# Patient Record
Sex: Male | Born: 1945
Health system: Southern US, Community
[De-identification: ages and names within clinical notes are randomized; demographics above are authoritative.]

## PROBLEM LIST (undated history)

## (undated) DIAGNOSIS — N400 Enlarged prostate without lower urinary tract symptoms: Secondary | ICD-10-CM

## (undated) DIAGNOSIS — S52502A Unspecified fracture of the lower end of left radius, initial encounter for closed fracture: Secondary | ICD-10-CM

## (undated) DIAGNOSIS — F419 Anxiety disorder, unspecified: Secondary | ICD-10-CM

## (undated) DIAGNOSIS — L309 Dermatitis, unspecified: Secondary | ICD-10-CM

## (undated) DIAGNOSIS — K219 Gastro-esophageal reflux disease without esophagitis: Secondary | ICD-10-CM

## (undated) DIAGNOSIS — G510 Bell's palsy: Secondary | ICD-10-CM

## (undated) DIAGNOSIS — I10 Essential (primary) hypertension: Secondary | ICD-10-CM

## (undated) DIAGNOSIS — K635 Polyp of colon: Secondary | ICD-10-CM

## (undated) DIAGNOSIS — I471 Supraventricular tachycardia, unspecified: Secondary | ICD-10-CM

## (undated) DIAGNOSIS — S61213A Laceration without foreign body of left middle finger without damage to nail, initial encounter: Secondary | ICD-10-CM

## (undated) DIAGNOSIS — G4733 Obstructive sleep apnea (adult) (pediatric): Secondary | ICD-10-CM

## (undated) DIAGNOSIS — B958 Unspecified staphylococcus as the cause of diseases classified elsewhere: Secondary | ICD-10-CM

## (undated) DIAGNOSIS — S42302A Unspecified fracture of shaft of humerus, left arm, initial encounter for closed fracture: Secondary | ICD-10-CM

## (undated) DIAGNOSIS — T847XXA Infection and inflammatory reaction due to other internal orthopedic prosthetic devices, implants and grafts, initial encounter: Secondary | ICD-10-CM

## (undated) DIAGNOSIS — I499 Cardiac arrhythmia, unspecified: Secondary | ICD-10-CM

## (undated) DIAGNOSIS — E785 Hyperlipidemia, unspecified: Secondary | ICD-10-CM

## (undated) DIAGNOSIS — T4145XA Adverse effect of unspecified anesthetic, initial encounter: Secondary | ICD-10-CM

## (undated) DIAGNOSIS — T8859XA Other complications of anesthesia, initial encounter: Secondary | ICD-10-CM

## (undated) DIAGNOSIS — I714 Abdominal aortic aneurysm, without rupture, unspecified: Secondary | ICD-10-CM

## (undated) DIAGNOSIS — S42309A Unspecified fracture of shaft of humerus, unspecified arm, initial encounter for closed fracture: Secondary | ICD-10-CM

## (undated) HISTORY — DX: Unspecified staphylococcus as the cause of diseases classified elsewhere: B95.8

## (undated) HISTORY — DX: Infection and inflammatory reaction due to other internal orthopedic prosthetic devices, implants and grafts, initial encounter: T84.7XXA

## (undated) HISTORY — DX: Supraventricular tachycardia, unspecified: I47.10

## (undated) HISTORY — PX: APPENDECTOMY: SHX54

## (undated) HISTORY — DX: Unspecified fracture of shaft of humerus, left arm, initial encounter for closed fracture: S42.302A

## (undated) HISTORY — DX: Bell's palsy: G51.0

## (undated) HISTORY — DX: Supraventricular tachycardia: I47.1

## (undated) HISTORY — DX: Gastro-esophageal reflux disease without esophagitis: K21.9

## (undated) HISTORY — DX: Benign prostatic hyperplasia without lower urinary tract symptoms: N40.0

## (undated) HISTORY — DX: Unspecified fracture of shaft of humerus, unspecified arm, initial encounter for closed fracture: S42.309A

## (undated) HISTORY — DX: Polyp of colon: K63.5

## (undated) HISTORY — DX: Hyperlipidemia, unspecified: E78.5

## (undated) HISTORY — PX: COLONOSCOPY W/ POLYPECTOMY: SHX1380

## (undated) HISTORY — DX: Obstructive sleep apnea (adult) (pediatric): G47.33

## (undated) HISTORY — DX: Abdominal aortic aneurysm, without rupture, unspecified: I71.40

## (undated) HISTORY — DX: Anxiety disorder, unspecified: F41.9

## (undated) HISTORY — DX: Essential (primary) hypertension: I10

## (undated) HISTORY — DX: Dermatitis, unspecified: L30.9

## (undated) HISTORY — PX: REPLANTATION THUMB: SUR1233

## (undated) HISTORY — DX: Unspecified fracture of the lower end of left radius, initial encounter for closed fracture: S52.502A

## (undated) HISTORY — DX: Laceration without foreign body of left middle finger without damage to nail, initial encounter: S61.213A

## (undated) HISTORY — PX: CHOLECYSTECTOMY: SHX55

---

## 1999-09-06 ENCOUNTER — Encounter: Payer: Self-pay | Admitting: *Deleted

## 1999-09-06 ENCOUNTER — Ambulatory Visit (HOSPITAL_COMMUNITY): Admission: RE | Admit: 1999-09-06 | Discharge: 1999-09-06 | Payer: Self-pay | Admitting: *Deleted

## 2001-08-12 ENCOUNTER — Ambulatory Visit (HOSPITAL_COMMUNITY): Admission: RE | Admit: 2001-08-12 | Discharge: 2001-08-12 | Payer: Self-pay | Admitting: Family Medicine

## 2001-08-12 ENCOUNTER — Encounter: Payer: Self-pay | Admitting: Family Medicine

## 2001-08-20 ENCOUNTER — Encounter: Admission: RE | Admit: 2001-08-20 | Discharge: 2001-08-20 | Payer: Self-pay | Admitting: Family Medicine

## 2001-08-20 ENCOUNTER — Encounter: Payer: Self-pay | Admitting: Family Medicine

## 2001-09-01 ENCOUNTER — Emergency Department (HOSPITAL_COMMUNITY): Admission: EM | Admit: 2001-09-01 | Discharge: 2001-09-01 | Payer: Self-pay | Admitting: Emergency Medicine

## 2001-09-04 ENCOUNTER — Encounter: Payer: Self-pay | Admitting: General Surgery

## 2001-09-05 ENCOUNTER — Encounter: Payer: Self-pay | Admitting: General Surgery

## 2001-09-05 ENCOUNTER — Encounter (INDEPENDENT_AMBULATORY_CARE_PROVIDER_SITE_OTHER): Payer: Self-pay

## 2001-09-06 ENCOUNTER — Encounter: Payer: Self-pay | Admitting: General Surgery

## 2001-09-06 ENCOUNTER — Inpatient Hospital Stay (HOSPITAL_COMMUNITY): Admission: AD | Admit: 2001-09-06 | Discharge: 2001-09-07 | Payer: Self-pay | Admitting: General Surgery

## 2001-09-14 DIAGNOSIS — G510 Bell's palsy: Secondary | ICD-10-CM

## 2001-09-14 HISTORY — DX: Bell's palsy: G51.0

## 2002-12-30 ENCOUNTER — Encounter (INDEPENDENT_AMBULATORY_CARE_PROVIDER_SITE_OTHER): Payer: Self-pay | Admitting: Specialist

## 2002-12-30 ENCOUNTER — Ambulatory Visit (HOSPITAL_COMMUNITY): Admission: RE | Admit: 2002-12-30 | Discharge: 2002-12-30 | Payer: Self-pay | Admitting: Gastroenterology

## 2004-11-18 ENCOUNTER — Ambulatory Visit: Admission: RE | Admit: 2004-11-18 | Discharge: 2004-11-18 | Payer: Self-pay | Admitting: Family Medicine

## 2004-11-23 ENCOUNTER — Observation Stay (HOSPITAL_COMMUNITY): Admission: EM | Admit: 2004-11-23 | Discharge: 2004-11-25 | Payer: Self-pay | Admitting: Emergency Medicine

## 2004-11-23 ENCOUNTER — Encounter (INDEPENDENT_AMBULATORY_CARE_PROVIDER_SITE_OTHER): Payer: Self-pay | Admitting: Specialist

## 2005-11-10 ENCOUNTER — Encounter: Admission: RE | Admit: 2005-11-10 | Discharge: 2005-11-10 | Payer: Self-pay | Admitting: Emergency Medicine

## 2006-11-08 ENCOUNTER — Observation Stay (HOSPITAL_COMMUNITY): Admission: EM | Admit: 2006-11-08 | Discharge: 2006-11-09 | Payer: Self-pay | Admitting: Emergency Medicine

## 2007-03-12 ENCOUNTER — Ambulatory Visit (HOSPITAL_COMMUNITY): Admission: RE | Admit: 2007-03-12 | Discharge: 2007-03-13 | Payer: Self-pay | Admitting: Orthopedic Surgery

## 2009-06-17 ENCOUNTER — Encounter: Admission: RE | Admit: 2009-06-17 | Discharge: 2009-06-17 | Payer: Self-pay | Admitting: Family Medicine

## 2009-09-13 ENCOUNTER — Encounter: Admission: RE | Admit: 2009-09-13 | Discharge: 2009-09-13 | Payer: Self-pay | Admitting: Family Medicine

## 2010-11-29 NOTE — Op Note (Signed)
NAME:  Tanner Garza, [place] NO.:  [id number]   MEDICAL RECORD NO.:  [id number]          PATIENT TYPE:  OIB   LOCATION:  5006                         FACILITY:  [facility]   PHYSICIAN:  Dionne Ano. Gramig III, M.D.DATE OF BIRTH:  Apr 17, 1946   DATE OF PROCEDURE:  DATE OF DISCHARGE:                               OPERATIVE REPORT   PREOPERATIVE DIAGNOSIS:  Saw injury to the right thumb with involvement  of the extensor and flexor tendons, bone, nail plate and nail bed with  significant soft tissue derangement.   POSTOPERATIVE DIAGNOSIS:  Saw injury to the right thumb with involvement  of the extensor and flexor tendons, bone, nail plate and nail bed with  significant soft tissue derangement.   PROCEDURE:  1. Irrigation and debridement of skin, subcutaneous tissue, tendon,      bone, nail bed and nail plate.  This was an excisional debridement      of nonviable tissue.  2. Open reduction and internal fixation of proximal phalanx fracture,      right thumb.  3. Open reduction and internal fixation of distal phalanx fracture,      right thumb.  4. Nail plate removal, right thumb.  5. Nail bed repair, right thumb.  6. Extensor pollicis longus tendon repair, right thumb.  7. Flexor pollicis longus tendon repair, zone 1 of right thumb.  8. Complex soft tissue closure, right thumb.  9. Stress radiography.   SURGEON:  Dominica Severin, MD   ASSISTANT:  Karie Chimera, PA-C   COMPLICATIONS:  None.   ANESTHESIA:  General   TOURNIQUET TIME:  Less than an hour.   ESTIMATED BLOOD LOSS:  Minimal.   INDICATIONS FOR PROCEDURE:  This patient is a 65 year old male who  presents with the above-mentioned diagnosis.  I have counseled him with  regard to the risks and benefits of surgery including the risk of  infection, bleeding, anesthesia, damage to normal structures and failure  of surgery to accomplish its intended goals of relieving symptoms and  restoring function.  With this in  mind, he desires to proceed.  All  questions have been encouraged and answered preoperatively.   OPERATIVE FINDINGS:  The patient had massive injury to the proximal and  distal phalanx of the right thumb, severe tendon injury, soft tissue  derangement, volar plate injury and disarray of the soft tissue.  He  required nail plate removal, nail bed repair, flexor and extensor  reconstruction and ORIF of the distal and proximal phalanx.  He  fortunately maintained via blood flow.   OPERATION IN DETAIL:  The patient was seen by myself and Anesthesia.  He  was consented.  Arm was marked and permit signed.  Antibiotics were  given.  He was then taken to the procedural suite and underwent smooth  induction of general anesthesia.  Following this, he was prepped and  draped in the usual sterile fashion with Betadine scrub and paint.  Once  this done, I then performed elevation of the arm and removed tacking  sutures which had been previously placed at an outside facility.  Once  this was  done, I then performed excision of skin, subcutaneous tissue,  bone, tendon and associated soft tissues.  At this time, I performed a  nail plate removal atraumatically with Therapist, nutritional.  This was removed  to my satisfaction without difficulty.  Following this, an excisional  debridement of structures was accomplished with 4 liters of saline.  Nonviable tissue was removed.  He had severe interarticular injury with  distal phalanx fracture and proximal phalanx fracture.  I curettaged the  proximal phalanx and treated this aggressively with I&D.  I also  debrided the distal phalanx and treat this aggressively also with I&D.  The patient had the flexor pollicis longus tendon retrieved and tagged  with FiberWire suture for repair.  Following this and placing 4 liters  of fluid in the area, I then performed re-creation of the bony anatomy.  Three 0.035 K-wires were preplaced and following this I then performed   ORIF of the intra-articular fracture about the distal phalanx.  This was  done with 0.035 K-wires x2 and achieved adequate purchase.  Following  this, attention was turned towards the middle phalanx which was treated  with Kirschner wire fixation as well.  I placed longitudinal Kirschner  wire engaging the proximal phalanx which also harnessed the distal  phalanx.  This immobilized the DIP joint.  Thus, both the proximal and  distal phalanx fractures were treated with pinning and stabilization.   At this time, I then turned attention towards the extensor apparatus.  The EPL was repaired with running and interrupted 4-0 FiberWire suture.  This reapproximated nicely.   Following this, I then performed flexor pollicis longus tendon repair to  the bone.  I had previously made bony drill holes and pre-tagged a 4-0  FiberWire suture which was placed through the bony tunnels.  The patient  tolerated this well.  The FPL, EPL and bony architecture was nicely  repaired.   Following this, I then performed repair of the sterile and germinal  matrix with fine chromic suture of the 6-0 and 7-0 variety.  The patient  tolerated this well.   Following this, I then performed a complex skin repair.  The dorsal and  volar skin was reapproximated with Prolene suture and this was a very  complex laceration closure.   Final copy x-rays looked excellent in the AP and lateral plane.  I was  pleased with this.   Stress radiography was performed revealing excellent position of all  structures.  I was pleased with this and the findings.  There were no  complicating features with his surgery.  The patient was dressed with  Adaptic, Xeroform gauze and finger splint as well as a small plaster of  Paris splint.  He tolerated the procedure well.  Marcaine was placed for  postop analgesia.  He will be monitored and continued as an inpatient in  the hospital.  We will plan for IV antibiotics, pain medicine, etc.   At  time of first postop visit, we will need AP and lateral x-rays and  referral with therapy for removable orthosis.  All questions have been  encouraged and answered.           ______________________________  Dionne Ano. Everlene Other, M.D.     Nash Mantis  D:  03/12/2007  T:  03/13/2007  Job:  [id number]

## 2010-12-02 NOTE — H&P (Signed)
NAME:  Tanner Garza, Tanner Garza             ACCOUNT NO.:  [id number]   MEDICAL RECORD NO.:  [id number]          PATIENT TYPE:  EMS   LOCATION:  MAJO                         FACILITY:  [facility]   PHYSICIAN:  Kela Millin, M.D.DATE OF BIRTH:  10/29/1945   DATE OF ADMISSION:  11/08/2006  DATE OF DISCHARGE:                              HISTORY & PHYSICAL   CHIEF COMPLAINT:  Chest pain.   HISTORY OF PRESENT ILLNESS:  The patient is a 65 year old white male  with past medical history significant for hypertension and tobacco abuse  who presents with complaints of chest pain.  He states that he was in  his usual state of health until this morning while at work when he began  experiencing chest pain.  He states that it was more like his heart  was thumping out of his chest, 4/10 in intensity, and lasted about 30  minutes.  He felt very dizzy thereafter and also with questionable  diaphoresis.  He says that for the most part the pain was radiating down  his left arm.  He denies nausea, vomiting, shortness of breath, and it  was non exertional.  He states that he had worn a Holter monitor for 21  days and he was told by his primary care physician that it did not show  any arrhythmias.  He does not know which cardiologist set him up for the  monitoring.  He denies cough, fevers, dysuria, abdominal pain, diarrhea,  melena and no hematochezia.   The patient was seen in the ER and point of care markers were negative.  EKG showed sinus tachycardia with T wave inversions in the inferior  leads.  A D-Dimer was done and noted to be elevated at 1.10.  A CT  angiogram ordered and pending at the time of this dictation.  He is  admitted to [facility] [place] for further evaluation and  management.   PAST MEDICAL HISTORY:  1. Stated above.  2. BPH.  3. Chronic dermatitis on extremities.   PAST SURGICAL HISTORY:  Status post appendectomy.   MEDICATIONS:  1. Avodart 0.5 mg daily.  2. Aspirin.  3.  Benicar - patient does not know the dose.  4. He does not recall the other medicine that he takes.   ALLERGIES:  No known drug allergies.   SOCIAL HISTORY:  Tobacco - one-half pack per day.  He denies alcohol.   FAMILY HISTORY:  One of his brothers had an MI at age 90.  He is  deceased of stomach cancer.  His father is deceased, had colon cancer  and dementia.  His mother has dementia.   REVIEW OF SYSTEMS:  As per HPI.  Other review of systems negative.   PHYSICAL EXAMINATION:  GENERAL APPEARANCE:  The patient is an obese,  older white male, pleasant, alert and appropriate in no apparent  distress.  VITAL SIGNS:  Temperature 98 with blood pressure 134/79, pulse 103,  respiratory rate 16, O2 sat is 96%.  HEENT:  PERRL.  EOMI.  Sclerae anicteric.  Moist mucous membranes.  No  oral exudates.  NECK:  Supple.  No adenopathy.  No thyromegaly.  No carotid bruits  appreciated.  CHEST:  Lungs with decreased breath sounds at the bases, otherwise clear  to auscultation.  CARDIOVASCULAR:  Regular rate and rhythm.  Normal S1, S2.  ABDOMEN:  Obese.  Bowel sounds present, nontender, nondistended, soft,  no masses palpable.  No organomegaly appreciated.  EXTREMITIES:  He has a papular pinpoint erythematous rash on the  anterior aspect of his lower extremities bilaterally and a similar area  on the lateral aspect of his right shoulder area.  (This is chronic.)  NEUROLOGIC:  Alert and oriented x3.  Cranial nerves II-XII grossly  intact.  Nonfocal exam.   LABORATORY DATA:  Sodium 138, potassium 4.2, chloride 105, BUN 25,  glucose 110.  His pH is 7.31, bicarbonate 27.4.  Hemoglobin 16.3,  hematocrit 48, white cell count 5.3, platelet count 202, neutrophil  count 77%.  Point of care markers are negative.  His creatinine is 1.2.  D-Dimer is 1.10.   ASSESSMENT/PLAN:  1. Chest pain - as discussed above, risk factors include age, obesity,      hypertension, tobacco abuse.  Will obtain serial cardiac  enzymes      and a fasting lipid profile.  Will place the patient on aspirin,      nitrates.  As noted above, he has had Holter monitoring done      outpatient.  Will follow and consult cardiology pending cardiac      enzymes as well as a CT angiogram ordered.  Will place on PPI to      cover for GI.  Will continue Benicar.  2. Hypertension - as above, continue Benicar.  3. Tobacco abuse - smoking cessation consult.  4. Benign prostatic hypertrophy - continue Avodart.      Kela Millin, M.D.  Electronically Signed     ACV/MEDQ  D:  11/08/2006  T:  11/08/2006  Job:  [id number]   cc:   Holley Bouche, M.D.

## 2010-12-02 NOTE — Op Note (Signed)
[place]  Patient:    Tanner Garza, Tanner Garza Visit Number: [id number] MRN: [id number]          Service Type: EMS Location: [facility] Attending Physician:  Hanley Seamen Dictated by:   Angelia Mould. Derrell Lolling, M.D. Proc. Date: 09/05/01 Admit Date:  09/01/2001 Discharge Date: 09/01/2001   CC:         Cheree Ditto, M.D.  Arvella Merles, M.D.   Operative Report  PREOPERATIVE DIAGNOSIS:  Chronic cholecystitis with cholelithiasis.  POSTOPERATIVE DIAGNOSIS:  Acute cholecystitis with cholelithiasis, umbilical hernia.  OPERATION PERFORMED: 1. Laparoscopic cholecystectomy with intraoperative cholangiogram. 2. Umbilical herniorrhaphy.  SURGEON:  Angelia Mould. Derrell Lolling, M.D.  ASSISTANT:  Anselm Pancoast. Zachery Dakins, M.D.  ANESTHESIA:  INDICATIONS FOR PROCEDURE:  The patient is a 65 year old white male with a several month history of reflux abdominal bloating.  Last week he awoke with moderately severe epigastric pain which lasted about 2 hours.  He was evaluated, had an ultrasound which showed a small contracted gallbladder containing stones with wall thickening suggesting acute inflammation.  Common bile duct was not dilated.  CBC and complete metabolic panel were normal last week.  He was scheduled for surgery.  He came to the hospital this morning with a temperature of 100.1 and his liver function tests were now elevated with total bilirubin of 2.0 and elevated enzymes.  He still was having no pain and was not tender.  He was operated upon semielectively.  OPERATIVE FINDINGS:  The gallbladder was very thick-walled, acutely inflamed and with extensive adhesions to the fundus, body and infundibulum of the gallbladder.  The cystic duct itself was very tiny but patent.  The cholangiogram was normal.  There was normal intrahepatic and extrahepatic bile ducts, no filling defect, no dilatation, and prompt flow of contrast into the duodenum.  The liver otherwise  appeared healthy.  Stomach and duodenum, small intestine and large intestine were grossly normal.  The patient had an umbilical hernia with a defect about 2 cm that was encountered as we were making our umbilical trocar site and this required repair at the end of the case.  This added about 15 or 20 minutes to the operation.  DESCRIPTION OF PROCEDURE:  Following the induction of general endotracheal anesthesia, the patients abdomen was prepped and draped in sterile fashion. 0.25% Marcaine with epinephrine was used as a local infiltration anesthetic. A transverse incision was made at the superior rim of the umbilicus. Dissection was carried down into subcutaneous tissues.  We encountered umbilical hernia with a defect in the fascia about 2 cm.  We were able to enter the peritoneal cavity under direct vision.  We inserted a 10 mm Hasson trocar and got a good air seal with a pursestring suture of 2-0 Vicryl suture. Pneumoperitoneum was created.  Video camera was inserted with visualization and with the findings as described above.  A 10 mm trocar was placed in the subxiphoid region and two 5 mm trocars placed in the right midabdomen.  We were able to peel some of the adhesions off the fundus of the gallbladder and then we were able to put a suction trocar into the fundus and aspirate some of the bile so the gallbladder would partially collapse and allow [place] to secure it with a grasper.  We then stripped the rest of the adhesions off slowly and carefully until we identified the infundibulum of the gallbladder.  We dissected out the cystic duct.  We inserted a cholangiogram catheter into the  cystic duct.  A cholangiogram was obtained using the C-arm and that was normal.  The bile ducts were not dilated.  There was normal anatomy.  No filling defect.  Prompt flow of contrast into the duodenum.  The cystic duct catheter was removed, the cystic duct secured with metal clips and divided. The cystic  artery was isolated, clipped and divided.  The gallbladder was then carefully dissected from its bed with electrocautery.  This was a somewhat tedious dissection because of the acute inflammation and thickening of the gallbladder.  Nevertheless we got the gallbladder completely removed, placed in a specimen bag and removed through the umbilical port.  The operative field was copiously irrigated with 2000 cc of saline.  Hemostasis was excellent.  At the completion of the case there was no bleeding and no bile leak whatsoever. The irrigation fluid was clear.  We removed the trocars under direct vision and there was no bleeding from the trocar sites.  The pneumoperitoneum was released.  To repair the umbilical hernia, we took out the Vicryl suture.  We extended the incision transversely until it was almost about 6 or 7 cm.  Subcutaneous tissue was debrided away from the anterior abdominal wall fascia.  The umbilicus was elevated until we had a good rim of fascia all the way around. The fascia was then closed transversely with five interrupted sutures of 0 Novofil.  The wound was irrigated with saline.  The repair appeared secure. The skin was closed with running subcuticular sutures of 4-0 Vicryl and Steri-Strips.  All other trocar sites were closed with subcuticular sutures of 4-0 Vicryl and Steri-Strips.  Clean bandages were placed.  The patient was taken to the recovery room in stable condition.  Estimated blood loss was about 30 to 40 cc.  Complications were none.  Sponge, needle and instrument counts were correct. Dictated by:   Angelia Mould. Derrell Lolling, M.D. Attending Physician:  Hanley Seamen DD:  09/05/01 TD:  09/05/01 Job: [id number]

## 2010-12-02 NOTE — Op Note (Signed)
   NAME:  Tanner Garza, Tanner Garza                       ACCOUNT NO.:  [id number]   MEDICAL RECORD NO.:  [id number]                   PATIENT TYPE:  AMB   LOCATION:  ENDO                                 FACILITY:  [facility]   PHYSICIAN:  Petra Kuba, M.D.                 DATE OF BIRTH:  1945-09-23   DATE OF PROCEDURE:  12/30/2002  DATE OF DISCHARGE:                                 OPERATIVE REPORT   PROCEDURE:  Esophagogastroduodenoscopy.   INDICATIONS:  Upper tract symptoms helped by pump inhibitors.  Want to do a  one time endoscopy to confirm a diagnosis, rule out Barrett's.  Consent was  signed after risks, benefits, methods, options were thoroughly discussed in  the office.   MEDICATIONS:  No additional medicines were used since this followed a  colonoscopy procedure.   DESCRIPTION OF PROCEDURE:  The video endoscope was inserted by direct  vision.  The proximal, mid and distal esophagus were normal.  In the distal  esophagus a small to medium hiatal hernia was seen. The scope was inserted  into the stomach, advanced through a normal antrum, normal pylorus into a  normal duodenal bulb and on to a normal second portion of the duodenum.  The  scope was withdrawn back to the bulb and a good look there ruled out ulcer  in that location.  Stomach was evaluated on retroflexion and straight  visualization and other than the hiatal hernia being confirmed in the  cardia, no abnormalities were seen.  Air was suctioned.  The scope was  slowly withdrawn and again a good look at the esophagus on slow withdrawal  was normal.  The scope was removed.  The patient tolerated the procedure  well.  There was no obvious immediate complication.   ENDOSCOPIC DIAGNOSES:  1. Small to medium hiatal hernia.  2. Otherwise normal esophagogastroduodenoscopy.   PLAN:  Continue pump inhibitors since they are helping.  Return care to Dr.  Tiburcio Pea.  Please see colonoscopy dictation for other workup plans and  recommendations.                                                Petra Kuba, M.D.    MEM/MEDQ  D:  12/30/2002  T:  12/30/2002  Job:  [id number]   cc:   Holley Bouche, M.D.  [address]  [place], [place] [address]  Fax: [phone]

## 2010-12-02 NOTE — H&P (Signed)
NAME:  Tanner Garza, Tanner Garza NO.:  [id number]   MEDICAL RECORD NO.:  [id number]          PATIENT TYPE:  EMS   LOCATION:  ED                           FACILITY:  [facility] [place]   PHYSICIAN:  Angelia Mould. Derrell Lolling, M.D.DATE OF BIRTH:  February 05, 1946   DATE OF ADMISSION:  11/23/2004  DATE OF DISCHARGE:                                HISTORY & PHYSICAL   CHIEF COMPLAINT:  Right lower quadrant pain.   HISTORY OF PRESENT ILLNESS:  This is a 65 year old white male who was  feeling reasonably well until late last night when he noticed some heaviness  and bloating in his lower abdomen.  He had some vague lower abdominal pain.  Mild nausea.  Did not vomit.  Has no fever.  Has not had any diarrhea.  He  states that this morning, the pain became more intense in the right lower  quadrant but has not been progressive throughout the day.  He had a bowel  movement today.  He took some milk of magnesia at 7:00 a.m., but that did  not make him feel better.  He is voiding normally.  He has not had any prior  similar problems.   He saw Dr. Holley Bouche.  He was sent to [facility].  A CT  scan was done.  By verbal report, this was consistent with early  appendicitis, but the diagnosis was not completely clear.  No other  abnormalities were seen.  He was sent to the [facility] ER for my  evaluation.   PAST MEDICAL HISTORY:  Laparoscopic cholecystectomy in 2002 by me.  Right  leg swelling one week ago.  He says the duplex scan was negative.  The cause  of his right leg swelling was unknown but has been mild.  He had an upper  endoscopy and colonoscopy in 2003.  He had a few colon polyps.  He is  scheduled to have this repeated in the next 12 months.  Hypertension.  Benign prostatic hypertrophy.  Hyperlipidemia.  Gastroesophageal reflux  disease.   CURRENT MEDICATIONS:  1.  Atenolol 50 mg p.o. b.i.d.  2.  Protonix 40 mg a day.  3.  Flomax 0.4 mg a day.  4.  Avodart 0.5 mg a day for  BPH.   DRUG ALLERGIES:  TELEPAQUE, otherwise negative.   SOCIAL HISTORY:  The patient is married.  Has one child.  Lives in [place].  He is a Copywriter, advertising, making false teeth.  He smokes one-half pack of  cigarettes per day.  He denies the use of alcohol.   FAMILY HISTORY:  Mother living.  Has dementia.  Father deceased.  Had  dementia and colon cancer.  Two brothers, one had an MI.   REVIEW OF SYSTEMS:  A 15-system review of systems is performed.  It is  noncontributory except as described above.   PHYSICAL EXAMINATION:  VITAL SIGNS:  Temp 97.9, pulse 86, respirations 20,  blood pressure 148/97.  GENERAL:  A pleasant middle-aged man in mild-to-moderate distress.  HEENT:  Eyes:  Sclerae are clear.  Extraocular movements are intact.  Ears,  nose, mouth,  throat, lips, tongue and oropharynx without gross lesions.  His  tongue is staying brown, presumably from tobacco.  NECK:  Soft and nontender.  No mass.  No jugular venous distention.  LUNGS:  Clear to auscultation.  No CVA tenderness.  HEART:  Regular rate and rhythm.  Radial, femoral, and dorsalis pedis pulses  are palpable bilaterally.  I really do not notice any peripheral edema.  ABDOMEN:  He has exquisite tenderness and guarding in the right lower  quadrant and is fairly soft.  Bowel sounds are hypoactive.  Well-healed  trocar sites.  Not distended.  The liver and spleen do not appear enlarged.  EXTREMITIES:  He moves all four extremities without pain or deformity.  NEUROLOGIC:  No gross motor or sensory deficits.   ADMISSION DATA:  I reviewed the CT scan with Dr. Elie Goody.  There is a  slight inflammatory change in the right lower quadrant, but it is not  clearly the appendix.  It may be an infarcted appendix epiploica.  It may be  early appendicitis but is not diagnostic.   LAB WORK:  A white blood cell count of 14,200, hemoglobin 15.8.  Complete  metabolic panel is entirely normal except for a glucose of 133.   Urinalysis  is entirely normal.   ASSESSMENT:  1.  Right lower quadrant pain:  Given the history, the degree of tenderness,      leukocytosis, and CT scan findings, I think the most likely diagnosis is      appendicitis, although other possibilities, including infarcted appendix      epiploica, diverticulitis, or other inflammatory bowel disease has to be      considered.  2.  Tobacco abuse.  3.  Hypertension.  4.  Benign prostatic hypertrophy.   PLAN:  1.  The patient will be taken to the operating room for diagnostic      laparoscopy and laparoscopic appendectomy.  2.  I have discussed the indications and details of the surgery with the      patient and his wife.  Risks and complications have been outlined and      include but are not limited to bleeding, infection, conversion to open      laparotomy, injury to adjacent organs such as the ureter, intestines, or      bladder with major reconstructive surgery, wound problems such as      infection or hernia, cardiac, pulmonary, and thromboembolic problems.      He seems to understand these issues well.  At this time, all of his      questions are answered.  He is in full agreement with this plan.      HMI/MEDQ  D:  11/23/2004  T:  11/23/2004  Job:  [id number]   cc:   Holley Bouche, M.D.  [address]  [place], [place] [address]  Fax: [phone]

## 2010-12-02 NOTE — Op Note (Signed)
NAME:  Tanner Garza, CUBERO                       ACCOUNT NO.:  [id number]   MEDICAL RECORD NO.:  [id number]                   PATIENT TYPE:  AMB   LOCATION:  ENDO                                 FACILITY:  [facility]   PHYSICIAN:  Petra Kuba, M.D.                 DATE OF BIRTH:  1946/02/03   DATE OF PROCEDURE:  12/30/2002  DATE OF DISCHARGE:                                 OPERATIVE REPORT   PROCEDURE:  Colonoscopy with polypectomy.   INDICATIONS:  Family history of colon cancer.  Due for colonic screening.   MEDICINES USED:  Demerol 80 mg, Versed 8 mg.   Consent was signed after risks, benefits, methods, and options thoroughly  discussed in the office.   PROCEDURE:  Rectal inspection was pertinent for external hemorrhoids.  Digital exam was negative.  The video colonoscope was inserted, easily  advanced around the colon to the cecum.  This did require some abdominal  pressure but no position changes.  No obvious abnormality was seen on  insertion.  The cecum was identified by the appendiceal orifice and the  ileocecal valve.  The scope was slowly withdrawn.  Prep was adequate.  There  was some liquid stool that required washing and suctioning.  On slow  withdrawal through the colon the cecum and the ascending were normal.  In  the hepatic flexure a small polyp was seen and was hot biopsied x1.  The  scope was further withdrawn.  Beginning at the splenic flexure and along the  left side, a few small polyps were seen.  Four were hot biopsied, two were  snared.  They included a hot biopsy in the rectum, both snares, I think, in  the sigmoid, and hot biopsies in both the sigmoid and the descending.  The  polyps that were snared were suctioned through the scope and collected in  the trap, and they were all put in a second container.  Once back in the  rectum the scope was then retroflexed, pertinent for some internal  hemorrhoids.  Anorectal pull-through confirmed the above.  The scope  was  straightened and reinserted a short way up the left side of the colon, air  was suctioned, and the scope removed.  The patient tolerated the procedure  well.  There was no obvious immediate complication.   ENDOSCOPIC DIAGNOSES:  1. Internal-external hemorrhoids.  2. Question of small hepatic flexure polyp, hot biopsied and put in     container #1.  3. Multiple small rectal, sigmoid, and descending polyps with two snares and     multiple hot biopsies, put in the second container.  4. Otherwise within normal limits to the cecum.    PLAN:  1. Await pathology to determine future colonic screening.  2. Happy to see back p.r.n., otherwise recommend yearly rectals and guaiacs     per Dr. Tiburcio Pea and continue workup with a one-time EGD for his  long-     standing upper tract symptoms.                                               Petra Kuba, M.D.    MEM/MEDQ  D:  12/30/2002  T:  12/30/2002  Job:  [id number]   cc:   Holley Bouche, M.D.  [address]  [place], [place] [address]  Fax: [phone]

## 2010-12-02 NOTE — Op Note (Signed)
NAME:  Tanner Garza, Tanner Garza             ACCOUNT NO.:  [id number]   MEDICAL RECORD NO.:  [id number]          PATIENT TYPE:  INP   LOCATION:  0104                         FACILITY:  [facility]   PHYSICIAN:  Angelia Mould. Derrell Lolling, M.D.DATE OF BIRTH:  May 25, 1946   DATE OF PROCEDURE:  11/23/2004  DATE OF DISCHARGE:                                 OPERATIVE REPORT   PREOPERATIVE DIAGNOSIS:  Acute appendicitis.   POSTOPERATIVE DIAGNOSIS:  Acute appendicitis.   OPERATION PERFORMED:  Laparoscopic appendectomy.   SURGEON:  Angelia Mould. Derrell Lolling, M.D.   OPERATIVE INDICATIONS:  This is a 65 year old white male who developed lower  abdominal heaviness and bloating last night.  Today, he has had more intense  pain in the right lower quadrant.  He has been a little bit nauseated but no  vomiting.  No fever.  No chills.  No diarrhea.  No prior similar history.  He saw Dr. Tiburcio Pea.  A CT scan was performed at [facility].  The  CT scan was read as possible early appendicitis.  I have reviewed the films,  and it is not clear, although there might be a slight inflammatory process  in the right lower quadrant.   On examination, he is found to have exquisite localized tenderness in the  right lower quadrant.  His white blood cell count is elevated to 14,200.  I  felt clinically the most likely diagnosis was appendicitis, and he is  brought to the operating room urgently for laparoscopy.   OPERATIVE FINDINGS:  The patient had severe, acute appendicitis.  The  appendix was necrotic.  There was no abscess or obvious perforation, but on  manipulating the appendix, it did rupture, spilling some purulent fluid.  The terminal ileum and cecum looked normal.   OPERATIVE TECHNIQUE:  Following the induction of general endotracheal  anesthesia, a Foley catheter was inserted.  Intravenous antibiotics were  given.  The patient's abdomen was prepped and draped in a sterile fashion.  Marcaine 0.5% with epinephrine was  used as a local infiltration anesthetic.  A 12 mm Optiview port was inserted in the left rectus sheath above the  umbilicus without any difficulty.  Pneumoperitoneum was created.  Video  camera was inserted with visualization and findings as described above.  A 5  mm trocar was placed in the left rectus sheath below the umbilicus, and a 12  mm port placed in the suprapubic area.  The patient was positioned in  Trendelenburg position and tilted to the left.  We were able to slowly  mobilize the small bowel off of the appendix and then get an Endoloop tie  around the appendix.  We divided some of the lateral peritoneal attachments  to help mobilize the appendix and the cecum medial.  We could then better  visualize the appendiceal mesentery, and we divided the mesentery of the  appendix with the harmonic scalpel in small steps until we could see exactly  where the appendix inserted onto the base of the cecum.  At this point, we  transected the appendix at the base of the cecum with an Endo-GIA stapling  device.  We placed a stapler perpendicular to the appendix across the base  of the cecum, taking just a little bit of the cecal wall with it.  Closed  the stapler, held it in place for about 45 seconds, and removed it.  This  provided a nice, secure staple line that was hemostatic.  There was no  bleeding.  The appendix was placed in a specimen bag and removed.   We then spent a great deal of time irrigating, perhaps 2000 cc out of the  pelvis and the right pericolic gutter, just to be sure that we did not leave  any particulate matter.  At the completion of this irrigation procedure,  everything looked perfectly clear, and there was no bleeding anywhere.  The  trocars were removed under direct vision.  There was no bleeding from the  trocar sites.  The pneumoperitoneum was released.  The fascia in the left  rectus sheath was closed with interrupted figure-of-eight sutures of 0  Vicryl.  The  bleeders were irrigated with saline, and the skin closed with  subcuticular sutures of 4-0 Monocryl and Steri-Strips.  Clean bandages were  placed, and the patient was taken to the recovery room in stable condition.  Estimated blood loss was about 25-30 cc.  Complications were none.  Sponge,  needle, and instrument counts were correct.      HMI/MEDQ  D:  11/23/2004  T:  11/23/2004  Job:  [id number]   cc:   Holley Bouche, M.D.  [address]  [place], [place] [address]  Fax: [phone]

## 2011-04-28 LAB — URINALYSIS, ROUTINE W REFLEX MICROSCOPIC
Bilirubin Urine: NEGATIVE
Glucose, UA: NEGATIVE
Hgb urine dipstick: NEGATIVE
Specific Gravity, Urine: 1.021
Urobilinogen, UA: 1
pH: 6

## 2011-04-28 LAB — CBC
MCV: 95.3
RBC: 4.28
RDW: 13.5

## 2011-04-28 LAB — BASIC METABOLIC PANEL
BUN: 16
CO2: 27
Chloride: 103
Creatinine, Ser: 0.91
Glucose, Bld: 105 — ABNORMAL HIGH
Potassium: 4
Sodium: 139

## 2011-10-04 DIAGNOSIS — H40009 Preglaucoma, unspecified, unspecified eye: Secondary | ICD-10-CM | POA: Diagnosis not present

## 2011-10-06 DIAGNOSIS — N400 Enlarged prostate without lower urinary tract symptoms: Secondary | ICD-10-CM | POA: Diagnosis not present

## 2011-10-06 DIAGNOSIS — K219 Gastro-esophageal reflux disease without esophagitis: Secondary | ICD-10-CM | POA: Diagnosis not present

## 2011-10-06 DIAGNOSIS — E785 Hyperlipidemia, unspecified: Secondary | ICD-10-CM | POA: Diagnosis not present

## 2011-10-06 DIAGNOSIS — M722 Plantar fascial fibromatosis: Secondary | ICD-10-CM | POA: Diagnosis not present

## 2011-10-06 DIAGNOSIS — F411 Generalized anxiety disorder: Secondary | ICD-10-CM | POA: Diagnosis not present

## 2011-10-06 DIAGNOSIS — I10 Essential (primary) hypertension: Secondary | ICD-10-CM | POA: Diagnosis not present

## 2011-10-10 DIAGNOSIS — F4310 Post-traumatic stress disorder, unspecified: Secondary | ICD-10-CM | POA: Diagnosis not present

## 2011-10-10 DIAGNOSIS — F40298 Other specified phobia: Secondary | ICD-10-CM | POA: Diagnosis not present

## 2011-11-29 DIAGNOSIS — M722 Plantar fascial fibromatosis: Secondary | ICD-10-CM | POA: Diagnosis not present

## 2012-01-26 DIAGNOSIS — M722 Plantar fascial fibromatosis: Secondary | ICD-10-CM | POA: Diagnosis not present

## 2012-03-08 DIAGNOSIS — M722 Plantar fascial fibromatosis: Secondary | ICD-10-CM | POA: Diagnosis not present

## 2012-03-15 DIAGNOSIS — Z0000 Encounter for general adult medical examination without abnormal findings: Secondary | ICD-10-CM | POA: Diagnosis not present

## 2012-03-15 DIAGNOSIS — Z125 Encounter for screening for malignant neoplasm of prostate: Secondary | ICD-10-CM | POA: Diagnosis not present

## 2012-03-15 DIAGNOSIS — F411 Generalized anxiety disorder: Secondary | ICD-10-CM | POA: Diagnosis not present

## 2012-03-15 DIAGNOSIS — I10 Essential (primary) hypertension: Secondary | ICD-10-CM | POA: Diagnosis not present

## 2012-03-15 DIAGNOSIS — Z23 Encounter for immunization: Secondary | ICD-10-CM | POA: Diagnosis not present

## 2012-03-15 DIAGNOSIS — E785 Hyperlipidemia, unspecified: Secondary | ICD-10-CM | POA: Diagnosis not present

## 2012-03-15 DIAGNOSIS — K219 Gastro-esophageal reflux disease without esophagitis: Secondary | ICD-10-CM | POA: Diagnosis not present

## 2012-03-15 DIAGNOSIS — Z1159 Encounter for screening for other viral diseases: Secondary | ICD-10-CM | POA: Diagnosis not present

## 2012-03-15 DIAGNOSIS — N400 Enlarged prostate without lower urinary tract symptoms: Secondary | ICD-10-CM | POA: Diagnosis not present

## 2012-03-29 DIAGNOSIS — H40059 Ocular hypertension, unspecified eye: Secondary | ICD-10-CM | POA: Diagnosis not present

## 2012-03-29 DIAGNOSIS — H02839 Dermatochalasis of unspecified eye, unspecified eyelid: Secondary | ICD-10-CM | POA: Diagnosis not present

## 2012-04-05 DIAGNOSIS — Z87891 Personal history of nicotine dependence: Secondary | ICD-10-CM | POA: Diagnosis not present

## 2012-05-31 DIAGNOSIS — Z23 Encounter for immunization: Secondary | ICD-10-CM | POA: Diagnosis not present

## 2012-07-26 DIAGNOSIS — H40059 Ocular hypertension, unspecified eye: Secondary | ICD-10-CM | POA: Diagnosis not present

## 2012-09-09 DIAGNOSIS — G4730 Sleep apnea, unspecified: Secondary | ICD-10-CM | POA: Diagnosis not present

## 2012-09-09 DIAGNOSIS — N318 Other neuromuscular dysfunction of bladder: Secondary | ICD-10-CM | POA: Diagnosis not present

## 2012-10-16 DIAGNOSIS — G4733 Obstructive sleep apnea (adult) (pediatric): Secondary | ICD-10-CM | POA: Diagnosis not present

## 2013-03-07 DIAGNOSIS — H40059 Ocular hypertension, unspecified eye: Secondary | ICD-10-CM | POA: Diagnosis not present

## 2013-03-07 DIAGNOSIS — H524 Presbyopia: Secondary | ICD-10-CM | POA: Diagnosis not present

## 2013-03-07 DIAGNOSIS — H5200 Hypermetropia, unspecified eye: Secondary | ICD-10-CM | POA: Diagnosis not present

## 2013-03-19 DIAGNOSIS — K219 Gastro-esophageal reflux disease without esophagitis: Secondary | ICD-10-CM | POA: Diagnosis not present

## 2013-03-19 DIAGNOSIS — Z0000 Encounter for general adult medical examination without abnormal findings: Secondary | ICD-10-CM | POA: Diagnosis not present

## 2013-03-19 DIAGNOSIS — E785 Hyperlipidemia, unspecified: Secondary | ICD-10-CM | POA: Diagnosis not present

## 2013-03-19 DIAGNOSIS — N400 Enlarged prostate without lower urinary tract symptoms: Secondary | ICD-10-CM | POA: Diagnosis not present

## 2013-03-19 DIAGNOSIS — G4733 Obstructive sleep apnea (adult) (pediatric): Secondary | ICD-10-CM | POA: Diagnosis not present

## 2013-03-19 DIAGNOSIS — F411 Generalized anxiety disorder: Secondary | ICD-10-CM | POA: Diagnosis not present

## 2013-03-19 DIAGNOSIS — I10 Essential (primary) hypertension: Secondary | ICD-10-CM | POA: Diagnosis not present

## 2013-03-19 DIAGNOSIS — Z125 Encounter for screening for malignant neoplasm of prostate: Secondary | ICD-10-CM | POA: Diagnosis not present

## 2013-04-16 DIAGNOSIS — I471 Supraventricular tachycardia: Secondary | ICD-10-CM | POA: Diagnosis not present

## 2013-04-16 DIAGNOSIS — I498 Other specified cardiac arrhythmias: Secondary | ICD-10-CM | POA: Diagnosis not present

## 2013-04-16 DIAGNOSIS — R002 Palpitations: Secondary | ICD-10-CM | POA: Diagnosis not present

## 2013-04-16 DIAGNOSIS — R079 Chest pain, unspecified: Secondary | ICD-10-CM | POA: Diagnosis not present

## 2013-04-16 DIAGNOSIS — I499 Cardiac arrhythmia, unspecified: Secondary | ICD-10-CM | POA: Diagnosis not present

## 2013-04-17 DIAGNOSIS — M549 Dorsalgia, unspecified: Secondary | ICD-10-CM | POA: Diagnosis not present

## 2013-04-17 DIAGNOSIS — I10 Essential (primary) hypertension: Secondary | ICD-10-CM | POA: Diagnosis not present

## 2013-04-17 DIAGNOSIS — I498 Other specified cardiac arrhythmias: Secondary | ICD-10-CM | POA: Diagnosis not present

## 2013-04-30 DIAGNOSIS — Z23 Encounter for immunization: Secondary | ICD-10-CM | POA: Diagnosis not present

## 2013-06-06 DIAGNOSIS — G4733 Obstructive sleep apnea (adult) (pediatric): Secondary | ICD-10-CM | POA: Diagnosis not present

## 2013-06-18 DIAGNOSIS — H40059 Ocular hypertension, unspecified eye: Secondary | ICD-10-CM | POA: Diagnosis not present

## 2013-07-16 DIAGNOSIS — J40 Bronchitis, not specified as acute or chronic: Secondary | ICD-10-CM | POA: Diagnosis not present

## 2013-08-18 DIAGNOSIS — G4733 Obstructive sleep apnea (adult) (pediatric): Secondary | ICD-10-CM | POA: Diagnosis not present

## 2013-08-20 DIAGNOSIS — H40059 Ocular hypertension, unspecified eye: Secondary | ICD-10-CM | POA: Diagnosis not present

## 2013-09-09 DIAGNOSIS — H40059 Ocular hypertension, unspecified eye: Secondary | ICD-10-CM | POA: Diagnosis not present

## 2013-09-25 DIAGNOSIS — M549 Dorsalgia, unspecified: Secondary | ICD-10-CM | POA: Diagnosis not present

## 2013-09-25 DIAGNOSIS — F411 Generalized anxiety disorder: Secondary | ICD-10-CM | POA: Diagnosis not present

## 2013-09-25 DIAGNOSIS — K219 Gastro-esophageal reflux disease without esophagitis: Secondary | ICD-10-CM | POA: Diagnosis not present

## 2013-09-25 DIAGNOSIS — I10 Essential (primary) hypertension: Secondary | ICD-10-CM | POA: Diagnosis not present

## 2013-09-25 DIAGNOSIS — E785 Hyperlipidemia, unspecified: Secondary | ICD-10-CM | POA: Diagnosis not present

## 2013-09-25 DIAGNOSIS — N400 Enlarged prostate without lower urinary tract symptoms: Secondary | ICD-10-CM | POA: Diagnosis not present

## 2013-11-18 DIAGNOSIS — G4733 Obstructive sleep apnea (adult) (pediatric): Secondary | ICD-10-CM | POA: Diagnosis not present

## 2013-11-21 DIAGNOSIS — D235 Other benign neoplasm of skin of trunk: Secondary | ICD-10-CM | POA: Diagnosis not present

## 2013-11-21 DIAGNOSIS — D234 Other benign neoplasm of skin of scalp and neck: Secondary | ICD-10-CM | POA: Diagnosis not present

## 2013-11-21 DIAGNOSIS — L570 Actinic keratosis: Secondary | ICD-10-CM | POA: Diagnosis not present

## 2013-11-21 DIAGNOSIS — L708 Other acne: Secondary | ICD-10-CM | POA: Diagnosis not present

## 2014-01-29 DIAGNOSIS — K1370 Unspecified lesions of oral mucosa: Secondary | ICD-10-CM | POA: Diagnosis not present

## 2014-03-05 ENCOUNTER — Other Ambulatory Visit: Payer: Self-pay | Admitting: Family Medicine

## 2014-03-05 ENCOUNTER — Ambulatory Visit
Admission: RE | Admit: 2014-03-05 | Discharge: 2014-03-05 | Disposition: A | Discharge status: Home / Self Care | Payer: Medicare Other | Source: Ambulatory Visit | Attending: Family Medicine | Admitting: Family Medicine

## 2014-03-05 DIAGNOSIS — R0789 Other chest pain: Secondary | ICD-10-CM

## 2014-03-05 DIAGNOSIS — R071 Chest pain on breathing: Secondary | ICD-10-CM | POA: Diagnosis not present

## 2014-03-17 DIAGNOSIS — H01009 Unspecified blepharitis unspecified eye, unspecified eyelid: Secondary | ICD-10-CM | POA: Diagnosis not present

## 2014-03-17 DIAGNOSIS — H524 Presbyopia: Secondary | ICD-10-CM | POA: Diagnosis not present

## 2014-03-17 DIAGNOSIS — H5200 Hypermetropia, unspecified eye: Secondary | ICD-10-CM | POA: Diagnosis not present

## 2014-03-17 DIAGNOSIS — H40059 Ocular hypertension, unspecified eye: Secondary | ICD-10-CM | POA: Diagnosis not present

## 2014-03-20 DIAGNOSIS — I10 Essential (primary) hypertension: Secondary | ICD-10-CM | POA: Diagnosis not present

## 2014-03-20 DIAGNOSIS — Z0000 Encounter for general adult medical examination without abnormal findings: Secondary | ICD-10-CM | POA: Diagnosis not present

## 2014-03-20 DIAGNOSIS — F411 Generalized anxiety disorder: Secondary | ICD-10-CM | POA: Diagnosis not present

## 2014-03-20 DIAGNOSIS — F17200 Nicotine dependence, unspecified, uncomplicated: Secondary | ICD-10-CM | POA: Diagnosis not present

## 2014-03-20 DIAGNOSIS — Z23 Encounter for immunization: Secondary | ICD-10-CM | POA: Diagnosis not present

## 2014-03-20 DIAGNOSIS — N400 Enlarged prostate without lower urinary tract symptoms: Secondary | ICD-10-CM | POA: Diagnosis not present

## 2014-03-20 DIAGNOSIS — E785 Hyperlipidemia, unspecified: Secondary | ICD-10-CM | POA: Diagnosis not present

## 2014-03-20 DIAGNOSIS — Z125 Encounter for screening for malignant neoplasm of prostate: Secondary | ICD-10-CM | POA: Diagnosis not present

## 2014-03-26 DIAGNOSIS — Z125 Encounter for screening for malignant neoplasm of prostate: Secondary | ICD-10-CM | POA: Diagnosis not present

## 2014-03-26 DIAGNOSIS — I10 Essential (primary) hypertension: Secondary | ICD-10-CM | POA: Diagnosis not present

## 2014-03-26 DIAGNOSIS — E785 Hyperlipidemia, unspecified: Secondary | ICD-10-CM | POA: Diagnosis not present

## 2014-05-18 ENCOUNTER — Other Ambulatory Visit: Payer: Self-pay | Admitting: Gastroenterology

## 2014-05-18 DIAGNOSIS — Z1211 Encounter for screening for malignant neoplasm of colon: Secondary | ICD-10-CM | POA: Diagnosis not present

## 2014-05-18 DIAGNOSIS — K635 Polyp of colon: Secondary | ICD-10-CM | POA: Diagnosis not present

## 2014-05-18 DIAGNOSIS — Z800 Family history of malignant neoplasm of digestive organs: Secondary | ICD-10-CM | POA: Diagnosis not present

## 2014-05-18 DIAGNOSIS — D123 Benign neoplasm of transverse colon: Secondary | ICD-10-CM | POA: Diagnosis not present

## 2014-05-20 DIAGNOSIS — Z23 Encounter for immunization: Secondary | ICD-10-CM | POA: Diagnosis not present

## 2014-07-06 DIAGNOSIS — R7989 Other specified abnormal findings of blood chemistry: Secondary | ICD-10-CM | POA: Diagnosis not present

## 2014-09-22 DIAGNOSIS — H40053 Ocular hypertension, bilateral: Secondary | ICD-10-CM | POA: Diagnosis not present

## 2014-11-23 DIAGNOSIS — G4733 Obstructive sleep apnea (adult) (pediatric): Secondary | ICD-10-CM | POA: Diagnosis not present

## 2014-11-23 DIAGNOSIS — Z720 Tobacco use: Secondary | ICD-10-CM | POA: Diagnosis not present

## 2014-12-05 ENCOUNTER — Emergency Department (HOSPITAL_COMMUNITY): Payer: Medicare Other

## 2014-12-05 ENCOUNTER — Encounter (HOSPITAL_COMMUNITY): Payer: Self-pay

## 2014-12-05 ENCOUNTER — Emergency Department (HOSPITAL_COMMUNITY)
Admission: EM | Admit: 2014-12-05 | Discharge: 2014-12-05 | Disposition: A | Discharge status: Home / Self Care | Payer: Medicare Other | Attending: Emergency Medicine | Admitting: Emergency Medicine

## 2014-12-05 ENCOUNTER — Other Ambulatory Visit (HOSPITAL_COMMUNITY): Payer: Self-pay

## 2014-12-05 DIAGNOSIS — K219 Gastro-esophageal reflux disease without esophagitis: Secondary | ICD-10-CM | POA: Diagnosis not present

## 2014-12-05 DIAGNOSIS — Z8669 Personal history of other diseases of the nervous system and sense organs: Secondary | ICD-10-CM | POA: Insufficient documentation

## 2014-12-05 DIAGNOSIS — F419 Anxiety disorder, unspecified: Secondary | ICD-10-CM | POA: Insufficient documentation

## 2014-12-05 DIAGNOSIS — Z7982 Long term (current) use of aspirin: Secondary | ICD-10-CM | POA: Diagnosis not present

## 2014-12-05 DIAGNOSIS — S299XXA Unspecified injury of thorax, initial encounter: Secondary | ICD-10-CM | POA: Diagnosis not present

## 2014-12-05 DIAGNOSIS — S42302A Unspecified fracture of shaft of humerus, left arm, initial encounter for closed fracture: Secondary | ICD-10-CM

## 2014-12-05 DIAGNOSIS — I10 Essential (primary) hypertension: Secondary | ICD-10-CM | POA: Diagnosis not present

## 2014-12-05 DIAGNOSIS — S52592A Other fractures of lower end of left radius, initial encounter for closed fracture: Secondary | ICD-10-CM | POA: Diagnosis not present

## 2014-12-05 DIAGNOSIS — T148 Other injury of unspecified body region: Secondary | ICD-10-CM | POA: Diagnosis not present

## 2014-12-05 DIAGNOSIS — Z720 Tobacco use: Secondary | ICD-10-CM | POA: Insufficient documentation

## 2014-12-05 DIAGNOSIS — S42352A Displaced comminuted fracture of shaft of humerus, left arm, initial encounter for closed fracture: Secondary | ICD-10-CM | POA: Diagnosis not present

## 2014-12-05 DIAGNOSIS — Z79899 Other long term (current) drug therapy: Secondary | ICD-10-CM | POA: Insufficient documentation

## 2014-12-05 DIAGNOSIS — Y9389 Activity, other specified: Secondary | ICD-10-CM | POA: Insufficient documentation

## 2014-12-05 DIAGNOSIS — Z880 Allergy status to penicillin: Secondary | ICD-10-CM | POA: Insufficient documentation

## 2014-12-05 DIAGNOSIS — S42322A Displaced transverse fracture of shaft of humerus, left arm, initial encounter for closed fracture: Secondary | ICD-10-CM | POA: Diagnosis not present

## 2014-12-05 DIAGNOSIS — S63253A Unspecified dislocation of left middle finger, initial encounter: Secondary | ICD-10-CM | POA: Diagnosis not present

## 2014-12-05 DIAGNOSIS — Z872 Personal history of diseases of the skin and subcutaneous tissue: Secondary | ICD-10-CM | POA: Diagnosis not present

## 2014-12-05 DIAGNOSIS — S63259A Unspecified dislocation of unspecified finger, initial encounter: Secondary | ICD-10-CM

## 2014-12-05 DIAGNOSIS — Z86010 Personal history of colonic polyps: Secondary | ICD-10-CM | POA: Insufficient documentation

## 2014-12-05 DIAGNOSIS — S52502A Unspecified fracture of the lower end of left radius, initial encounter for closed fracture: Secondary | ICD-10-CM | POA: Diagnosis not present

## 2014-12-05 DIAGNOSIS — Y999 Unspecified external cause status: Secondary | ICD-10-CM | POA: Diagnosis not present

## 2014-12-05 DIAGNOSIS — Z8639 Personal history of other endocrine, nutritional and metabolic disease: Secondary | ICD-10-CM | POA: Insufficient documentation

## 2014-12-05 DIAGNOSIS — S52591A Other fractures of lower end of right radius, initial encounter for closed fracture: Secondary | ICD-10-CM | POA: Diagnosis not present

## 2014-12-05 DIAGNOSIS — W11XXXA Fall on and from ladder, initial encounter: Secondary | ICD-10-CM | POA: Diagnosis not present

## 2014-12-05 DIAGNOSIS — S42392A Other fracture of shaft of left humerus, initial encounter for closed fracture: Secondary | ICD-10-CM | POA: Insufficient documentation

## 2014-12-05 DIAGNOSIS — S4992XA Unspecified injury of left shoulder and upper arm, initial encounter: Secondary | ICD-10-CM | POA: Diagnosis present

## 2014-12-05 DIAGNOSIS — N400 Enlarged prostate without lower urinary tract symptoms: Secondary | ICD-10-CM | POA: Insufficient documentation

## 2014-12-05 DIAGNOSIS — M542 Cervicalgia: Secondary | ICD-10-CM | POA: Diagnosis not present

## 2014-12-05 DIAGNOSIS — Y929 Unspecified place or not applicable: Secondary | ICD-10-CM | POA: Diagnosis not present

## 2014-12-05 DIAGNOSIS — S63283A Dislocation of proximal interphalangeal joint of left middle finger, initial encounter: Secondary | ICD-10-CM | POA: Diagnosis not present

## 2014-12-05 DIAGNOSIS — S52532A Colles' fracture of left radius, initial encounter for closed fracture: Secondary | ICD-10-CM | POA: Diagnosis not present

## 2014-12-05 LAB — CBC WITH DIFFERENTIAL/PLATELET
Basophils Absolute: 0.1 10*3/uL (ref 0.0–0.1)
Basophils Relative: 1 % (ref 0–1)
EOS PCT: 2 % (ref 0–5)
Eosinophils Absolute: 0.1 10*3/uL (ref 0.0–0.7)
HCT: 41.1 % (ref 39.0–52.0)
Hemoglobin: 14.6 g/dL (ref 13.0–17.0)
LYMPHS ABS: 1.2 10*3/uL (ref 0.7–4.0)
LYMPHS PCT: 18 % (ref 12–46)
MCH: 33.6 pg (ref 26.0–34.0)
MCHC: 35.5 g/dL (ref 30.0–36.0)
MCV: 94.5 fL (ref 78.0–100.0)
MONO ABS: 0.4 10*3/uL (ref 0.1–1.0)
Monocytes Relative: 5 % (ref 3–12)
Neutro Abs: 5 10*3/uL (ref 1.7–7.7)
Neutrophils Relative %: 74 % (ref 43–77)
PLATELETS: 187 10*3/uL (ref 150–400)
RBC: 4.35 MIL/uL (ref 4.22–5.81)
RDW: 13.4 % (ref 11.5–15.5)
WBC: 6.7 10*3/uL (ref 4.0–10.5)

## 2014-12-05 LAB — BASIC METABOLIC PANEL
ANION GAP: 10 (ref 5–15)
BUN: 17 mg/dL (ref 6–20)
CO2: 24 mmol/L (ref 22–32)
CREATININE: 1.23 mg/dL (ref 0.61–1.24)
Calcium: 9.2 mg/dL (ref 8.9–10.3)
Chloride: 105 mmol/L (ref 101–111)
GFR, EST NON AFRICAN AMERICAN: 59 mL/min — AB (ref >60)
GLUCOSE: 146 mg/dL — AB (ref 65–99)
POTASSIUM: 3.6 mmol/L (ref 3.5–5.1)
SODIUM: 139 mmol/L (ref 135–145)

## 2014-12-05 MED ORDER — PROPOFOL 10 MG/ML IV BOLUS
0.5000 mg/kg | Freq: Once | INTRAVENOUS | Status: AC
  Administered 2014-12-05: 62.4 mg via INTRAVENOUS
  Filled 2014-12-05: qty 20

## 2014-12-05 MED ORDER — HYDROMORPHONE HCL 1 MG/ML IJ SOLN: 1.0000 mg | Freq: Once | INTRAMUSCULAR | Status: DC

## 2014-12-05 MED ORDER — CLINDAMYCIN HCL 300 MG PO CAPS
300.0000 mg | ORAL_CAPSULE | Freq: Once | ORAL | Status: AC
  Administered 2014-12-05: 300 mg via ORAL
  Filled 2014-12-05: qty 1

## 2014-12-05 MED ORDER — AMOXICILLIN-POT CLAVULANATE 875-125 MG PO TABS: 1.0000 | ORAL_TABLET | Freq: Two times a day (BID) | ORAL | Status: DC

## 2014-12-05 MED ORDER — SULFAMETHOXAZOLE-TRIMETHOPRIM 800-160 MG PO TABS: 1.0000 | ORAL_TABLET | Freq: Two times a day (BID) | ORAL | Status: DC

## 2014-12-05 MED ORDER — MORPHINE SULFATE 4 MG/ML IJ SOLN
4.0000 mg | Freq: Once | INTRAMUSCULAR | Status: AC
  Administered 2014-12-05: 4 mg via INTRAVENOUS
  Filled 2014-12-05: qty 1

## 2014-12-05 MED ORDER — PROPOFOL 10 MG/ML IV BOLUS
INTRAVENOUS | Status: AC | PRN
  Administered 2014-12-05 (×2): 20 mg via INTRAVENOUS
  Administered 2014-12-05: 10 mg via INTRAVENOUS
  Administered 2014-12-05: 20 mg via INTRAVENOUS
  Administered 2014-12-05: 40 mg via INTRAVENOUS
  Administered 2014-12-05: 10 mg via INTRAVENOUS
  Administered 2014-12-05: 40 mg via INTRAVENOUS
  Administered 2014-12-05: 10 mg via INTRAVENOUS
  Administered 2014-12-05: 200 mg via INTRAVENOUS
  Administered 2014-12-05: 15 mg via INTRAVENOUS
  Administered 2014-12-05: 60 mg via INTRAVENOUS
  Administered 2014-12-05 (×2): 10 mg via INTRAVENOUS
  Administered 2014-12-05 (×2): 20 mg via INTRAVENOUS

## 2014-12-05 MED ORDER — ONDANSETRON HCL 4 MG/2ML IJ SOLN
4.0000 mg | Freq: Four times a day (QID) | INTRAMUSCULAR | Status: DC | PRN
  Administered 2014-12-05: 4 mg via INTRAVENOUS
  Filled 2014-12-05: qty 2

## 2014-12-05 MED ORDER — OXYCODONE-ACETAMINOPHEN 5-325 MG PO TABS: 1.0000 | ORAL_TABLET | ORAL | Status: DC | PRN

## 2014-12-05 MED ORDER — CLINDAMYCIN HCL 150 MG PO CAPS: 300.0000 mg | ORAL_CAPSULE | Freq: Three times a day (TID) | ORAL | Status: DC

## 2014-12-05 MED ORDER — PROPOFOL 10 MG/ML IV BOLUS
INTRAVENOUS | Status: AC
  Filled 2014-12-05: qty 20

## 2014-12-05 MED ORDER — SODIUM CHLORIDE 0.9 % IV BOLUS (SEPSIS)
1000.0000 mL | Freq: Once | INTRAVENOUS | Status: AC
Start: 2014-12-05 — End: 2014-12-05
  Administered 2014-12-05: 1000 mL via INTRAVENOUS

## 2014-12-05 NOTE — ED Notes (Addendum)
Pt was on ladder cutting tree limbs when the ladder fell out from under him.  Pt did not loose conciousness but has obvious deformity to left shoulder and wrist. Radial pulses WNL.

## 2014-12-05 NOTE — Discharge Instructions (Signed)

## 2014-12-05 NOTE — ED Provider Notes (Signed)
CSN: [id number]     Arrival date & time 12/05/14  1323 History   First MD Initiated Contact with Patient 12/05/14 1327     Chief Complaint  Patient presents with  . Fall     HPI Patient reports falling from a ladder when he was attempting to cut down a branch.  He states the ladder broke and he fell towards his left side.  He presents with deformity and severe pain in his left hand, left wrist, left upper arm.  He denies neck pain.  No chest pain shortness breath.  Denies abdominal pain.  Denies lower extremity pain.  No hip pain.  No head injury.  No use of anticoagulants.  Pain is moderate to severe in severity and located in his left arm and worse with palpation and movement.  Denies numbness or tingling of his left hand.   Past Medical History  Diagnosis Date  . Hypertension   . BPH (benign prostatic hyperplasia)   . Anxiety   . Esophageal reflux   . Bell's palsy 09/2001  . Hyperlipidemia   . Eczema   . Colon polyp   . OSA (obstructive sleep apnea)   . SVT (supraventricular tachycardia)    History reviewed. No pertinent past surgical history. History reviewed. No pertinent family history. History  Substance Use Topics  . Smoking status: Current Every Day Smoker  . Smokeless tobacco: Not on file  . Alcohol Use: Yes    Review of Systems  All other systems reviewed and are negative.     Allergies  Atenolol; Chantix; Dye fdc red; Hctz; and Penicillins  Home Medications   Prior to Admission medications   Medication Sig Start Date End Date Taking? Authorizing Provider  ALPRAZolam Duanne Moron) 0.5 MG tablet Take 0.5 mg by mouth at bedtime as needed for anxiety.    Historical Provider, MD  amLODipine (NORVASC) 5 MG tablet Take 5 mg by mouth daily.    Historical Provider, MD  aspirin 325 MG tablet Take 325 mg by mouth daily.    Historical Provider, MD  finasteride (PROSCAR) 5 MG tablet Take 5 mg by mouth daily.    Historical Provider, MD  gabapentin (NEURONTIN) 300 MG capsule  Take 300 mg by mouth 3 (three) times daily.    Historical Provider, MD  losartan (COZAAR) 100 MG tablet Take 100 mg by mouth daily.    Historical Provider, MD  Lutein 6 MG TABS Take by mouth.    Historical Provider, MD  Multiple Vitamin (MULTIVITAMIN) tablet Take 1 tablet by mouth daily.    Historical Provider, MD  OXYGEN-HELIUM IN Inhale into the lungs. CPAP    Historical Provider, MD  pantoprazole (PROTONIX) 40 MG tablet Take 40 mg by mouth daily.    Historical Provider, MD   BP 94/69 mmHg  Pulse 70  Temp(Src) 97.6 F (36.4 C) (Oral)  Resp 16  Ht 6' (1.829 m)  Wt 275 lb (124.739 kg)  BMI 37.29 kg/m2  SpO2 96% Physical Exam  Constitutional: He is oriented to person, place, and time. He appears well-developed and well-nourished.  HENT:  Head: Normocephalic and atraumatic.  Eyes: EOM are normal.  Neck: Normal range of motion. Neck supple.  C-spine nontender.  C-spine cleared by Nexus criteria.  Cardiovascular: Normal rate, regular rhythm, normal heart sounds and intact distal pulses.   Pulmonary/Chest: Effort normal and breath sounds normal. No respiratory distress. He exhibits no tenderness.  Abdominal: Soft. He exhibits no distension. There is no tenderness. There is no  rebound and no guarding.  Musculoskeletal: Normal range of motion.  Obvious deformity of his mid left upper arm.  No open component.  Consistent with a humerus fracture.  Patient with range of motion that is normal in his left elbow.  Patient with obvious deformity and crepitus of his left wrist.  Patient with 2 dislocated fingers.  The left index finger has a persistent dislocation at the DIP joint.  The left middle finger has a dislocation at the PIP joint.  There is a small laceration on the volar surface at the level of the left middle finger DIP joint.  No bone is exposed at this level.  Full range of motion bilateral knees and hips.  Normal range of motion of right upper extremity.  Neurological: He is alert and  oriented to person, place, and time.  Skin: Skin is warm and dry.  Psychiatric: He has a normal mood and affect. Judgment normal.  Nursing note and vitals reviewed.   ED Course  Procedures (including critical care time)  Procedural sedation Performed by: Hoy Morn Consent: Verbal consent obtained. Risks and benefits: risks, benefits and alternatives were discussed Required items: required blood products, implants, devices, and special equipment available Patient identity confirmed: arm band and provided demographic data Time out: Immediately prior to procedure a "time out" was called to verify the correct patient, procedure, equipment, support staff and site/side marked as required. Sedation type: moderate (conscious) sedation NPO time confirmed and considered Sedatives: PROPOFOL Physician Time at Bedside: 20 Vitals: Vital signs were monitored during sedation. Cardiac Monitor, pulse oximeter Patient tolerance: Patient tolerated the procedure well with no immediate complications. Comments: Pt with uneventful recovery. Returned to pre-procedural sedation baseline  Reduction of dislocation Performed by: Hoy Morn Consent: Verbal consent obtained. Risks and benefits: risks, benefits and alternatives were discussed Consent given by: patient Required items: required blood products, implants, devices, and special equipment available Time out: Immediately prior to procedure a "time out" was called to verify the correct patient, procedure, equipment, support staff and site/side marked as required. Patient sedated: no Vitals: Vital signs were monitored during sedation. Patient tolerance: Patient tolerated the procedure well with no immediate complications. Joint: left middle finger PIP joint Reduction technique: manipulation     Labs Review Labs Reviewed  BASIC METABOLIC PANEL - Abnormal; Notable for the following:    Glucose, Bld 146 (*)    GFR calc non Af Amer 59 (*)     All other components within normal limits  CBC WITH DIFFERENTIAL/PLATELET    Imaging Review Dg Chest 1 View  12/05/2014   CLINICAL DATA:  Fall 15 feet from ladder  EXAM: CHEST  1 VIEW  COMPARISON:  None.  FINDINGS: Lungs are clear.  No pleural effusion or pneumothorax.  The heart is top-normal in size.  IMPRESSION: No evidence of acute cardiopulmonary disease.   Electronically Signed   By: Julian Hy M.D.   On: 12/05/2014 15:21   Dg Forearm Left  12/05/2014   CLINICAL DATA:  Fall 15 feet from ladder. Deformity and left upper arm and wrist.  EXAM: LEFT FOREARM - 2 VIEW  COMPARISON:  None.  FINDINGS: Comminuted common displaced fracture noted through the distal left radius. Fracture fragments and carpal bones are displaced posteriorly 1 shaft width. No additional forearm abnormality.  IMPRESSION: Highly comminuted, posteriorly displaced distal left radial fracture.   Electronically Signed   By: Rolm Baptise M.D.   On: 12/05/2014 15:19   Dg Wrist Complete Left  12/05/2014  CLINICAL DATA:  Fall from ladder onto left side. Deformity of wrist.  EXAM: LEFT WRIST - COMPLETE 3+ VIEW  COMPARISON:  None.  FINDINGS: There is a comminuted distal left radial fracture. Fracture fragments and carpal bones displaced posteriorly approximately 1 shaft width. No visible ulnar abnormality.  IMPRESSION: Severely comminuted, posteriorly displaced distal left radial fracture.   Electronically Signed   By: Rolm Baptise M.D.   On: 12/05/2014 15:17   Dg Humerus Left  12/05/2014   CLINICAL DATA:  Patient trimming 3 days. Fall from ladder. Initial encounter.  EXAM: LEFT HUMERUS - 2+ VIEW  COMPARISON:  None.  FINDINGS: There is a displaced oblique fracture through the mid diaphysis of the left humerus. No definite evidence for associated acute fractures within the humerus.  IMPRESSION: Comminuted displaced mid left humeral diaphysis fracture.   Electronically Signed   By: Lovey Newcomer M.D.   On: 12/05/2014 15:11   Dg Hand  Complete Left  12/05/2014   CLINICAL DATA:  Left index finger pain and deformity following a 15 foot fall off a ladder while trimming tree limbs. Initial encounter.  EXAM: LEFT HAND - COMPLETE 3+ VIEW  COMPARISON:  Wrist radiographs obtained at the same time.  FINDINGS: Dorsal dislocation of the second distal phalanx relative to the proximal phalanx at the DIP joint. No visible finger fracture. There is a comminuted, impacted fracture of the distal radial metaphysis and epiphysis with 1.5 shaft width of dorsal displacement of the distal fragment and remainder of the wrist and hand and overlapping of the fragments. The distal ulna appears intact.  IMPRESSION: 1. Dorsal dislocation at the second DIP joint without fracture. 2. Comminuted and markedly displaced distal radius fracture.   Electronically Signed   By: Claudie Revering M.D.   On: 12/05/2014 15:23  I personally reviewed the imaging tests through PACS system I reviewed available ER/hospitalization records through the EMR    EKG Interpretation None      MDM   Final diagnoses:  Fracture of left humerus, closed, initial encounter  Radius distal fracture, left, closed, initial encounter  Dislocated finger, initial encounter    C Spine cleared by Nexus criteria.  Chest and abdomen are benign.  No indication for imaging of the head.  No use of anticoagulants.  I discussed the case with Dr.Xu regarding his complex left upper extremity injuries.  I provided procedural sedation while Dr. Erlinda Hong performed reduction of the left humerus, left radius, dislocation of the left index finger.  Question as to whether or not the left middle finger could have an open component.  This was washed out the bedside.  Antibacterial ointment was placed.  The patient is placed on clindamycin secondary to penicillin allergy.  He'll be seen in the orthopedic office in 2 days for wound check of his left middle finger.  Operative repair of his fractures will be planned for  Wednesday by Dr. Donalda Ewings, MD 12/05/14 [phone]

## 2014-12-05 NOTE — Sedation Documentation (Signed)
All reductions successful bu Xu MD.  Pt was placed in long arm cast by MD and ortho tech.  Family updated and brought back to room.

## 2014-12-05 NOTE — ED Notes (Signed)
Called Ortho Tech. For Dr. Ralene Bathe-

## 2014-12-05 NOTE — Progress Notes (Signed)
Orthopedic Tech Progress Note Patient Details:  Tanner Garza [facility] Feb 13, 1946 [id number] Sedation with reduction of humerus, radius, and 2nd and 3rd digits. Plaster sugartong with coaptation applied to LUE. Sling provided. Ortho Devices Type of Ortho Device: Coapt, Ace wrap, Sugartong splint Ortho Device/Splint Location: LUE Ortho Device/Splint Interventions: Application   Asia R Thompson 12/05/2014, 4:59 PM

## 2014-12-05 NOTE — Consult Note (Signed)
ORTHOPAEDIC CONSULTATION  REQUESTING PHYSICIAN: Jola Schmidt, MD  Chief Complaint: LUE injury  HPI: Tanner Garza is a 69 y.o. male who fell from a ladder was brought in by EMS for LUE injury.  Denies LOC, neck pain, abd pain, pelvic pain.  Patient has closed transverse humerus fx, distal radius fx, left index finger DIP dislocation.  Ortho consulted.  Past Medical History  Diagnosis Date  . Hypertension   . BPH (benign prostatic hyperplasia)   . Anxiety   . Esophageal reflux   . Bell's palsy 09/2001  . Hyperlipidemia   . Eczema   . Colon polyp   . OSA (obstructive sleep apnea)   . SVT (supraventricular tachycardia)    History reviewed. No pertinent past surgical history. History   Social History  . Marital Status: Married    Spouse Name: N/A  . Number of Children: N/A  . Years of Education: N/A   Social History Main Topics  . Smoking status: Current Every Day Smoker  . Smokeless tobacco: Not on file  . Alcohol Use: Yes  . Drug Use: No  . Sexual Activity: Not on file   Other Topics Concern  . None   Social History Narrative   History reviewed. No pertinent family history. Allergies  Allergen Reactions  . Atenolol     bradycardia  . Chantix [Varenicline]     Sedation   . Dye Fdc Red [Red Dye]     X ray dye rash  . Hctz [Hydrochlorothiazide]     Bradycardia   . Penicillins     Tongue swelling   Prior to Admission medications   Medication Sig Start Date End Date Taking? Authorizing Provider  ALPRAZolam Duanne Moron) 0.5 MG tablet Take 0.5 mg by mouth at bedtime as needed for anxiety.   Yes Historical Provider, MD  amLODipine (NORVASC) 5 MG tablet Take 5 mg by mouth daily.   Yes Historical Provider, MD  aspirin 325 MG tablet Take 325 mg by mouth daily.   Yes Historical Provider, MD  finasteride (PROSCAR) 5 MG tablet Take 5 mg by mouth daily.   Yes Historical Provider, MD  gabapentin (NEURONTIN) 300 MG capsule Take 300 mg by mouth 3 (three) times daily.    Yes Historical Provider, MD  losartan (COZAAR) 100 MG tablet Take 100 mg by mouth daily.   Yes Historical Provider, MD  Lutein 6 MG TABS Take by mouth.   Yes Historical Provider, MD  Multiple Vitamin (MULTIVITAMIN) tablet Take 1 tablet by mouth daily.   Yes Historical Provider, MD  OXYGEN-HELIUM IN Inhale into the lungs. CPAP   Yes Historical Provider, MD  pantoprazole (PROTONIX) 40 MG tablet Take 40 mg by mouth daily.   Yes Historical Provider, MD   Dg Chest 1 View  12/05/2014   CLINICAL DATA:  Fall 15 feet from ladder  EXAM: CHEST  1 VIEW  COMPARISON:  None.  FINDINGS: Lungs are clear.  No pleural effusion or pneumothorax.  The heart is top-normal in size.  IMPRESSION: No evidence of acute cardiopulmonary disease.   Electronically Signed   By: Julian Hy M.D.   On: 12/05/2014 15:21   Dg Forearm Left  12/05/2014   CLINICAL DATA:  Fall 15 feet from ladder. Deformity and left upper arm and wrist.  EXAM: LEFT FOREARM - 2 VIEW  COMPARISON:  None.  FINDINGS: Comminuted common displaced fracture noted through the distal left radius. Fracture fragments and carpal bones are displaced posteriorly 1 shaft width. No additional  forearm abnormality.  IMPRESSION: Highly comminuted, posteriorly displaced distal left radial fracture.   Electronically Signed   By: Rolm Baptise M.D.   On: 12/05/2014 15:19   Dg Wrist Complete Left  12/05/2014   CLINICAL DATA:  Fall from ladder onto left side. Deformity of wrist.  EXAM: LEFT WRIST - COMPLETE 3+ VIEW  COMPARISON:  None.  FINDINGS: There is a comminuted distal left radial fracture. Fracture fragments and carpal bones displaced posteriorly approximately 1 shaft width. No visible ulnar abnormality.  IMPRESSION: Severely comminuted, posteriorly displaced distal left radial fracture.   Electronically Signed   By: Rolm Baptise M.D.   On: 12/05/2014 15:17   Dg Humerus Left  12/05/2014   CLINICAL DATA:  Patient trimming 3 days. Fall from ladder. Initial encounter.   EXAM: LEFT HUMERUS - 2+ VIEW  COMPARISON:  None.  FINDINGS: There is a displaced oblique fracture through the mid diaphysis of the left humerus. No definite evidence for associated acute fractures within the humerus.  IMPRESSION: Comminuted displaced mid left humeral diaphysis fracture.   Electronically Signed   By: Lovey Newcomer M.D.   On: 12/05/2014 15:11   Dg Hand Complete Left  12/05/2014   CLINICAL DATA:  Left index finger pain and deformity following a 15 foot fall off a ladder while trimming tree limbs. Initial encounter.  EXAM: LEFT HAND - COMPLETE 3+ VIEW  COMPARISON:  Wrist radiographs obtained at the same time.  FINDINGS: Dorsal dislocation of the second distal phalanx relative to the proximal phalanx at the DIP joint. No visible finger fracture. There is a comminuted, impacted fracture of the distal radial metaphysis and epiphysis with 1.5 shaft width of dorsal displacement of the distal fragment and remainder of the wrist and hand and overlapping of the fragments. The distal ulna appears intact.  IMPRESSION: 1. Dorsal dislocation at the second DIP joint without fracture. 2. Comminuted and markedly displaced distal radius fracture.   Electronically Signed   By: Claudie Revering M.D.   On: 12/05/2014 15:23    Positive ROS: All other systems have been reviewed and were otherwise negative with the exception of those mentioned in the HPI and as above.  Physical Exam: General: Alert, no acute distress Cardiovascular: No pedal edema Respiratory: No cyanosis, no use of accessory musculature GI: No organomegaly, abdomen is soft and non-tender Skin: No lesions in the area of chief complaint Neurologic: Sensation intact distally Psychiatric: Patient is competent for consent with normal mood and affect Lymphatic: No axillary or cervical lymphadenopathy  MUSCULOSKELETAL:  - obvious deformity of left upper arm, skin intact - r/m/u/ax nerves intact - no signs of acute CTS - hand wwp - dorsal  dislocation of left index finger DIP joint, finger wwp - 2+ radial pulse - traumatic wound over volar PIP crease with 1 strand of tendon exposed of left long finger, finger wwp - normal finger cascade  Assessment: 1. Left humeral shaft fx 2. Left distal radius fx 3. Left index finger DIP dislocation 4. Left long finger volar wound with tendon involvement  Plan: - dislocation and fractures reduced under conscious sedation, splinted - dislocation of left longer finger reduced by ER prior to ortho eval - patient tolerated well - may dc home - will contact patient for surgery for this week - patient understands risks of surgery - augmentin 875 bid - pain meds per ER - needs follow up on Monday to reevaluate long finger - long finger irrigated and bacitracin ointment placed in wound and  wrapped with dry dressing  Thank you for the consult and the opportunity to see Mr. Hildebrandt  N. Eduard Roux, MD [place] 4:01 PM

## 2014-12-08 ENCOUNTER — Encounter (HOSPITAL_COMMUNITY): Payer: Self-pay | Admitting: *Deleted

## 2014-12-08 NOTE — Progress Notes (Signed)
Tanner Garza states that he has had a stress test done in the past at [facility], but does not remember why.  Patient denies chest pain.  I am requesting records.

## 2014-12-09 ENCOUNTER — Ambulatory Visit (HOSPITAL_COMMUNITY): Payer: Medicare Other

## 2014-12-09 ENCOUNTER — Ambulatory Visit (HOSPITAL_COMMUNITY): Payer: Medicare Other | Admitting: Anesthesiology

## 2014-12-09 ENCOUNTER — Encounter (HOSPITAL_COMMUNITY)
Admission: RE | Disposition: A | Discharge status: Home / Self Care | Payer: Self-pay | Source: Ambulatory Visit | Attending: Orthopaedic Surgery

## 2014-12-09 ENCOUNTER — Observation Stay (HOSPITAL_COMMUNITY)
Admission: RE | Admit: 2014-12-09 | Discharge: 2014-12-10 | Disposition: A | Discharge status: Home / Self Care | Payer: Medicare Other | Source: Ambulatory Visit | Attending: Orthopaedic Surgery | Admitting: Orthopaedic Surgery

## 2014-12-09 ENCOUNTER — Encounter (HOSPITAL_COMMUNITY): Payer: Self-pay | Admitting: *Deleted

## 2014-12-09 DIAGNOSIS — E785 Hyperlipidemia, unspecified: Secondary | ICD-10-CM | POA: Diagnosis not present

## 2014-12-09 DIAGNOSIS — X58XXXA Exposure to other specified factors, initial encounter: Secondary | ICD-10-CM | POA: Diagnosis not present

## 2014-12-09 DIAGNOSIS — S52572A Other intraarticular fracture of lower end of left radius, initial encounter for closed fracture: Secondary | ICD-10-CM | POA: Insufficient documentation

## 2014-12-09 DIAGNOSIS — S42302A Unspecified fracture of shaft of humerus, left arm, initial encounter for closed fracture: Secondary | ICD-10-CM | POA: Diagnosis not present

## 2014-12-09 DIAGNOSIS — Z419 Encounter for procedure for purposes other than remedying health state, unspecified: Secondary | ICD-10-CM

## 2014-12-09 DIAGNOSIS — Z91041 Radiographic dye allergy status: Secondary | ICD-10-CM | POA: Diagnosis not present

## 2014-12-09 DIAGNOSIS — N400 Enlarged prostate without lower urinary tract symptoms: Secondary | ICD-10-CM | POA: Diagnosis not present

## 2014-12-09 DIAGNOSIS — S52532A Colles' fracture of left radius, initial encounter for closed fracture: Secondary | ICD-10-CM | POA: Diagnosis not present

## 2014-12-09 DIAGNOSIS — Z888 Allergy status to other drugs, medicaments and biological substances status: Secondary | ICD-10-CM | POA: Diagnosis not present

## 2014-12-09 DIAGNOSIS — S52572B Other intraarticular fracture of lower end of left radius, initial encounter for open fracture type I or II: Secondary | ICD-10-CM | POA: Diagnosis not present

## 2014-12-09 DIAGNOSIS — S52502D Unspecified fracture of the lower end of left radius, subsequent encounter for closed fracture with routine healing: Secondary | ICD-10-CM | POA: Diagnosis not present

## 2014-12-09 DIAGNOSIS — K219 Gastro-esophageal reflux disease without esophagitis: Secondary | ICD-10-CM | POA: Diagnosis not present

## 2014-12-09 DIAGNOSIS — E669 Obesity, unspecified: Secondary | ICD-10-CM | POA: Diagnosis not present

## 2014-12-09 DIAGNOSIS — S42322A Displaced transverse fracture of shaft of humerus, left arm, initial encounter for closed fracture: Secondary | ICD-10-CM | POA: Diagnosis not present

## 2014-12-09 DIAGNOSIS — S61213A Laceration without foreign body of left middle finger without damage to nail, initial encounter: Secondary | ICD-10-CM | POA: Diagnosis present

## 2014-12-09 DIAGNOSIS — I10 Essential (primary) hypertension: Secondary | ICD-10-CM | POA: Diagnosis not present

## 2014-12-09 DIAGNOSIS — F17210 Nicotine dependence, cigarettes, uncomplicated: Secondary | ICD-10-CM | POA: Insufficient documentation

## 2014-12-09 DIAGNOSIS — Z86010 Personal history of colonic polyps: Secondary | ICD-10-CM | POA: Insufficient documentation

## 2014-12-09 DIAGNOSIS — G4733 Obstructive sleep apnea (adult) (pediatric): Secondary | ICD-10-CM | POA: Insufficient documentation

## 2014-12-09 DIAGNOSIS — G8918 Other acute postprocedural pain: Secondary | ICD-10-CM | POA: Diagnosis not present

## 2014-12-09 DIAGNOSIS — Z6837 Body mass index (BMI) 37.0-37.9, adult: Secondary | ICD-10-CM | POA: Insufficient documentation

## 2014-12-09 DIAGNOSIS — S42302D Unspecified fracture of shaft of humerus, left arm, subsequent encounter for fracture with routine healing: Secondary | ICD-10-CM | POA: Diagnosis not present

## 2014-12-09 DIAGNOSIS — S52502A Unspecified fracture of the lower end of left radius, initial encounter for closed fracture: Secondary | ICD-10-CM

## 2014-12-09 DIAGNOSIS — F419 Anxiety disorder, unspecified: Secondary | ICD-10-CM | POA: Diagnosis not present

## 2014-12-09 DIAGNOSIS — S42302B Unspecified fracture of shaft of humerus, left arm, initial encounter for open fracture: Secondary | ICD-10-CM | POA: Diagnosis not present

## 2014-12-09 DIAGNOSIS — S63283A Dislocation of proximal interphalangeal joint of left middle finger, initial encounter: Secondary | ICD-10-CM | POA: Insufficient documentation

## 2014-12-09 DIAGNOSIS — Z9049 Acquired absence of other specified parts of digestive tract: Secondary | ICD-10-CM | POA: Insufficient documentation

## 2014-12-09 DIAGNOSIS — S42309A Unspecified fracture of shaft of humerus, unspecified arm, initial encounter for closed fracture: Secondary | ICD-10-CM

## 2014-12-09 DIAGNOSIS — Z7982 Long term (current) use of aspirin: Secondary | ICD-10-CM | POA: Diagnosis not present

## 2014-12-09 HISTORY — DX: Unspecified fracture of shaft of humerus, left arm, initial encounter for closed fracture: S42.302A

## 2014-12-09 HISTORY — DX: Adverse effect of unspecified anesthetic, initial encounter: T41.45XA

## 2014-12-09 HISTORY — DX: Laceration without foreign body of left middle finger without damage to nail, initial encounter: S61.213A

## 2014-12-09 HISTORY — PX: I & D EXTREMITY: SHX5045

## 2014-12-09 HISTORY — DX: Unspecified fracture of shaft of humerus, unspecified arm, initial encounter for closed fracture: S42.309A

## 2014-12-09 HISTORY — DX: Other complications of anesthesia, initial encounter: T88.59XA

## 2014-12-09 HISTORY — PX: ORIF HUMERUS FRACTURE: SHX2126

## 2014-12-09 HISTORY — DX: Unspecified fracture of the lower end of left radius, initial encounter for closed fracture: S52.502A

## 2014-12-09 HISTORY — PX: OPEN REDUCTION INTERNAL FIXATION (ORIF) DISTAL RADIAL FRACTURE: SHX5989

## 2014-12-09 LAB — GLUCOSE, CAPILLARY: GLUCOSE-CAPILLARY: 142 mg/dL — AB (ref 65–99)

## 2014-12-09 SURGERY — OPEN REDUCTION INTERNAL FIXATION (ORIF) DISTAL HUMERUS FRACTURE
Anesthesia: General | Laterality: Left

## 2014-12-09 SURGERY — OPEN REDUCTION INTERNAL FIXATION (ORIF) HUMERAL SHAFT FRACTURE
Anesthesia: Regional | Site: Finger | Laterality: Left

## 2014-12-09 MED ORDER — FENTANYL CITRATE (PF) 100 MCG/2ML IJ SOLN
INTRAMUSCULAR | Status: DC | PRN
  Administered 2014-12-09: 100 ug via INTRAVENOUS
  Administered 2014-12-09: 50 ug via INTRAVENOUS

## 2014-12-09 MED ORDER — METHOCARBAMOL 500 MG PO TABS: 500.0000 mg | ORAL_TABLET | Freq: Four times a day (QID) | ORAL | Status: DC | PRN

## 2014-12-09 MED ORDER — ACETAMINOPHEN 325 MG PO TABS: 650.0000 mg | ORAL_TABLET | Freq: Four times a day (QID) | ORAL | Status: DC | PRN

## 2014-12-09 MED ORDER — EPHEDRINE SULFATE 50 MG/ML IJ SOLN
INTRAMUSCULAR | Status: AC
  Filled 2014-12-09: qty 1

## 2014-12-09 MED ORDER — MIDAZOLAM HCL 2 MG/2ML IJ SOLN
INTRAMUSCULAR | Status: AC
Start: 2014-12-09 — End: 2014-12-09
  Administered 2014-12-09: 2 mg
  Filled 2014-12-09: qty 2

## 2014-12-09 MED ORDER — BUPIVACAINE HCL (PF) 0.25 % IJ SOLN
INTRAMUSCULAR | Status: AC
Start: 2014-12-09 — End: 2014-12-09
  Filled 2014-12-09: qty 30

## 2014-12-09 MED ORDER — CEFAZOLIN SODIUM-DEXTROSE 2-3 GM-% IV SOLR
2.0000 g | Freq: Four times a day (QID) | INTRAVENOUS | Status: AC
  Administered 2014-12-09 – 2014-12-10 (×2): 2 g via INTRAVENOUS
  Filled 2014-12-09 (×3): qty 50

## 2014-12-09 MED ORDER — ACETAMINOPHEN 650 MG RE SUPP: 650.0000 mg | Freq: Four times a day (QID) | RECTAL | Status: DC | PRN

## 2014-12-09 MED ORDER — ONDANSETRON HCL 4 MG/2ML IJ SOLN: 4.0000 mg | Freq: Four times a day (QID) | INTRAMUSCULAR | Status: DC | PRN

## 2014-12-09 MED ORDER — ONDANSETRON HCL 4 MG PO TABS: 4.0000 mg | ORAL_TABLET | Freq: Four times a day (QID) | ORAL | Status: DC | PRN

## 2014-12-09 MED ORDER — LOSARTAN POTASSIUM 50 MG PO TABS
100.0000 mg | ORAL_TABLET | Freq: Every day | ORAL | Status: DC
  Administered 2014-12-09: 100 mg via ORAL
  Filled 2014-12-09 (×2): qty 2

## 2014-12-09 MED ORDER — OXYCODONE HCL 5 MG PO TABS: 5.0000 mg | ORAL_TABLET | Freq: Once | ORAL | Status: DC | PRN

## 2014-12-09 MED ORDER — PANTOPRAZOLE SODIUM 40 MG PO TBEC
40.0000 mg | DELAYED_RELEASE_TABLET | ORAL | Status: DC
  Filled 2014-12-09: qty 1

## 2014-12-09 MED ORDER — NEOSTIGMINE METHYLSULFATE 10 MG/10ML IV SOLN
INTRAVENOUS | Status: DC | PRN
Start: 2014-12-09 — End: 2014-12-09
  Administered 2014-12-09: 4 mg via INTRAVENOUS

## 2014-12-09 MED ORDER — DEXAMETHASONE SODIUM PHOSPHATE 4 MG/ML IJ SOLN
INTRAMUSCULAR | Status: AC
  Filled 2014-12-09: qty 1

## 2014-12-09 MED ORDER — DIPHENHYDRAMINE HCL 12.5 MG/5ML PO ELIX: 25.0000 mg | ORAL_SOLUTION | ORAL | Status: DC | PRN

## 2014-12-09 MED ORDER — ALPRAZOLAM 0.5 MG PO TABS
0.5000 mg | ORAL_TABLET | Freq: Every morning | ORAL | Status: DC
  Administered 2014-12-10: 0.5 mg via ORAL
  Filled 2014-12-09: qty 1

## 2014-12-09 MED ORDER — FENTANYL CITRATE (PF) 100 MCG/2ML IJ SOLN
INTRAMUSCULAR | Status: AC
Start: 2014-12-09 — End: 2014-12-09
  Administered 2014-12-09: 100 ug
  Filled 2014-12-09: qty 2

## 2014-12-09 MED ORDER — GABAPENTIN 300 MG PO CAPS
300.0000 mg | ORAL_CAPSULE | Freq: Every day | ORAL | Status: DC
  Administered 2014-12-09: 300 mg via ORAL
  Filled 2014-12-09: qty 1

## 2014-12-09 MED ORDER — MIDAZOLAM HCL 2 MG/2ML IJ SOLN
INTRAMUSCULAR | Status: AC
  Filled 2014-12-09: qty 2

## 2014-12-09 MED ORDER — MIDAZOLAM HCL 5 MG/5ML IJ SOLN
INTRAMUSCULAR | Status: DC | PRN
  Administered 2014-12-09: 2 mg via INTRAVENOUS

## 2014-12-09 MED ORDER — SODIUM CHLORIDE 0.9 % IV SOLN: 10000.0000 ug | INTRAVENOUS | Status: DC | PRN

## 2014-12-09 MED ORDER — GLYCOPYRROLATE 0.2 MG/ML IJ SOLN
INTRAMUSCULAR | Status: DC | PRN
  Administered 2014-12-09: 0.6 mg via INTRAVENOUS

## 2014-12-09 MED ORDER — FENTANYL CITRATE (PF) 250 MCG/5ML IJ SOLN
INTRAMUSCULAR | Status: AC
Start: 2014-12-09 — End: 2014-12-09
  Filled 2014-12-09: qty 5

## 2014-12-09 MED ORDER — LUTEIN 6 MG PO TABS: 6.0000 mg | ORAL_TABLET | Freq: Every day | ORAL | Status: DC

## 2014-12-09 MED ORDER — NEOSTIGMINE METHYLSULFATE 10 MG/10ML IV SOLN
INTRAVENOUS | Status: AC
  Filled 2014-12-09: qty 1

## 2014-12-09 MED ORDER — ONDANSETRON HCL 4 MG/2ML IJ SOLN
INTRAMUSCULAR | Status: DC | PRN
  Administered 2014-12-09: 4 mg via INTRAVENOUS

## 2014-12-09 MED ORDER — LACTATED RINGERS IV SOLN
INTRAVENOUS | Status: DC
  Administered 2014-12-09: 08:00:00 via INTRAVENOUS

## 2014-12-09 MED ORDER — SUCCINYLCHOLINE CHLORIDE 20 MG/ML IJ SOLN
INTRAMUSCULAR | Status: DC | PRN
  Administered 2014-12-09: 100 mg via INTRAVENOUS

## 2014-12-09 MED ORDER — BACITRACIN-POLYMYXIN B 500-10000 UNIT/GM OP OINT
TOPICAL_OINTMENT | OPHTHALMIC | Status: AC
Start: 2014-12-09 — End: 2014-12-09
  Filled 2014-12-09: qty 3.5

## 2014-12-09 MED ORDER — LIDOCAINE HCL (CARDIAC) 20 MG/ML IV SOLN
INTRAVENOUS | Status: DC | PRN
  Administered 2014-12-09: 60 mg via INTRAVENOUS

## 2014-12-09 MED ORDER — LACTATED RINGERS IV SOLN
INTRAVENOUS | Status: DC | PRN
  Administered 2014-12-09 (×2): via INTRAVENOUS

## 2014-12-09 MED ORDER — DEXAMETHASONE SODIUM PHOSPHATE 4 MG/ML IJ SOLN
INTRAMUSCULAR | Status: DC | PRN
  Administered 2014-12-09: 4 mg via INTRAVENOUS

## 2014-12-09 MED ORDER — OXYCODONE HCL 5 MG PO TABS: 5.0000 mg | ORAL_TABLET | ORAL | Status: DC | PRN

## 2014-12-09 MED ORDER — METOCLOPRAMIDE HCL 5 MG/ML IJ SOLN: 5.0000 mg | Freq: Three times a day (TID) | INTRAMUSCULAR | Status: DC | PRN

## 2014-12-09 MED ORDER — METHOCARBAMOL 1000 MG/10ML IJ SOLN
500.0000 mg | Freq: Four times a day (QID) | INTRAVENOUS | Status: DC | PRN
  Filled 2014-12-09: qty 5

## 2014-12-09 MED ORDER — EPHEDRINE SULFATE 50 MG/ML IJ SOLN
INTRAMUSCULAR | Status: DC | PRN
  Administered 2014-12-09 (×2): 5 mg via INTRAVENOUS

## 2014-12-09 MED ORDER — OXYCODONE HCL 5 MG/5ML PO SOLN: 5.0000 mg | Freq: Once | ORAL | Status: DC | PRN

## 2014-12-09 MED ORDER — SODIUM CHLORIDE 0.9 % IV SOLN
INTRAVENOUS | Status: DC
  Administered 2014-12-09: 125 mL/h via INTRAVENOUS
  Administered 2014-12-10: 04:00:00 via INTRAVENOUS

## 2014-12-09 MED ORDER — ONDANSETRON HCL 4 MG/2ML IJ SOLN
INTRAMUSCULAR | Status: AC
  Filled 2014-12-09: qty 2

## 2014-12-09 MED ORDER — KETOROLAC TROMETHAMINE 15 MG/ML IJ SOLN
7.5000 mg | Freq: Four times a day (QID) | INTRAMUSCULAR | Status: DC
  Administered 2014-12-09 – 2014-12-10 (×3): 7.5 mg via INTRAVENOUS
  Filled 2014-12-09 (×3): qty 1

## 2014-12-09 MED ORDER — DOUBLE ANTIBIOTIC 500-10000 UNIT/GM EX OINT
TOPICAL_OINTMENT | CUTANEOUS | Status: AC
  Filled 2014-12-09: qty 1

## 2014-12-09 MED ORDER — PROPOFOL 10 MG/ML IV BOLUS
INTRAVENOUS | Status: AC
Start: 2014-12-09 — End: 2014-12-09
  Filled 2014-12-09: qty 20

## 2014-12-09 MED ORDER — DEXTROSE 5 % IV SOLN
3.0000 g | INTRAVENOUS | Status: AC
  Administered 2014-12-09: 3 g via INTRAVENOUS
  Filled 2014-12-09: qty 3000

## 2014-12-09 MED ORDER — GLYCOPYRROLATE 0.2 MG/ML IJ SOLN
INTRAMUSCULAR | Status: AC
  Filled 2014-12-09: qty 3

## 2014-12-09 MED ORDER — LACTATED RINGERS IV SOLN
INTRAVENOUS | Status: DC | PRN
  Administered 2014-12-09 (×2): via INTRAVENOUS

## 2014-12-09 MED ORDER — METOCLOPRAMIDE HCL 5 MG PO TABS: 5.0000 mg | ORAL_TABLET | Freq: Three times a day (TID) | ORAL | Status: DC | PRN

## 2014-12-09 MED ORDER — ASPIRIN 325 MG PO TABS
325.0000 mg | ORAL_TABLET | Freq: Every day | ORAL | Status: DC
  Administered 2014-12-09 – 2014-12-10 (×2): 325 mg via ORAL
  Filled 2014-12-09: qty 1

## 2014-12-09 MED ORDER — PROPOFOL 10 MG/ML IV BOLUS
INTRAVENOUS | Status: DC | PRN
  Administered 2014-12-09: 100 mg via INTRAVENOUS
  Administered 2014-12-09: 200 mg via INTRAVENOUS

## 2014-12-09 MED ORDER — PHENYLEPHRINE HCL 10 MG/ML IJ SOLN
INTRAMUSCULAR | Status: DC | PRN
  Administered 2014-12-09 (×3): 40 ug via INTRAVENOUS

## 2014-12-09 MED ORDER — PHENYLEPHRINE HCL 10 MG/ML IJ SOLN
INTRAMUSCULAR | Status: AC
  Filled 2014-12-09: qty 1

## 2014-12-09 MED ORDER — FINASTERIDE 5 MG PO TABS
5.0000 mg | ORAL_TABLET | Freq: Every day | ORAL | Status: DC
  Administered 2014-12-10: 5 mg via ORAL
  Filled 2014-12-09: qty 1

## 2014-12-09 MED ORDER — 0.9 % SODIUM CHLORIDE (POUR BTL) OPTIME
TOPICAL | Status: DC | PRN
  Administered 2014-12-09: 1000 mL

## 2014-12-09 MED ORDER — BUPIVACAINE-EPINEPHRINE (PF) 0.5% -1:200000 IJ SOLN
INTRAMUSCULAR | Status: DC | PRN
Start: 2014-12-09 — End: 2014-12-09
  Administered 2014-12-09: 30 mL via PERINEURAL

## 2014-12-09 MED ORDER — PROPOFOL 10 MG/ML IV BOLUS
INTRAVENOUS | Status: AC
  Filled 2014-12-09: qty 20

## 2014-12-09 MED ORDER — AMLODIPINE BESYLATE 5 MG PO TABS
5.0000 mg | ORAL_TABLET | Freq: Every day | ORAL | Status: DC
Start: 2014-12-09 — End: 2014-12-10
  Administered 2014-12-10: 5 mg via ORAL
  Filled 2014-12-09: qty 1

## 2014-12-09 MED ORDER — ROCURONIUM BROMIDE 100 MG/10ML IV SOLN
INTRAVENOUS | Status: DC | PRN
Start: 2014-12-09 — End: 2014-12-09
  Administered 2014-12-09: 20 mg via INTRAVENOUS

## 2014-12-09 MED ORDER — PHENYLEPHRINE HCL 10 MG/ML IJ SOLN
10.0000 mg | INTRAMUSCULAR | Status: DC | PRN
  Administered 2014-12-09: 10 ug/min via INTRAVENOUS

## 2014-12-09 MED ORDER — SENNOSIDES-DOCUSATE SODIUM 8.6-50 MG PO TABS: 1.0000 | ORAL_TABLET | Freq: Every evening | ORAL | Status: DC | PRN

## 2014-12-09 MED ORDER — MORPHINE SULFATE 2 MG/ML IJ SOLN: 2.0000 mg | INTRAMUSCULAR | Status: DC | PRN

## 2014-12-09 MED ORDER — HYDROMORPHONE HCL 1 MG/ML IJ SOLN: 0.2500 mg | INTRAMUSCULAR | Status: DC | PRN

## 2014-12-09 SURGICAL SUPPLY — 111 items
BANDAGE ELASTIC 3 VELCRO ST LF (GAUZE/BANDAGES/DRESSINGS) ×4 IMPLANT
BANDAGE ELASTIC 4 VELCRO ST LF (GAUZE/BANDAGES/DRESSINGS) ×4 IMPLANT
BIT DRILL Q/COUPLING 1 (BIT) ×1 IMPLANT
BLADE SURG 10 STRL SS (BLADE) ×4 IMPLANT
BLADE SURG 15 STRL LF DISP TIS (BLADE) ×3 IMPLANT
BLADE SURG 15 STRL SS (BLADE) ×4
BLADE SURG ROTATE 9660 (MISCELLANEOUS) IMPLANT
BNDG CMPR 9X4 STRL LF SNTH (GAUZE/BANDAGES/DRESSINGS) ×3
BNDG COHESIVE 1X5 TAN STRL LF (GAUZE/BANDAGES/DRESSINGS) IMPLANT
BNDG COHESIVE 4X5 TAN STRL (GAUZE/BANDAGES/DRESSINGS) ×4 IMPLANT
BNDG COHESIVE 6X5 TAN STRL LF (GAUZE/BANDAGES/DRESSINGS) ×8 IMPLANT
BNDG CONFORM 2 STRL LF (GAUZE/BANDAGES/DRESSINGS) ×1 IMPLANT
BNDG CONFORM 3 STRL LF (GAUZE/BANDAGES/DRESSINGS) IMPLANT
BNDG ESMARK 4X9 LF (GAUZE/BANDAGES/DRESSINGS) ×4 IMPLANT
BNDG GAUZE ELAST 4 BULKY (GAUZE/BANDAGES/DRESSINGS) ×4 IMPLANT
BNDG GAUZE STRTCH 6 (GAUZE/BANDAGES/DRESSINGS) ×12 IMPLANT
CORDS BIPOLAR (ELECTRODE) ×4 IMPLANT
COVER SURGICAL LIGHT HANDLE (MISCELLANEOUS) ×4 IMPLANT
CUFF TOURNIQUET SINGLE 18IN (TOURNIQUET CUFF) ×4 IMPLANT
CUFF TOURNIQUET SINGLE 24IN (TOURNIQUET CUFF) IMPLANT
CUFF TOURNIQUET SINGLE 34IN LL (TOURNIQUET CUFF) ×8 IMPLANT
CUFF TOURNIQUET SINGLE 44IN (TOURNIQUET CUFF) IMPLANT
DRAPE C-ARM 42X72 X-RAY (DRAPES) ×4 IMPLANT
DRAPE EXTREMITY BILATERAL (DRAPE) IMPLANT
DRAPE IMP U-DRAPE 54X76 (DRAPES) ×4 IMPLANT
DRAPE INCISE IOBAN 66X45 STRL (DRAPES) ×16 IMPLANT
DRAPE SURG 17X23 STRL (DRAPES) IMPLANT
DRAPE U-SHAPE 47X51 STRL (DRAPES) ×4 IMPLANT
DRSG TEGADERM 4X4.75 (GAUZE/BANDAGES/DRESSINGS) ×16 IMPLANT
DURAPREP 26ML APPLICATOR (WOUND CARE) ×4 IMPLANT
ELECT CAUTERY BLADE 6.4 (BLADE) ×4 IMPLANT
ELECT REM PT RETURN 9FT ADLT (ELECTROSURGICAL) ×4
ELECTRODE REM PT RTRN 9FT ADLT (ELECTROSURGICAL) ×3 IMPLANT
FACESHIELD WRAPAROUND (MASK) ×4 IMPLANT
FACESHIELD WRAPAROUND OR TEAM (MASK) ×3 IMPLANT
GAUZE SPONGE 4X4 12PLY STRL (GAUZE/BANDAGES/DRESSINGS) ×8 IMPLANT
GAUZE XEROFORM 1X8 LF (GAUZE/BANDAGES/DRESSINGS) ×4 IMPLANT
GAUZE XEROFORM 5X9 LF (GAUZE/BANDAGES/DRESSINGS) ×4 IMPLANT
GLOVE NEODERM STRL 7.5 LF PF (GLOVE) ×6 IMPLANT
GLOVE SURG NEODERM 7.5  LF PF (GLOVE) ×2
GOWN STRL REIN XL XLG (GOWN DISPOSABLE) ×12 IMPLANT
HANDPIECE INTERPULSE COAX TIP (DISPOSABLE)
KIT BASIN OR (CUSTOM PROCEDURE TRAY) ×4 IMPLANT
KIT ROOM TURNOVER OR (KITS) ×4 IMPLANT
LOOP VESSEL MAXI BLUE (MISCELLANEOUS) ×1 IMPLANT
LOOP VESSEL MINI RED (MISCELLANEOUS) ×1 IMPLANT
MANIFOLD NEPTUNE II (INSTRUMENTS) ×4 IMPLANT
NEEDLE 22X1 1/2 (OR ONLY) (NEEDLE) IMPLANT
NS IRRIG 1000ML POUR BTL (IV SOLUTION) ×8 IMPLANT
PACK ORTHO EXTREMITY (CUSTOM PROCEDURE TRAY) ×4 IMPLANT
PACK SHOULDER (CUSTOM PROCEDURE TRAY) ×4 IMPLANT
PACK UNIVERSAL I (CUSTOM PROCEDURE TRAY) ×4 IMPLANT
PAD ABD 8X10 STRL (GAUZE/BANDAGES/DRESSINGS) ×4 IMPLANT
PAD ARMBOARD 7.5X6 YLW CONV (MISCELLANEOUS) ×8 IMPLANT
PAD CAST 4YDX4 CTTN HI CHSV (CAST SUPPLIES) ×3 IMPLANT
PADDING CAST ABS 3INX4YD NS (CAST SUPPLIES) ×1
PADDING CAST ABS 4INX4YD NS (CAST SUPPLIES) ×1
PADDING CAST ABS COTTON 3X4 (CAST SUPPLIES) IMPLANT
PADDING CAST ABS COTTON 4X4 ST (CAST SUPPLIES) ×6 IMPLANT
PADDING CAST COTTON 4X4 STRL (CAST SUPPLIES) ×4
PADDING CAST COTTON 6X4 STRL (CAST SUPPLIES) ×4 IMPLANT
PEG LOCKING SMOOTH 2.2X26 (Peg) ×1 IMPLANT
PLATE LOCKING 7 HOLE BROAD (Plate) ×1 IMPLANT
PLATE WIDE DVR LEFT (Plate) ×1 IMPLANT
SCREW CORTEX ST 4.5X30 (Screw) ×4 IMPLANT
SCREW CORTEX ST 4.5X32 (Screw) ×2 IMPLANT
SCREW LOCK 18X2.7X 3 LD TPR (Screw) IMPLANT
SCREW LOCK 22X2.7X 3 LD TPR (Screw) IMPLANT
SCREW LOCK 24X2.7X3 LD THRD (Screw) IMPLANT
SCREW LOCK 26X2.7X 3 LD TPR (Screw) IMPLANT
SCREW LOCKING 2.7X18 (Screw) ×12 IMPLANT
SCREW LOCKING 2.7X22MM (Screw) ×4 IMPLANT
SCREW LOCKING 2.7X24MM (Screw) ×4 IMPLANT
SCREW LOCKING 2.7X26MM (Screw) ×4 IMPLANT
SCREW LOCKING 2.7X30MM (Screw) ×1 IMPLANT
SCREW LP NL 2.7X22MM (Screw) ×1 IMPLANT
SCREW NLOCK 24X2.7 3 LD (Screw) IMPLANT
SCREW NONLOCK 2.7X24 (Screw) ×4 IMPLANT
SET HNDPC FAN SPRY TIP SCT (DISPOSABLE) IMPLANT
SLING ARM IMMOBILIZER LRG (SOFTGOODS) ×4 IMPLANT
SPONGE GAUZE 4X4 12PLY STER LF (GAUZE/BANDAGES/DRESSINGS) ×1 IMPLANT
SPONGE LAP 18X18 X RAY DECT (DISPOSABLE) ×8 IMPLANT
SPONGE LAP 4X18 X RAY DECT (DISPOSABLE) IMPLANT
STAPLER VISISTAT 35W (STAPLE) IMPLANT
STOCKINETTE IMPERVIOUS 9X36 MD (GAUZE/BANDAGES/DRESSINGS) ×4 IMPLANT
SUCTION FRAZIER TIP 10 FR DISP (SUCTIONS) IMPLANT
SUT ETHILON 2 0 FS 18 (SUTURE) ×12 IMPLANT
SUT ETHILON 2 0 PSLX (SUTURE) ×4 IMPLANT
SUT ETHILON 3 0 PS 1 (SUTURE) ×9 IMPLANT
SUT ETHILON 4 0 PS 2 18 (SUTURE) ×5 IMPLANT
SUT FIBERWIRE 2-0 18 17.9 3/8 (SUTURE) ×4
SUT MNCRL AB 4-0 PS2 18 (SUTURE) IMPLANT
SUT PROLENE 3 0 PS 2 (SUTURE) IMPLANT
SUT VIC AB 0 CT1 27 (SUTURE) ×8
SUT VIC AB 0 CT1 27XBRD ANBCTR (SUTURE) ×6 IMPLANT
SUT VIC AB 2-0 CT1 27 (SUTURE) ×8
SUT VIC AB 2-0 CT1 36 (SUTURE) ×5 IMPLANT
SUT VIC AB 2-0 CT1 TAPERPNT 27 (SUTURE) ×6 IMPLANT
SUT VIC AB 2-0 FS1 27 (SUTURE) ×8 IMPLANT
SUT VIC AB 3-0 FS2 27 (SUTURE) IMPLANT
SUTURE FIBERWR 2-0 18 17.9 3/8 (SUTURE) IMPLANT
SYR CONTROL 10ML LL (SYRINGE) IMPLANT
TOWEL OR 17X24 6PK STRL BLUE (TOWEL DISPOSABLE) ×4 IMPLANT
TOWEL OR 17X26 10 PK STRL BLUE (TOWEL DISPOSABLE) ×4 IMPLANT
TUBE ANAEROBIC SPECIMEN COL (MISCELLANEOUS) IMPLANT
TUBE CONNECTING 12X1/4 (SUCTIONS) ×4 IMPLANT
TUBE FEEDING 5FR 15 INCH (TUBING) IMPLANT
TUBING CYSTO DISP (UROLOGICAL SUPPLIES) ×4 IMPLANT
UNDERPAD 30X30 INCONTINENT (UNDERPADS AND DIAPERS) ×8 IMPLANT
WATER STERILE IRR 1000ML POUR (IV SOLUTION) ×4 IMPLANT
YANKAUER SUCT BULB TIP NO VENT (SUCTIONS) ×4 IMPLANT

## 2014-12-09 NOTE — Anesthesia Procedure Notes (Addendum)
Procedure Name: Intubation Date/Time: 12/09/2014 10:34 AM Performed by: Vennie Homans Pre-anesthesia Checklist: Patient identified, Timeout performed, Emergency Drugs available, Suction available and Patient being monitored Patient Re-evaluated:Patient Re-evaluated prior to inductionOxygen Delivery Method: Circle system utilized Preoxygenation: Pre-oxygenation with 100% oxygen Intubation Type: IV induction and Cricoid Pressure applied Ventilation: Mask ventilation without difficulty Laryngoscope Size: 4 and Mac Grade View: Grade II Tube type: Oral Tube size: 7.5 mm Number of attempts: 1 Airway Equipment and Method: Stylet Placement Confirmation: ETT inserted through vocal cords under direct vision,  positive ETCO2 and breath sounds checked- equal and bilateral Secured at: 23 (23) cm Tube secured with: Tape Dental Injury: Teeth and Oropharynx as per pre-operative assessment     Anesthesia Regional Block:  Supraclavicular block  Pre-Anesthetic Checklist: ,, timeout performed, Correct Patient, Correct Site, Correct Laterality, Correct Procedure, Correct Position, site marked, Risks and benefits discussed,  Surgical consent,  Pre-op evaluation,  At surgeon's request and post-op pain management  Laterality: Left  Prep: chloraprep       Needles:  Injection technique: Single-shot  Needle Type: Echogenic Stimulator Needle     Needle Length: 5cm 5 cm Needle Gauge: 22 and 22 G    Additional Needles:  Procedures: ultrasound guided (picture in chart) and nerve stimulator Supraclavicular block  Nerve Stimulator or Paresthesia:  Response: biceps flexion, 0.45 mA,   Additional Responses:   Narrative:  Start time: 12/09/2014 9:30 AM End time: 12/09/2014 9:41 AM Injection made incrementally with aspirations every 5 mL.  Performed by: Personally  Anesthesiologist: HODIERNE, ADAM  Additional Notes: Functioning IV was confirmed and monitors were applied.  A 5mm 22ga Arrow  echogenic stimulator needle was used. Sterile prep and drape,hand hygiene and sterile gloves were used.  Negative aspiration and negative test dose prior to incremental administration of local anesthetic. The patient tolerated the procedure well.  Ultrasound guidance: relevent anatomy identified, needle position confirmed, local anesthetic spread visualized around nerve(s), vascular puncture avoided.  Image printed for medical record.    Anesthesia Regional Block:   Narrative:

## 2014-12-09 NOTE — Anesthesia Preprocedure Evaluation (Signed)
Anesthesia Evaluation  Patient identified by MRN, date of birth, ID band Patient awake    Reviewed: Allergy & Precautions, NPO status , Patient's Chart, lab work & pertinent test results  Airway Mallampati: II   Neck ROM: full    Dental   Pulmonary sleep apnea , Current Smoker,  breath sounds clear to auscultation        Cardiovascular hypertension, + dysrhythmias Supra Ventricular Tachycardia Rhythm:regular Rate:Normal     Neuro/Psych    GI/Hepatic GERD-  ,  Endo/Other  obese  Renal/GU      Musculoskeletal   Abdominal   Peds  Hematology   Anesthesia Other Findings   Reproductive/Obstetrics                             Anesthesia Physical Anesthesia Plan  ASA: II  Anesthesia Plan: General and Regional   Post-op Pain Management: MAC Combined w/ Regional for Post-op pain   Induction: Intravenous  Airway Management Planned: Oral ETT  Additional Equipment:   Intra-op Plan:   Post-operative Plan: Extubation in OR  Informed Consent: I have reviewed the patients History and Physical, chart, labs and discussed the procedure including the risks, benefits and alternatives for the proposed anesthesia with the patient or authorized representative who has indicated his/her understanding and acceptance.     Plan Discussed with: CRNA, Anesthesiologist and Surgeon  Anesthesia Plan Comments:         Anesthesia Quick Evaluation

## 2014-12-09 NOTE — Progress Notes (Signed)
CPAP machine and tubing was setup for patient. Patient brought home nasal pillows with him. Water chamber was filled and Auto mode settings were 4 cmH2O min and 20 max. Pt stated that he could place self on and off CPAP. If he needed any assistance he would call.

## 2014-12-09 NOTE — Op Note (Signed)
Date of surgery: 12/09/2014  Preoperative diagnosis: 1. Left humeral shaft fracture 2. Left distal radius fracture intra-articular and comminuted 3. Left long finger open dislocation of PIP joint  Postoperative diagnosis: Same  Procedure: 1. Open reduction internal fixation of left humeral shaft fracture 2. Open reduction internal fixation of left distal radius fracture greater than 3 fragments 3.  Irrigation debridement and exploration of left long finger PIP dislocation associated with an open dislocation  Implants: 1. Synthes 4.5 mm 7 hole dynamic compression plate  2. Biomet DVR distal radius plate  Surgeon: Eduard Roux, M.D.  Anesthesia: Gen. and regional  Estimated blood loss: 998 mL  Complications: None  Condition to PACU: Stable  Indications for procedure: The patient is a 69 year old gentleman who sustained significant injuries to his left upper extremity. He was brought back for surgical treatment of his above-mentioned conditions today. He is aware the risks benefits alternatives to surgery and wish to proceed.  Description of procedure: Patient was identified in the preoperative holding area. The operative sites were marked by the surgeon confirmed with the patient. He is brought back to the operating room. General anesthesia was induced. He was placed supine on the table. Preoperative marks were given. Timeout was performed. Left upper extremity was prepped and draped in standard sterile fashion.  We first began with the humeral shaft fracture. A standard anterolateral approach to the humerus was used. The biceps was mobilized medially and the brachialis muscle was exposed. The brachialis was then split longitudinally and carefully with a Cobb elevator. The radial nerve was identified at its typical location. A vessel loop was used to identify and keep her protected during the case. We then exposed the fracture both proximally and distally. Organized hematoma and  entrapped periosteum was removed from the fracture site. We then obtained a reduction by using bone clamps and this was held in place by putting a 7 hole dynamic compression plate across the lateral aspect of the humerus. Fluoroscopy was used to confirm adequate reduction. We then placed screws sequentially on either side of the fracture and we placed them he centrically to gain compression across the fracture site. Altogether we placed 3 nonlocking screws proximal and 3 nonlocking screws distally from the fracture. They all had great purchase. Final x-rays were taken. The wound was thoroughly irrigated and closed in layer fashion using 0 Vicryl 2.0 Vicryls and 3-0 nylon. We then turned our attention to the distal radius fracture. A sterile tourniquet was placed on the upper forearm. The extremity was exsanguinate using Esmarch bandage and the tourniquet was inflated to 250 mmHg. We use a standard FCR approach to the volar wrist. We elevated the pronator quadratus off of the volar aspect of the distal radius. We then exposed the fracture. The fracture was quite comminuted with a separate radial styloid piece. We then placed a Biomet DVR distal radius plate on the volar aspect and placed it at the appropriate position and this was confirmed under fluoroscopy. We first put in a shaft screw to hold the plate in place. We then placed a series of nonlocking and locking screws across the distal and proximal row for the distal portion of the fracture dorsally. We then obtained a reduction of the radial styloid and this was held in place with 2 locking screws.  He also had a volar ulnar piece that was separate from the other fractures. Given how distal was I was not able to capture it this piece with any screws I  used a 2-0 FiberWire to suture this down to the plate. Final x-rays were taken which showed reduction of the fracture and adequate placement of the plates and hardware. The wound was thoroughly irrigated and closed  in layer fashion using 2.0 Vicryl and 3-0 nylon.  We then turned our attention to exploration of the left long finger. A standard Bruner incision was incorporated into the horizontal traumatic wound across the PIP crease. Full-thickness flaps were created. The neurovascular bundles were identified on either side of the finger. The flexor tendons did not appear to be violated there was a volar plate injury but the PIP joint was stable. Sharp excisional debridement of the subcutaneous tissue and skin was performed. This was performed with a rongeur. We then thoroughly irrigated the wound out and closed it loosely with interrupted 4-0 nylon. Sterile dressings were applied on the finger. A volar slab splint was placed for the wrist. Sterile dressings were applied for the humerus. Patient was x-rayed and transferred to the PACU in stable condition. All sponge counts were correct.  Postoperative plan: The patient will be admitted overnight for pain control. We will have him be evaluated by occupational therapy in the morning and we will discharge him in the morning.  Azucena Cecil, MD [place] 1:42 PM

## 2014-12-09 NOTE — Transfer of Care (Signed)
Immediate Anesthesia Transfer of Care Note  Patient: Tanner Garza  Procedure(s) Performed: Procedure(s): OPEN REDUCTION INTERNAL FIXATION (ORIF) HUMERAL SHAFT FRACTURE (Left) OPEN REDUCTION INTERNAL FIXATION (ORIF) DISTAL RADIAL FRACTURE (Left) IRRIGATION AND DEBRIDEMENT LEFT LONG FINGER (Left)  Patient Location: PACU  Anesthesia Type:GA combined with regional for post-op pain  Level of Consciousness: awake, alert , oriented, patient cooperative and responds to stimulation  Airway & Oxygen Therapy: Patient Spontanous Breathing and Patient connected to face mask oxygen  Post-op Assessment: Report given to RN, Post -op Vital signs reviewed and stable, Patient moving all extremities X 4 and Patient able to stick tongue midline  Post vital signs: stable  Last Vitals:  Filed Vitals:   12/09/14 1005  BP: 123/82  Pulse:   Temp:   Resp:     Complications: No apparent anesthesia complications

## 2014-12-09 NOTE — H&P (Signed)
PREOPERATIVE H&P  Chief Complaint:  left humerus, radius fracture  HPI: Tanner Garza is a 69 y.o. male who presents for surgical treatment of  left humerus, radius fracture.  He denies any changes in medical history.  Past Medical History  Diagnosis Date  . Hypertension   . BPH (benign prostatic hyperplasia)   . Anxiety   . Esophageal reflux   . Bell's palsy 09/2001  . Hyperlipidemia   . Eczema   . Colon polyp   . OSA (obstructive sleep apnea)   . SVT (supraventricular tachycardia)   . Complication of anesthesia     Slow to awaken   Past Surgical History  Procedure Laterality Date  . Cholecystectomy    . Appendectomy    . Replantation thumb Right   . Colonoscopy w/ polypectomy     History   Social History  . Marital Status: Married    Spouse Name: N/A  . Number of Children: N/A  . Years of Education: N/A   Social History Main Topics  . Smoking status: Current Every Day Smoker -- 0.50 packs/day for 45 years  . Smokeless tobacco: Not on file  . Alcohol Use: No  . Drug Use: No  . Sexual Activity: Not on file   Other Topics Concern  . None   Social History Narrative   History reviewed. No pertinent family history. Allergies  Allergen Reactions  . Atenolol     bradycardia  . Chantix [Varenicline]     Sedation   . Dye Fdc Red [Red Dye]     X ray dye rash  . Hctz [Hydrochlorothiazide]     Bradycardia    Prior to Admission medications   Medication Sig Start Date End Date Taking? Authorizing Provider  ALPRAZolam Duanne Moron) 0.5 MG tablet Take 0.5 mg by mouth every morning.    Yes Historical Provider, MD  amLODipine (NORVASC) 5 MG tablet Take 5 mg by mouth daily.   Yes Historical Provider, MD  amoxicillin-clavulanate (AUGMENTIN) 875-125 MG per tablet Take 1 tablet by mouth every 12 (twelve) hours. 12/05/14  Yes Jola Schmidt, MD  aspirin 325 MG tablet Take 325 mg by mouth daily.   Yes Historical Provider, MD  finasteride (PROSCAR) 5 MG tablet Take 5 mg by  mouth daily.   Yes Historical Provider, MD  gabapentin (NEURONTIN) 300 MG capsule Take 300 mg by mouth at bedtime.    Yes Historical Provider, MD  losartan (COZAAR) 100 MG tablet Take 100 mg by mouth daily.   Yes Historical Provider, MD  Lutein 6 MG TABS Take 6 mg by mouth daily.    Yes Historical Provider, MD  Multiple Vitamin (MULTIVITAMIN) tablet Take 1 tablet by mouth daily.   Yes Historical Provider, MD  oxyCODONE-acetaminophen (PERCOCET/ROXICET) 5-325 MG per tablet Take 1 tablet by mouth every 4 (four) hours as needed for severe pain. Patient taking differently: Take 1 tablet by mouth at bedtime.  12/05/14  Yes Jola Schmidt, MD  OXYGEN-HELIUM IN Inhale into the lungs. CPAP   Yes Historical Provider, MD  pantoprazole (PROTONIX) 40 MG tablet Take 40 mg by mouth every other day.    Yes Historical Provider, MD     Positive ROS: All other systems have been reviewed and were otherwise negative with the exception of those mentioned in the HPI and as above.  Physical Exam: General: Alert, no acute distress Cardiovascular: No pedal edema Respiratory: No cyanosis, no use of accessory musculature GI: abdomen soft Skin: No lesions in the area of  chief complaint Neurologic: Sensation intact distally Psychiatric: Patient is competent for consent with normal mood and affect Lymphatic: no lymphedema  MUSCULOSKELETAL: exam stable  Assessment:  left humerus, radius fracture  Plan: Plan for Procedure(s): OPEN REDUCTION INTERNAL FIXATION (ORIF) HUMERAL SHAFT FRACTURE OPEN REDUCTION INTERNAL FIXATION (ORIF) DISTAL RADIAL FRACTURE IRRIGATION AND DEBRIDEMENT LEFT LONG FINGER, REPAIR AS NECESSARY  The risks benefits and alternatives were discussed with the patient including but not limited to the risks of nonoperative treatment, versus surgical intervention including infection, bleeding, nerve injury,  blood clots, cardiopulmonary complications, morbidity, mortality, among others, and they were  willing to proceed.   Marianna Payment, MD   12/09/2014 6:55 AM

## 2014-12-09 NOTE — Progress Notes (Signed)
Orthopedic Tech Progress Note Patient Details:  Tanner Garza [facility] [birth year] [id number] Pt. unable to use OHF with trapeze due to humerus fracture. Patient ID: Tanner Garza, male   DOB: 12-12-1945, 69 y.o.   MRN: [id number]   [name] 12/09/2014, 4:54 PM

## 2014-12-10 ENCOUNTER — Encounter (HOSPITAL_COMMUNITY): Payer: Self-pay | Admitting: Orthopaedic Surgery

## 2014-12-10 DIAGNOSIS — F419 Anxiety disorder, unspecified: Secondary | ICD-10-CM | POA: Diagnosis not present

## 2014-12-10 DIAGNOSIS — S42302A Unspecified fracture of shaft of humerus, left arm, initial encounter for closed fracture: Secondary | ICD-10-CM | POA: Diagnosis not present

## 2014-12-10 DIAGNOSIS — F17210 Nicotine dependence, cigarettes, uncomplicated: Secondary | ICD-10-CM | POA: Diagnosis not present

## 2014-12-10 DIAGNOSIS — S52572A Other intraarticular fracture of lower end of left radius, initial encounter for closed fracture: Secondary | ICD-10-CM | POA: Diagnosis not present

## 2014-12-10 DIAGNOSIS — S63283A Dislocation of proximal interphalangeal joint of left middle finger, initial encounter: Secondary | ICD-10-CM | POA: Diagnosis not present

## 2014-12-10 DIAGNOSIS — E785 Hyperlipidemia, unspecified: Secondary | ICD-10-CM | POA: Diagnosis not present

## 2014-12-10 MED ORDER — SENNOSIDES-DOCUSATE SODIUM 8.6-50 MG PO TABS: 1.0000 | ORAL_TABLET | Freq: Every evening | ORAL | Status: DC | PRN

## 2014-12-10 MED ORDER — OXYCODONE HCL 5 MG PO TABS: 5.0000 mg | ORAL_TABLET | ORAL | Status: DC | PRN

## 2014-12-10 MED ORDER — CETYLPYRIDINIUM CHLORIDE 0.05 % MT LIQD
7.0000 mL | Freq: Two times a day (BID) | OROMUCOSAL | Status: DC
  Administered 2014-12-10: 7 mL via OROMUCOSAL

## 2014-12-10 NOTE — Evaluation (Addendum)
Occupational Therapy Evaluation and Discharge Patient Details Name: Tanner Garza MRN: [id number] DOB: [birth year] Today's Date: 12/10/2014    History of Present Illness Pt fell of ladder that broke now s/p ORIF left humeral shaft fracture; ORIF left distal radius fracture greater than 3 fragments; I&D and exploration left long finger PIP dislocation associated with an open dislocation.   Clinical Impression   This 69 yo male admitted and underwent above presents to acute OT with all education completed, we will D/C from acute OT. Pt ambulating around part of the unit without issues and only has one step at home, no PT needs identified and made them aware.    Follow Up Recommendations  No OT follow up    Equipment Recommendations  None recommended by OT       Precautions / Restrictions Precautions Precautions: Fall Restrictions Weight Bearing Restrictions: Yes LUE Weight Bearing: Partial weight bearing LUE Partial Weight Bearing Percentage or Pounds: 50%      Mobility Bed Mobility               General bed mobility comments: Pt up in [facility] upon my entry  Transfers Overall transfer level: Modified independent               General transfer comment: Pt ambulated around 1/3 of unit without issues         ADL Overall ADL's : Modified independent                                             Vision Additional Comments: No changed from baseline          Pertinent Vitals/Pain Pain Assessment:  (toerable)     Hand Dominance Right   Extremity/Trunk Assessment Upper Extremity Assessment Upper Extremity Assessment: LUE deficits/detail LUE Deficits / Details: casting from proximal to MCPs up to 2/3 of humerus as well as middle finger bandaged LUE Coordination: decreased fine motor;decreased gross motor           Communication Communication Communication: No difficulties   Cognition Arousal/Alertness: Awake/alert Behavior During  Therapy: WFL for tasks assessed/performed Overall Cognitive Status: Within Functional Limits for tasks assessed                        Exercises   Other Exercises Other Exercises: Encourged pt to move his left fingers and shoulder throughout the day to keep them from getting stiff and edematous. Also advised him to keep his arm elevated when he is sitting and laying down to help prevent edema        Home Living Family/patient expects to be discharged to:: Private residence Living Arrangements: Children Available Help at Discharge: Family;Available PRN/intermittently Type of Home: House Home Access: Stairs to enter Entrance Stairs-Number of Steps: 1 Entrance Stairs-Rails: None Home Layout: Laundry or work area in basement (does not have to go down steps)     Bathroom Shower/Tub: Occupational psychologist: Standard (sink in front off to side)     Home Equipment: None          Prior Functioning/Environment Level of Independence: Independent             OT Diagnosis: Generalized weakness;Acute pain   OT Problem List: Decreased range of motion;Pain;Impaired UE functional use      OT Goals(Current goals can be found  in the care plan section) Acute Rehab OT Goals Patient Stated Goal: home today  OT Frequency:                End of Session Equipment Utilized During Treatment:  (sling) Nurse Communication:  (Pt is ready to go from OT standpoint and PT does not need to see before he D/Cs)  Activity Tolerance: Patient tolerated treatment well Patient left: in chair   Time: [phone] OT Time Calculation (min): 36 min Charges:  OT General Charges $OT Visit: 1 Procedure OT Evaluation $Initial OT Evaluation Tier I: 1 Procedure OT Treatments $Self Care/Home Management : 8-22 mins G-Codes: OT G-codes **NOT FOR INPATIENT CLASS** Functional Assessment Tool Used: Clinical observation Functional Limitation: Self care Self Care Current Status (T7017):  At least 1 percent but less than 20 percent impaired, limited or restricted Self Care Goal Status (B9390): At least 1 percent but less than 20 percent impaired, limited or restricted Self Care Discharge Status [phone]): At least 1 percent but less than 20 percent impaired, limited or restricted  Almon Register [phone] 12/10/2014, 10:46 AM

## 2014-12-10 NOTE — Discharge Instructions (Signed)
Postoperative instructions:  Weightbearing: Platform weight bearing  Dressing instructions: Keep your dressing and/or splint clean and dry at all times.  It will be removed at your first post-operative appointment.  Your stitches and/or staples will be removed at this visit.  Please change the finger dressings daily with neosporin and dry dressing.  Incision instructions:  Do not soak your incision for 3 weeks after surgery.  If the incision gets wet, pat dry and do not scrub the incision.  Pain control:  You have been given a prescription to be taken as directed for post-operative pain control.  In addition, elevate the operative extremity above the heart at all times to prevent swelling and throbbing pain.  Take over-the-counter Colace, 100mg  by mouth twice a day while taking narcotic pain medications to help prevent constipation.  Follow up appointments: 1) 10-14 days for suture removal and wound check. 2) Dr. Erlinda Hong as scheduled.   -------------------------------------------------------------------------------------------------------------  After Surgery Pain Control:  After your surgery, post-surgical discomfort or pain is likely. This discomfort can last several days to a few weeks. At certain times of the day your discomfort may be more intense.  Did you receive a nerve block?  A nerve block can provide pain relief for one hour to two days after your surgery. As long as the nerve block is working, you will experience little or no sensation in the area the surgeon operated on.  As the nerve block wears off, you will begin to experience pain or discomfort. It is very important that you begin taking your prescribed pain medication before the nerve block fully wears off. Treating your pain at the first sign of the block wearing off will ensure your pain is better controlled and more tolerable when full-sensation returns. Do not wait until the pain is intolerable, as the medicine will be less  effective. It is better to treat pain in advance than to try and catch up.  General Anesthesia:  If you did not receive a nerve block during your surgery, you will need to start taking your pain medication shortly after your surgery and should continue to do so as prescribed by your surgeon.  Pain Medication:  Most commonly we prescribe Vicodin and Percocet for post-operative pain. Both of these medications contain a combination of acetaminophen (Tylenol) and a narcotic to help control pain.   It takes between 30 and 45 minutes before pain medication starts to work. It is important to take your medication before your pain level gets too intense.   Nausea is a common side effect of many pain medications. You will want to eat something before taking your pain medicine to help prevent nausea.   If you are taking a prescription pain medication that contains acetaminophen, we recommend that you do not take additional over the counter acetaminophen (Tylenol).  Other pain relieving options:   Using a cold pack to ice the affected area a few times a day (15 to 20 minutes at a time) can help to relieve pain, reduce swelling and bruising.   Elevation of the affected area can also help to reduce pain and swelling.

## 2014-12-10 NOTE — Progress Notes (Signed)
Discharge complete waiting on patient ride

## 2014-12-10 NOTE — Progress Notes (Signed)
PT Cancellation Note  Patient Details Name: Tanner Garza MRN: [id number] DOB: May 02, 1946   Cancelled Treatment:    Reason Eval/Treat Not Completed: PT screened, no needs identified, will sign off Spoke with OT stating pt is ambulating in hallway without issues and was independent PTA. Per OT, pt has no PT or mobility needs. Awaiting ride to be discharged. Will sign off for now.    Marguarite Arbour A Gray Doering 12/10/2014, 11:14 AM  Wray Kearns, [place], DPT [phone]

## 2014-12-10 NOTE — Progress Notes (Signed)
   Subjective:  Patient reports pain as mild.    Objective:   VITALS:   Filed Vitals:   12/09/14 1502 12/09/14 1830 12/09/14 1931 12/10/14 0617  BP: 83/64 118/70 149/89 150/90  Pulse: 78  98 84  Temp: 97.6 F (36.4 C)  99.2 F (37.3 C) 98.3 F (36.8 C)  TempSrc: Axillary  Oral Oral  Resp: 16     Height:      Weight:      SpO2: 95%  96% 93%    Radial nerve intact Block is still partially working Hand wwp Dressings c/d/i   Lab Results  Component Value Date   WBC 6.7 12/05/2014   HGB 14.6 12/05/2014   HCT 41.1 12/05/2014   MCV 94.5 12/05/2014   PLT 187 12/05/2014     Assessment/Plan:  1 Day Post-Op   - Expected postop acute blood loss anemia - will monitor for symptoms - Up with PT/OT - DVT ppx - SCDs, ambulation - PFWB left upper extremity - Pain control - home today after OT  Marianna Payment 12/10/2014, 7:43 AM [phone]

## 2014-12-10 NOTE — Anesthesia Postprocedure Evaluation (Signed)
Anesthesia Post Note  Patient: Tanner Garza  Procedure(s) Performed: Procedure(s) (LRB): OPEN REDUCTION INTERNAL FIXATION (ORIF) HUMERAL SHAFT FRACTURE (Left) OPEN REDUCTION INTERNAL FIXATION (ORIF) DISTAL RADIAL FRACTURE (Left) IRRIGATION AND DEBRIDEMENT LEFT LONG FINGER (Left)  Anesthesia type: General  Patient location: PACU  Post pain: Pain level controlled and Adequate analgesia  Post assessment: Post-op Vital signs reviewed, Patient's Cardiovascular Status Stable, Respiratory Function Stable, Patent Airway and Pain level controlled  Last Vitals:  Filed Vitals:   12/10/14 0617  BP: 150/90  Pulse: 84  Temp: 36.8 C  Resp:     Post vital signs: Reviewed and stable  Level of consciousness: awake, alert  and oriented  Complications: No apparent anesthesia complications

## 2014-12-14 ENCOUNTER — Emergency Department (HOSPITAL_COMMUNITY)
Admission: EM | Admit: 2014-12-14 | Discharge: 2014-12-14 | Disposition: A | Discharge status: Home / Self Care | Payer: Medicare Other | Attending: Emergency Medicine | Admitting: Emergency Medicine

## 2014-12-14 ENCOUNTER — Encounter (HOSPITAL_COMMUNITY): Payer: Self-pay | Admitting: *Deleted

## 2014-12-14 ENCOUNTER — Emergency Department (HOSPITAL_COMMUNITY): Payer: Medicare Other

## 2014-12-14 ENCOUNTER — Emergency Department (HOSPITAL_COMMUNITY)
Admit: 2014-12-14 | Discharge: 2014-12-14 | Disposition: A | Discharge status: Home / Self Care | Payer: Medicare Other | Attending: Emergency Medicine | Admitting: Emergency Medicine

## 2014-12-14 DIAGNOSIS — S2232XA Fracture of one rib, left side, initial encounter for closed fracture: Secondary | ICD-10-CM

## 2014-12-14 DIAGNOSIS — Z872 Personal history of diseases of the skin and subcutaneous tissue: Secondary | ICD-10-CM | POA: Diagnosis not present

## 2014-12-14 DIAGNOSIS — Y9389 Activity, other specified: Secondary | ICD-10-CM | POA: Insufficient documentation

## 2014-12-14 DIAGNOSIS — S2242XA Multiple fractures of ribs, left side, initial encounter for closed fracture: Secondary | ICD-10-CM | POA: Insufficient documentation

## 2014-12-14 DIAGNOSIS — E785 Hyperlipidemia, unspecified: Secondary | ICD-10-CM | POA: Insufficient documentation

## 2014-12-14 DIAGNOSIS — I10 Essential (primary) hypertension: Secondary | ICD-10-CM | POA: Insufficient documentation

## 2014-12-14 DIAGNOSIS — K219 Gastro-esophageal reflux disease without esophagitis: Secondary | ICD-10-CM | POA: Diagnosis not present

## 2014-12-14 DIAGNOSIS — Z720 Tobacco use: Secondary | ICD-10-CM | POA: Insufficient documentation

## 2014-12-14 DIAGNOSIS — F419 Anxiety disorder, unspecified: Secondary | ICD-10-CM | POA: Insufficient documentation

## 2014-12-14 DIAGNOSIS — Y998 Other external cause status: Secondary | ICD-10-CM | POA: Diagnosis not present

## 2014-12-14 DIAGNOSIS — W11XXXA Fall on and from ladder, initial encounter: Secondary | ICD-10-CM | POA: Diagnosis not present

## 2014-12-14 DIAGNOSIS — Z79899 Other long term (current) drug therapy: Secondary | ICD-10-CM | POA: Insufficient documentation

## 2014-12-14 DIAGNOSIS — I639 Cerebral infarction, unspecified: Secondary | ICD-10-CM | POA: Diagnosis not present

## 2014-12-14 DIAGNOSIS — Z86010 Personal history of colonic polyps: Secondary | ICD-10-CM | POA: Diagnosis not present

## 2014-12-14 DIAGNOSIS — Z87448 Personal history of other diseases of urinary system: Secondary | ICD-10-CM | POA: Diagnosis not present

## 2014-12-14 DIAGNOSIS — Z9889 Other specified postprocedural states: Secondary | ICD-10-CM | POA: Insufficient documentation

## 2014-12-14 DIAGNOSIS — Z7982 Long term (current) use of aspirin: Secondary | ICD-10-CM | POA: Insufficient documentation

## 2014-12-14 DIAGNOSIS — Z8669 Personal history of other diseases of the nervous system and sense organs: Secondary | ICD-10-CM | POA: Insufficient documentation

## 2014-12-14 DIAGNOSIS — S299XXA Unspecified injury of thorax, initial encounter: Secondary | ICD-10-CM | POA: Diagnosis present

## 2014-12-14 DIAGNOSIS — I471 Supraventricular tachycardia: Secondary | ICD-10-CM | POA: Diagnosis not present

## 2014-12-14 DIAGNOSIS — Y9289 Other specified places as the place of occurrence of the external cause: Secondary | ICD-10-CM | POA: Diagnosis not present

## 2014-12-14 MED ORDER — METHOCARBAMOL 500 MG PO TABS: 500.0000 mg | ORAL_TABLET | Freq: Four times a day (QID) | ORAL | Status: DC | PRN

## 2014-12-14 NOTE — Discharge Summary (Signed)
Physician Discharge Summary      Patient ID: Tanner Garza MRN: [id number] DOB/AGE: [birth year] 69 y.o.  Admit date: 12/09/2014 Discharge date: 12/14/2014  Admission Diagnoses:  <principal problem not specified>  Discharge Diagnoses:  Active Problems:   Closed fracture of left humerus   Fracture of left distal radius   Laceration of middle finger of left hand without complication   Humerus fracture   Past Medical History  Diagnosis Date  . Hypertension   . BPH (benign prostatic hyperplasia)   . Anxiety   . Esophageal reflux   . Bell's palsy 09/2001  . Hyperlipidemia   . Eczema   . Colon polyp   . OSA (obstructive sleep apnea)   . SVT (supraventricular tachycardia)   . Complication of anesthesia     Slow to awaken    Surgeries: Procedure(s): OPEN REDUCTION INTERNAL FIXATION (ORIF) HUMERAL SHAFT FRACTURE OPEN REDUCTION INTERNAL FIXATION (ORIF) DISTAL RADIAL FRACTURE IRRIGATION AND DEBRIDEMENT LEFT LONG FINGER on 12/09/2014   Consultants (if any):    Discharged Condition: Improved  Hospital Course: Tanner Garza is an 69 y.o. male who was admitted 12/09/2014 with a diagnosis of <principal problem not specified> and went to the operating room on 12/09/2014 and underwent the above named procedures.    He was given perioperative antibiotics:  Anti-infectives    Start     Dose/Rate Route Frequency Ordered Stop   12/09/14 1600  ceFAZolin (ANCEF) IVPB 2 g/50 mL premix     2 g 100 mL/hr over 30 Minutes Intravenous Every 6 hours 12/09/14 1511 12/10/14 0959   12/09/14 1100  ceFAZolin (ANCEF) 3 g in dextrose 5 % 50 mL IVPB     3 g 160 mL/hr over 30 Minutes Intravenous To Surgery 12/09/14 1055 12/09/14 1050    .  He was given sequential compression devices, early ambulation for DVT prophylaxis.  He benefited maximally from the hospital stay and there were no complications.    Recent vital signs:  Filed Vitals:   12/10/14 0617  BP: 150/90  Pulse: 84  Temp: 98.3  F (36.8 C)  Resp:     Recent laboratory studies:  Lab Results  Component Value Date   HGB 14.6 12/05/2014   HGB 14.1 03/12/2007   Lab Results  Component Value Date   WBC 6.7 12/05/2014   PLT 187 12/05/2014   No results found for: INR Lab Results  Component Value Date   NA 139 12/05/2014   K 3.6 12/05/2014   CL 105 12/05/2014   CO2 24 12/05/2014   BUN 17 12/05/2014   CREATININE 1.23 12/05/2014   GLUCOSE 146* 12/05/2014    Discharge Medications:     Medication List    STOP taking these medications        amoxicillin-clavulanate 875-125 MG per tablet  Commonly known as:  AUGMENTIN      TAKE these medications        ALPRAZolam 0.5 MG tablet  Commonly known as:  XANAX  Take 0.5 mg by mouth every morning.     amLODipine 5 MG tablet  Commonly known as:  NORVASC  Take 5 mg by mouth daily.     aspirin 325 MG tablet  Take 325 mg by mouth daily.     finasteride 5 MG tablet  Commonly known as:  PROSCAR  Take 5 mg by mouth daily.     gabapentin 300 MG capsule  Commonly known as:  NEURONTIN  Take 300 mg by mouth at bedtime.  losartan 100 MG tablet  Commonly known as:  COZAAR  Take 100 mg by mouth daily.     Lutein 6 MG Tabs  Take 6 mg by mouth daily.     multivitamin tablet  Take 1 tablet by mouth daily.     oxyCODONE 5 MG immediate release tablet  Commonly known as:  Oxy IR/ROXICODONE  Take 1-3 tablets (5-15 mg total) by mouth every 4 (four) hours as needed.     oxyCODONE-acetaminophen 5-325 MG per tablet  Commonly known as:  PERCOCET/ROXICET  Take 1 tablet by mouth every 4 (four) hours as needed for severe pain.     OXYGEN  Inhale into the lungs. CPAP     pantoprazole 40 MG tablet  Commonly known as:  PROTONIX  Take 40 mg by mouth every other day.     senna-docusate 8.6-50 MG per tablet  Commonly known as:  SENOKOT S  Take 1 tablet by mouth at bedtime as needed.        Diagnostic Studies: Dg Chest 1 View  12/05/2014   CLINICAL DATA:   Fall 15 feet from ladder  EXAM: CHEST  1 VIEW  COMPARISON:  None.  FINDINGS: Lungs are clear.  No pleural effusion or pneumothorax.  The heart is top-normal in size.  IMPRESSION: No evidence of acute cardiopulmonary disease.   Electronically Signed   By: Julian Hy M.D.   On: 12/05/2014 15:21   Dg Ribs Unilateral W/chest Left  12/14/2014   CLINICAL DATA:  Recent fall with left chest pain, initial encounter  EXAM: LEFT RIBS AND CHEST - 3+ VIEW  COMPARISON:  12/05/2014  FINDINGS: Cardiac shadow is within normal limits. Left rib cage is well visualized. There is a mildly displaced fracture of the left eighth rib posteriorly and anteriorly. Similar changes are noted in the ninth rib posteriorly. No pneumothorax is seen. The lungs are clear bilaterally.  IMPRESSION: Fractures of the left eighth and ninth ribs not well appreciated on prior exams. No associated pneumothorax is noted.   Electronically Signed   By: Inez Catalina M.D.   On: 12/14/2014 08:22   Dg Forearm Left  12/05/2014   CLINICAL DATA:  Fall 15 feet from ladder. Deformity and left upper arm and wrist.  EXAM: LEFT FOREARM - 2 VIEW  COMPARISON:  None.  FINDINGS: Comminuted common displaced fracture noted through the distal left radius. Fracture fragments and carpal bones are displaced posteriorly 1 shaft width. No additional forearm abnormality.  IMPRESSION: Highly comminuted, posteriorly displaced distal left radial fracture.   Electronically Signed   By: Rolm Baptise M.D.   On: 12/05/2014 15:19   Dg Wrist 2 Views Left  12/09/2014   CLINICAL DATA:  Open reduction internal fixation left wrist.  EXAM: DG C-ARM 61-120 MIN; LEFT WRIST - 2 VIEW  TECHNIQUE: Spot images obtained.  CONTRAST:  None.  FLUOROSCOPY TIME:  Fluoroscopy Time (in minutes and seconds): 0 min 56 seconds.  Number of Acquired Images:  2  COMPARISON:  12/05/2014.  FINDINGS: Open reduction internal fixation of the distal left radial fracture with near anatomic alignment noted.  Hardware intact.  IMPRESSION: Open reduction internal fixation distal left radial fracture.   Electronically Signed   By: Marcello Moores  Register   On: 12/09/2014 14:08   Dg Wrist Complete Left  12/05/2014   CLINICAL DATA:  Fall from ladder onto left side. Deformity of wrist.  EXAM: LEFT WRIST - COMPLETE 3+ VIEW  COMPARISON:  None.  FINDINGS: There is a  comminuted distal left radial fracture. Fracture fragments and carpal bones displaced posteriorly approximately 1 shaft width. No visible ulnar abnormality.  IMPRESSION: Severely comminuted, posteriorly displaced distal left radial fracture.   Electronically Signed   By: Rolm Baptise M.D.   On: 12/05/2014 15:17   Dg Humerus Left  12/09/2014   CLINICAL DATA:  ORIF left humeral fracture  EXAM: LEFT HUMERUS - 2+ VIEW  COMPARISON:  Left humerus films of 12/05/2014  FINDINGS: C-arm spot films show plate and screw fixation of the mid left humeral fracture in good position and alignment on the images obtained.  IMPRESSION: ORIF of mid left humeral fracture.   Electronically Signed   By: Ivar Drape M.D.   On: 12/09/2014 14:00   Dg Humerus Left  12/05/2014   CLINICAL DATA:  Patient trimming 3 days. Fall from ladder. Initial encounter.  EXAM: LEFT HUMERUS - 2+ VIEW  COMPARISON:  None.  FINDINGS: There is a displaced oblique fracture through the mid diaphysis of the left humerus. No definite evidence for associated acute fractures within the humerus.  IMPRESSION: Comminuted displaced mid left humeral diaphysis fracture.   Electronically Signed   By: Lovey Newcomer M.D.   On: 12/05/2014 15:11   Dg Hand Complete Left  12/05/2014   CLINICAL DATA:  Left index finger pain and deformity following a 15 foot fall off a ladder while trimming tree limbs. Initial encounter.  EXAM: LEFT HAND - COMPLETE 3+ VIEW  COMPARISON:  Wrist radiographs obtained at the same time.  FINDINGS: Dorsal dislocation of the second distal phalanx relative to the proximal phalanx at the DIP joint. No visible  finger fracture. There is a comminuted, impacted fracture of the distal radial metaphysis and epiphysis with 1.5 shaft width of dorsal displacement of the distal fragment and remainder of the wrist and hand and overlapping of the fragments. The distal ulna appears intact.  IMPRESSION: 1. Dorsal dislocation at the second DIP joint without fracture. 2. Comminuted and markedly displaced distal radius fracture.   Electronically Signed   By: Claudie Revering M.D.   On: 12/05/2014 15:23   Dg C-arm 61-120 Min  12/09/2014   CLINICAL DATA:  Open reduction internal fixation left wrist.  EXAM: DG C-ARM 61-120 MIN; LEFT WRIST - 2 VIEW  TECHNIQUE: Spot images obtained.  CONTRAST:  None.  FLUOROSCOPY TIME:  Fluoroscopy Time (in minutes and seconds): 0 min 56 seconds.  Number of Acquired Images:  2  COMPARISON:  12/05/2014.  FINDINGS: Open reduction internal fixation of the distal left radial fracture with near anatomic alignment noted. Hardware intact.  IMPRESSION: Open reduction internal fixation distal left radial fracture.   Electronically Signed   By: Marcello Moores  Register   On: 12/09/2014 14:08    Disposition: 01-Home or Self Care      Discharge Instructions    Call MD / Call 911    Complete by:  As directed   If you experience chest pain or shortness of breath, CALL 911 and be transported to the hospital emergency room.  If you develope a fever above 101.5 F, pus (white drainage) or increased drainage or redness at the wound, or calf pain, call your surgeon's office.     Constipation Prevention    Complete by:  As directed   Drink plenty of fluids.  Prune juice may be helpful.  You may use a stool softener, such as Colace (over the counter) 100 mg twice a day.  Use MiraLax (over the counter) for constipation as  needed.     Diet - low sodium heart healthy    Complete by:  As directed      Diet general    Complete by:  As directed      Driving restrictions    Complete by:  As directed   No driving while taking  narcotic pain meds.     Increase activity slowly as tolerated    Complete by:  As directed            Follow-up Information    Follow up with Marianna Payment, MD In 2 weeks.   Specialty:  Orthopedic Surgery   Why:  For suture removal, For wound re-check   Contact information:   [address] [place] [address] [phone]        Signed: Marianna Payment 12/14/2014, 2:50 PM

## 2014-12-14 NOTE — Discharge Instructions (Signed)
Read the information below.  Use the prescribed medication as directed.  Please discuss all new medications with your pharmacist.  You may return to the Emergency Department at any time for worsening condition or any new symptoms that concern you.  If you develop worsening chest pain, shortness of breath, fever, you pass out, or become weak or dizzy, return to the ER for a recheck.      Rib Fracture A rib fracture is a break or crack in one of the bones of the ribs. The ribs are a group of long, curved bones that wrap around your chest and attach to your spine. They protect your lungs and other organs in the chest cavity. A broken or cracked rib is often painful, but most do not cause other problems. Most rib fractures heal on their own over time. However, rib fractures can be more serious if multiple ribs are broken or if broken ribs move out of place and push against other structures. CAUSES   A direct blow to the chest. For example, this could happen during contact sports, a car accident, or a fall against a hard object.  Repetitive movements with high force, such as pitching a baseball or having severe coughing spells. SYMPTOMS   Pain when you breathe in or cough.  Pain when someone presses on the injured area. DIAGNOSIS  Your caregiver will perform a physical exam. Various imaging tests may be ordered to confirm the diagnosis and to look for related injuries. These tests may include a chest X-ray, computed tomography (CT), magnetic resonance imaging (MRI), or a bone scan. TREATMENT  Rib fractures usually heal on their own in 1-3 months. The longer healing period is often associated with a continued cough or other aggravating activities. During the healing period, pain control is very important. Medication is usually given to control pain. Hospitalization or surgery may be needed for more severe injuries, such as those in which multiple ribs are broken or the ribs have moved out of place.    HOME CARE INSTRUCTIONS   Avoid strenuous activity and any activities or movements that cause pain. Be careful during activities and avoid bumping the injured rib.  Gradually increase activity as directed by your caregiver.  Only take over-the-counter or prescription medications as directed by your caregiver. Do not take other medications without asking your caregiver first.  Apply ice to the injured area for the first 1-2 days after you have been treated or as directed by your caregiver. Applying ice helps to reduce inflammation and pain.  Put ice in a plastic bag.  Place a towel between your skin and the bag.   Leave the ice on for 15-20 minutes at a time, every 2 hours while you are awake.  Perform deep breathing as directed by your caregiver. This will help prevent pneumonia, which is a common complication of a broken rib. Your caregiver may instruct you to:  Take deep breaths several times a day.  Try to cough several times a day, holding a pillow against the injured area.  Use a device called an incentive spirometer to practice deep breathing several times a day.  Drink enough fluids to keep your urine clear or pale yellow. This will help you avoid constipation.   Do not wear a rib belt or binder. These restrict breathing, which can lead to pneumonia.  SEEK IMMEDIATE MEDICAL CARE IF:   You have a fever.   You have difficulty breathing or shortness of breath.   You  develop a continual cough, or you cough up thick or bloody sputum.  You feel sick to your stomach (nausea), throw up (vomit), or have abdominal pain.   You have worsening pain not controlled with medications.  MAKE SURE YOU:  Understand these instructions.  Will watch your condition.  Will get help right away if you are not doing well or get worse. Document Released: 07/03/2005 Document Revised: 03/05/2013 Document Reviewed: 09/04/2012 [facility] Patient Information 2015 [place], [place]. This  information is not intended to replace advice given to you by your health care provider. Make sure you discuss any questions you have with your health care provider.

## 2014-12-14 NOTE — Progress Notes (Signed)
*  PRELIMINARY RESULTS* Vascular Ultrasound Lower extremity venous duplex has been completed.  Preliminary findings: Left = negative for DVT  Landry Mellow, RDMS, RVT  12/14/2014, 9:25 AM

## 2014-12-14 NOTE — ED Notes (Signed)
Provider at the bedside.  

## 2014-12-14 NOTE — ED Provider Notes (Signed)
Tanner Garza is a 69 y.o. male    Face-to-face evaluation   History: He is here for evaluation of left chest wall pain, and transient swelling in his left leg. His wife is worried that he has a blood clot during his leg or his chest. He had a fall about a week ago injuring his left arm requiring surgical repair of the fracture. He has continued pain in his left chest wall that has improved since he put kinetic tape on it.  Physical exam:   Medical screening examination/treatment/procedure(s) were conducted as a shared visit with non-physician practitioner(s) and myself.  I personally evaluated the patient during the encounter    Daleen Bo, MD 12/14/14 1622

## 2014-12-14 NOTE — ED Notes (Signed)
Pt in c/o left flank pain, left rib cage pain, pt recently fell and injured his left side, started having spasms to this area a week ago, pain seems increase over the last few days, pt had surgery on his wrist and the rib pain is worse than his wrist

## 2014-12-14 NOTE — ED Provider Notes (Signed)
CSN: [id number]     Arrival date & time 12/14/14  0735 History   First MD Initiated Contact with Patient 12/14/14 [phone]     Chief Complaint  Patient presents with  . Flank Pain     (Consider location/radiation/quality/duration/timing/severity/associated sxs/prior Treatment) The history is provided by the patient and the spouse.     Patient seen in ED 9 days ago for fall from ladder with fractures and dislocations of left upper extremity presents today with left sided rib pain, muscle spasms, and altered sensation/swelling of left leg.  States he fell 10 feet off a ladder onto concrete onto his left side.  Pt notes his left arm is doing well postoperatively.  He has needed only rare pain medication.  Has noted increased pain in the left ribs over the past few days, stabbing/sharp, worse with movement and cough.  He also notes that his left leg has been tingling and was cold when he woke up this morning.  Denies any pain in his legs, back, neck, head.  Denies fevers, chills, SOB, hemoptysis.  Denies abdominal pain.  Denies weakness or numbness of the arms.  Wife has a hx PE and she is concerned about his left sided chest pain, patient is concerned he may have cracked a rib.  Does note he had transient swelling in his left leg yesterday that has resolved.   Surgery with Dr Erlinda Hong was 5 days ago.    Past Medical History  Diagnosis Date  . Hypertension   . BPH (benign prostatic hyperplasia)   . Anxiety   . Esophageal reflux   . Bell's palsy 09/2001  . Hyperlipidemia   . Eczema   . Colon polyp   . OSA (obstructive sleep apnea)   . SVT (supraventricular tachycardia)   . Complication of anesthesia     Slow to awaken   Past Surgical History  Procedure Laterality Date  . Cholecystectomy    . Appendectomy    . Replantation thumb Right   . Colonoscopy w/ polypectomy    . Orif humerus fracture Left 12/09/2014    Procedure: OPEN REDUCTION INTERNAL FIXATION (ORIF) HUMERAL SHAFT FRACTURE;  Surgeon:  Leandrew Koyanagi, MD;  Location: [facility];  Service: Orthopedics;  Laterality: Left;  . Open reduction internal fixation (orif) distal radial fracture Left 12/09/2014    Procedure: OPEN REDUCTION INTERNAL FIXATION (ORIF) DISTAL RADIAL FRACTURE;  Surgeon: Leandrew Koyanagi, MD;  Location: [facility];  Service: Orthopedics;  Laterality: Left;  . I&d extremity Left 12/09/2014    Procedure: IRRIGATION AND DEBRIDEMENT LEFT LONG FINGER;  Surgeon: Leandrew Koyanagi, MD;  Location: [facility];  Service: Orthopedics;  Laterality: Left;   History reviewed. No pertinent family history. History  Substance Use Topics  . Smoking status: Current Every Day Smoker -- 0.50 packs/day for 45 years  . Smokeless tobacco: Not on file  . Alcohol Use: No    Review of Systems  All other systems reviewed and are negative.     Allergies  Atenolol; Chantix; Dye fdc red; and Hctz  Home Medications   Prior to Admission medications   Medication Sig Start Date End Date Taking? Authorizing Provider  ALPRAZolam Duanne Moron) 0.5 MG tablet Take 0.5 mg by mouth every morning.     Historical Provider, MD  amLODipine (NORVASC) 5 MG tablet Take 5 mg by mouth daily.    Historical Provider, MD  amoxicillin-clavulanate (AUGMENTIN) 875-125 MG per tablet Take 1 tablet by mouth every 12 (twelve) hours. 12/05/14  Jola Schmidt, MD  aspirin 325 MG tablet Take 325 mg by mouth daily.    Historical Provider, MD  finasteride (PROSCAR) 5 MG tablet Take 5 mg by mouth daily.    Historical Provider, MD  gabapentin (NEURONTIN) 300 MG capsule Take 300 mg by mouth at bedtime.     Historical Provider, MD  losartan (COZAAR) 100 MG tablet Take 100 mg by mouth daily.    Historical Provider, MD  Lutein 6 MG TABS Take 6 mg by mouth daily.     Historical Provider, MD  Multiple Vitamin (MULTIVITAMIN) tablet Take 1 tablet by mouth daily.    Historical Provider, MD  oxyCODONE (OXY IR/ROXICODONE) 5 MG immediate release tablet Take 1-3 tablets (5-15 mg total) by mouth every 4 (four)  hours as needed. 12/09/14   Naiping Ephriam Jenkins, MD  oxyCODONE (OXY IR/ROXICODONE) 5 MG immediate release tablet Take 1-3 tablets (5-15 mg total) by mouth every 4 (four) hours as needed. 12/10/14   Leandrew Koyanagi, MD  oxyCODONE-acetaminophen (PERCOCET/ROXICET) 5-325 MG per tablet Take 1 tablet by mouth every 4 (four) hours as needed for severe pain. Patient taking differently: Take 1 tablet by mouth at bedtime.  12/05/14   Jola Schmidt, MD  OXYGEN-HELIUM IN Inhale into the lungs. CPAP    Historical Provider, MD  pantoprazole (PROTONIX) 40 MG tablet Take 40 mg by mouth every other day.     Historical Provider, MD  senna-docusate (SENOKOT S) 8.6-50 MG per tablet Take 1 tablet by mouth at bedtime as needed. 12/09/14   Leandrew Koyanagi, MD  senna-docusate (SENOKOT S) 8.6-50 MG per tablet Take 1 tablet by mouth at bedtime as needed. 12/10/14   Naiping Ephriam Jenkins, MD   BP 153/104 mmHg  Pulse 96  Temp(Src) 98.6 F (37 C) (Oral)  Resp 20  Wt 275 lb (124.739 kg)  SpO2 100% Physical Exam  Constitutional: He appears well-developed and well-nourished. No distress.  HENT:  Head: Normocephalic and atraumatic.  Neck: Neck supple.  Cardiovascular: Normal rate, regular rhythm and intact distal pulses.   Pulmonary/Chest: Effort normal and breath sounds normal. No respiratory distress. He has no decreased breath sounds. He has no wheezes. He has no rhonchi. He has no rales. He exhibits tenderness.  Point tenderness of the left lateral/posterior ribs.  Pt has kinesiotape overlying the rib area.  No noted discoloration outside of the taped area.    Abdominal: Soft. He exhibits no distension and no mass. There is no tenderness. There is no rebound and no guarding.  obese  Musculoskeletal:  Left upper extremity in splint.  Moves fingers easily.    Lower extremities:  Strength 5/5, sensation intact, distal pulses intact.   No edema of the lower extremities.    Neurological: He is alert. He exhibits normal muscle tone.  Skin: He is  not diaphoretic.  Nursing note and vitals reviewed.   ED Course  Procedures (including critical care time) Labs Review Labs Reviewed - No data to display  Imaging Review Dg Ribs Unilateral W/chest Left  12/14/2014   CLINICAL DATA:  Recent fall with left chest pain, initial encounter  EXAM: LEFT RIBS AND CHEST - 3+ VIEW  COMPARISON:  12/05/2014  FINDINGS: Cardiac shadow is within normal limits. Left rib cage is well visualized. There is a mildly displaced fracture of the left eighth rib posteriorly and anteriorly. Similar changes are noted in the ninth rib posteriorly. No pneumothorax is seen. The lungs are clear bilaterally.  IMPRESSION: Fractures of the left  eighth and ninth ribs not well appreciated on prior exams. No associated pneumothorax is noted.   Electronically Signed   By: Inez Catalina M.D.   On: 12/14/2014 08:22     EKG Interpretation None     Discussed pt with Dr Eulis Foster who has also seen the patient.    Definitive fracture care provided for this patient including pain management, incentive spirometry, return precautions.    MDM   Final diagnoses:  Rib fractures, left, closed, initial encounter    Afebrile nontoxic patient who fell 10 feet off a ladder onto concrete 9 days ago, surgery on left arm 5 days ago p/w worsening left rib pain without SOB, hemoptysis.  He has point tenderness over the area.  Pt expressed concern about blood clots and has had slight edema of left lower extremity with altered sensation this morning.  Exam of LE is unremarkable.  Venous duplex negative.  Doubt PE.  Pt has point tenderness over left ribs and his O2 is 100%, CXR shows fractures of 8th and 9th ribs.  Incentive spirometer given.  Discussed pain control- pt has felt too sedated with percocet and flexeril, will try robaxin.  D/C with robaxin, orthopedic follow up as previously planned.  Discussed result, findings, treatment, and follow up  with patient.  Pt given return precautions.  Pt  verbalizes understanding and agrees with plan.        Clayton Bibles, PA-C 12/14/14 [place], MD 12/14/14 [phone]

## 2014-12-25 DIAGNOSIS — S52532D Colles' fracture of left radius, subsequent encounter for closed fracture with routine healing: Secondary | ICD-10-CM | POA: Diagnosis not present

## 2014-12-25 DIAGNOSIS — S42322D Displaced transverse fracture of shaft of humerus, left arm, subsequent encounter for fracture with routine healing: Secondary | ICD-10-CM | POA: Diagnosis not present

## 2014-12-30 DIAGNOSIS — M25522 Pain in left elbow: Secondary | ICD-10-CM | POA: Diagnosis not present

## 2014-12-30 DIAGNOSIS — M25642 Stiffness of left hand, not elsewhere classified: Secondary | ICD-10-CM | POA: Diagnosis not present

## 2014-12-30 DIAGNOSIS — M25512 Pain in left shoulder: Secondary | ICD-10-CM | POA: Diagnosis not present

## 2014-12-30 DIAGNOSIS — M25612 Stiffness of left shoulder, not elsewhere classified: Secondary | ICD-10-CM | POA: Diagnosis not present

## 2015-01-01 DIAGNOSIS — M25512 Pain in left shoulder: Secondary | ICD-10-CM | POA: Diagnosis not present

## 2015-01-01 DIAGNOSIS — M25612 Stiffness of left shoulder, not elsewhere classified: Secondary | ICD-10-CM | POA: Diagnosis not present

## 2015-01-01 DIAGNOSIS — M25642 Stiffness of left hand, not elsewhere classified: Secondary | ICD-10-CM | POA: Diagnosis not present

## 2015-01-01 DIAGNOSIS — M25522 Pain in left elbow: Secondary | ICD-10-CM | POA: Diagnosis not present

## 2015-01-05 DIAGNOSIS — M25642 Stiffness of left hand, not elsewhere classified: Secondary | ICD-10-CM | POA: Diagnosis not present

## 2015-01-05 DIAGNOSIS — M25522 Pain in left elbow: Secondary | ICD-10-CM | POA: Diagnosis not present

## 2015-01-05 DIAGNOSIS — M25612 Stiffness of left shoulder, not elsewhere classified: Secondary | ICD-10-CM | POA: Diagnosis not present

## 2015-01-05 DIAGNOSIS — M25512 Pain in left shoulder: Secondary | ICD-10-CM | POA: Diagnosis not present

## 2015-01-06 DIAGNOSIS — M25522 Pain in left elbow: Secondary | ICD-10-CM | POA: Diagnosis not present

## 2015-01-06 DIAGNOSIS — M25512 Pain in left shoulder: Secondary | ICD-10-CM | POA: Diagnosis not present

## 2015-01-06 DIAGNOSIS — M25612 Stiffness of left shoulder, not elsewhere classified: Secondary | ICD-10-CM | POA: Diagnosis not present

## 2015-01-06 DIAGNOSIS — M25642 Stiffness of left hand, not elsewhere classified: Secondary | ICD-10-CM | POA: Diagnosis not present

## 2015-01-08 DIAGNOSIS — M25612 Stiffness of left shoulder, not elsewhere classified: Secondary | ICD-10-CM | POA: Diagnosis not present

## 2015-01-08 DIAGNOSIS — M25522 Pain in left elbow: Secondary | ICD-10-CM | POA: Diagnosis not present

## 2015-01-08 DIAGNOSIS — M25512 Pain in left shoulder: Secondary | ICD-10-CM | POA: Diagnosis not present

## 2015-01-08 DIAGNOSIS — M25642 Stiffness of left hand, not elsewhere classified: Secondary | ICD-10-CM | POA: Diagnosis not present

## 2015-01-11 ENCOUNTER — Other Ambulatory Visit: Payer: Self-pay

## 2015-01-13 DIAGNOSIS — M25522 Pain in left elbow: Secondary | ICD-10-CM | POA: Diagnosis not present

## 2015-01-13 DIAGNOSIS — M25642 Stiffness of left hand, not elsewhere classified: Secondary | ICD-10-CM | POA: Diagnosis not present

## 2015-01-13 DIAGNOSIS — M25512 Pain in left shoulder: Secondary | ICD-10-CM | POA: Diagnosis not present

## 2015-01-13 DIAGNOSIS — M25612 Stiffness of left shoulder, not elsewhere classified: Secondary | ICD-10-CM | POA: Diagnosis not present

## 2015-01-15 DIAGNOSIS — E785 Hyperlipidemia, unspecified: Secondary | ICD-10-CM | POA: Diagnosis not present

## 2015-01-15 DIAGNOSIS — L918 Other hypertrophic disorders of the skin: Secondary | ICD-10-CM | POA: Diagnosis not present

## 2015-01-15 DIAGNOSIS — F419 Anxiety disorder, unspecified: Secondary | ICD-10-CM | POA: Diagnosis not present

## 2015-01-15 DIAGNOSIS — I10 Essential (primary) hypertension: Secondary | ICD-10-CM | POA: Diagnosis not present

## 2015-01-15 DIAGNOSIS — K219 Gastro-esophageal reflux disease without esophagitis: Secondary | ICD-10-CM | POA: Diagnosis not present

## 2015-01-22 DIAGNOSIS — S42322D Displaced transverse fracture of shaft of humerus, left arm, subsequent encounter for fracture with routine healing: Secondary | ICD-10-CM | POA: Diagnosis not present

## 2015-01-22 DIAGNOSIS — S52532D Colles' fracture of left radius, subsequent encounter for closed fracture with routine healing: Secondary | ICD-10-CM | POA: Diagnosis not present

## 2015-01-27 DIAGNOSIS — M25512 Pain in left shoulder: Secondary | ICD-10-CM | POA: Diagnosis not present

## 2015-01-27 DIAGNOSIS — M25642 Stiffness of left hand, not elsewhere classified: Secondary | ICD-10-CM | POA: Diagnosis not present

## 2015-01-27 DIAGNOSIS — M25522 Pain in left elbow: Secondary | ICD-10-CM | POA: Diagnosis not present

## 2015-01-27 DIAGNOSIS — M25612 Stiffness of left shoulder, not elsewhere classified: Secondary | ICD-10-CM | POA: Diagnosis not present

## 2015-01-29 DIAGNOSIS — M25642 Stiffness of left hand, not elsewhere classified: Secondary | ICD-10-CM | POA: Diagnosis not present

## 2015-01-29 DIAGNOSIS — M25612 Stiffness of left shoulder, not elsewhere classified: Secondary | ICD-10-CM | POA: Diagnosis not present

## 2015-01-29 DIAGNOSIS — M25512 Pain in left shoulder: Secondary | ICD-10-CM | POA: Diagnosis not present

## 2015-01-29 DIAGNOSIS — M25522 Pain in left elbow: Secondary | ICD-10-CM | POA: Diagnosis not present

## 2015-02-01 DIAGNOSIS — M25642 Stiffness of left hand, not elsewhere classified: Secondary | ICD-10-CM | POA: Diagnosis not present

## 2015-02-01 DIAGNOSIS — M25512 Pain in left shoulder: Secondary | ICD-10-CM | POA: Diagnosis not present

## 2015-02-01 DIAGNOSIS — M25522 Pain in left elbow: Secondary | ICD-10-CM | POA: Diagnosis not present

## 2015-02-01 DIAGNOSIS — M25612 Stiffness of left shoulder, not elsewhere classified: Secondary | ICD-10-CM | POA: Diagnosis not present

## 2015-02-04 DIAGNOSIS — M25612 Stiffness of left shoulder, not elsewhere classified: Secondary | ICD-10-CM | POA: Diagnosis not present

## 2015-02-04 DIAGNOSIS — M25642 Stiffness of left hand, not elsewhere classified: Secondary | ICD-10-CM | POA: Diagnosis not present

## 2015-02-04 DIAGNOSIS — M25512 Pain in left shoulder: Secondary | ICD-10-CM | POA: Diagnosis not present

## 2015-02-04 DIAGNOSIS — M25522 Pain in left elbow: Secondary | ICD-10-CM | POA: Diagnosis not present

## 2015-02-09 DIAGNOSIS — M25512 Pain in left shoulder: Secondary | ICD-10-CM | POA: Diagnosis not present

## 2015-02-09 DIAGNOSIS — M25612 Stiffness of left shoulder, not elsewhere classified: Secondary | ICD-10-CM | POA: Diagnosis not present

## 2015-02-09 DIAGNOSIS — M25522 Pain in left elbow: Secondary | ICD-10-CM | POA: Diagnosis not present

## 2015-02-09 DIAGNOSIS — M25642 Stiffness of left hand, not elsewhere classified: Secondary | ICD-10-CM | POA: Diagnosis not present

## 2015-02-11 DIAGNOSIS — M25612 Stiffness of left shoulder, not elsewhere classified: Secondary | ICD-10-CM | POA: Diagnosis not present

## 2015-02-11 DIAGNOSIS — M25512 Pain in left shoulder: Secondary | ICD-10-CM | POA: Diagnosis not present

## 2015-02-11 DIAGNOSIS — M25522 Pain in left elbow: Secondary | ICD-10-CM | POA: Diagnosis not present

## 2015-02-11 DIAGNOSIS — M25642 Stiffness of left hand, not elsewhere classified: Secondary | ICD-10-CM | POA: Diagnosis not present

## 2015-02-16 DIAGNOSIS — M25612 Stiffness of left shoulder, not elsewhere classified: Secondary | ICD-10-CM | POA: Diagnosis not present

## 2015-02-16 DIAGNOSIS — M25522 Pain in left elbow: Secondary | ICD-10-CM | POA: Diagnosis not present

## 2015-02-16 DIAGNOSIS — M25512 Pain in left shoulder: Secondary | ICD-10-CM | POA: Diagnosis not present

## 2015-02-16 DIAGNOSIS — M25642 Stiffness of left hand, not elsewhere classified: Secondary | ICD-10-CM | POA: Diagnosis not present

## 2015-02-19 DIAGNOSIS — I471 Supraventricular tachycardia: Secondary | ICD-10-CM | POA: Diagnosis not present

## 2015-02-19 DIAGNOSIS — R002 Palpitations: Secondary | ICD-10-CM | POA: Diagnosis not present

## 2015-02-19 DIAGNOSIS — R079 Chest pain, unspecified: Secondary | ICD-10-CM | POA: Diagnosis not present

## 2015-02-23 DIAGNOSIS — M25522 Pain in left elbow: Secondary | ICD-10-CM | POA: Diagnosis not present

## 2015-02-23 DIAGNOSIS — M25512 Pain in left shoulder: Secondary | ICD-10-CM | POA: Diagnosis not present

## 2015-02-23 DIAGNOSIS — M25612 Stiffness of left shoulder, not elsewhere classified: Secondary | ICD-10-CM | POA: Diagnosis not present

## 2015-02-23 DIAGNOSIS — M25642 Stiffness of left hand, not elsewhere classified: Secondary | ICD-10-CM | POA: Diagnosis not present

## 2015-02-26 DIAGNOSIS — M25512 Pain in left shoulder: Secondary | ICD-10-CM | POA: Diagnosis not present

## 2015-02-26 DIAGNOSIS — M25612 Stiffness of left shoulder, not elsewhere classified: Secondary | ICD-10-CM | POA: Diagnosis not present

## 2015-02-26 DIAGNOSIS — M25522 Pain in left elbow: Secondary | ICD-10-CM | POA: Diagnosis not present

## 2015-02-26 DIAGNOSIS — M25642 Stiffness of left hand, not elsewhere classified: Secondary | ICD-10-CM | POA: Diagnosis not present

## 2015-03-01 DIAGNOSIS — M25612 Stiffness of left shoulder, not elsewhere classified: Secondary | ICD-10-CM | POA: Diagnosis not present

## 2015-03-01 DIAGNOSIS — M25522 Pain in left elbow: Secondary | ICD-10-CM | POA: Diagnosis not present

## 2015-03-01 DIAGNOSIS — M25642 Stiffness of left hand, not elsewhere classified: Secondary | ICD-10-CM | POA: Diagnosis not present

## 2015-03-01 DIAGNOSIS — M25512 Pain in left shoulder: Secondary | ICD-10-CM | POA: Diagnosis not present

## 2015-03-04 DIAGNOSIS — E668 Other obesity: Secondary | ICD-10-CM | POA: Diagnosis not present

## 2015-03-04 DIAGNOSIS — I10 Essential (primary) hypertension: Secondary | ICD-10-CM | POA: Diagnosis not present

## 2015-03-04 DIAGNOSIS — E785 Hyperlipidemia, unspecified: Secondary | ICD-10-CM | POA: Diagnosis not present

## 2015-03-04 DIAGNOSIS — I471 Supraventricular tachycardia: Secondary | ICD-10-CM | POA: Diagnosis not present

## 2015-03-04 DIAGNOSIS — I479 Paroxysmal tachycardia, unspecified: Secondary | ICD-10-CM | POA: Diagnosis not present

## 2015-03-05 DIAGNOSIS — I471 Supraventricular tachycardia: Secondary | ICD-10-CM | POA: Diagnosis not present

## 2015-03-05 DIAGNOSIS — M25642 Stiffness of left hand, not elsewhere classified: Secondary | ICD-10-CM | POA: Diagnosis not present

## 2015-03-05 DIAGNOSIS — E668 Other obesity: Secondary | ICD-10-CM | POA: Diagnosis not present

## 2015-03-05 DIAGNOSIS — M25612 Stiffness of left shoulder, not elsewhere classified: Secondary | ICD-10-CM | POA: Diagnosis not present

## 2015-03-05 DIAGNOSIS — I10 Essential (primary) hypertension: Secondary | ICD-10-CM | POA: Diagnosis not present

## 2015-03-05 DIAGNOSIS — M25512 Pain in left shoulder: Secondary | ICD-10-CM | POA: Diagnosis not present

## 2015-03-05 DIAGNOSIS — M25522 Pain in left elbow: Secondary | ICD-10-CM | POA: Diagnosis not present

## 2015-03-05 DIAGNOSIS — G4733 Obstructive sleep apnea (adult) (pediatric): Secondary | ICD-10-CM | POA: Diagnosis not present

## 2015-03-05 DIAGNOSIS — E785 Hyperlipidemia, unspecified: Secondary | ICD-10-CM | POA: Diagnosis not present

## 2015-03-05 DIAGNOSIS — S42322D Displaced transverse fracture of shaft of humerus, left arm, subsequent encounter for fracture with routine healing: Secondary | ICD-10-CM | POA: Diagnosis not present

## 2015-03-08 DIAGNOSIS — M79602 Pain in left arm: Secondary | ICD-10-CM | POA: Diagnosis not present

## 2015-03-08 DIAGNOSIS — I471 Supraventricular tachycardia: Secondary | ICD-10-CM | POA: Diagnosis not present

## 2015-03-09 DIAGNOSIS — M25522 Pain in left elbow: Secondary | ICD-10-CM | POA: Diagnosis not present

## 2015-03-09 DIAGNOSIS — M25512 Pain in left shoulder: Secondary | ICD-10-CM | POA: Diagnosis not present

## 2015-03-09 DIAGNOSIS — M25612 Stiffness of left shoulder, not elsewhere classified: Secondary | ICD-10-CM | POA: Diagnosis not present

## 2015-03-09 DIAGNOSIS — M25642 Stiffness of left hand, not elsewhere classified: Secondary | ICD-10-CM | POA: Diagnosis not present

## 2015-03-12 DIAGNOSIS — M25512 Pain in left shoulder: Secondary | ICD-10-CM | POA: Diagnosis not present

## 2015-03-12 DIAGNOSIS — M25522 Pain in left elbow: Secondary | ICD-10-CM | POA: Diagnosis not present

## 2015-03-12 DIAGNOSIS — M25642 Stiffness of left hand, not elsewhere classified: Secondary | ICD-10-CM | POA: Diagnosis not present

## 2015-03-12 DIAGNOSIS — M25612 Stiffness of left shoulder, not elsewhere classified: Secondary | ICD-10-CM | POA: Diagnosis not present

## 2015-03-16 DIAGNOSIS — M25642 Stiffness of left hand, not elsewhere classified: Secondary | ICD-10-CM | POA: Diagnosis not present

## 2015-03-16 DIAGNOSIS — I471 Supraventricular tachycardia: Secondary | ICD-10-CM | POA: Diagnosis not present

## 2015-03-16 DIAGNOSIS — M25512 Pain in left shoulder: Secondary | ICD-10-CM | POA: Diagnosis not present

## 2015-03-16 DIAGNOSIS — M25612 Stiffness of left shoulder, not elsewhere classified: Secondary | ICD-10-CM | POA: Diagnosis not present

## 2015-03-16 DIAGNOSIS — M25522 Pain in left elbow: Secondary | ICD-10-CM | POA: Diagnosis not present

## 2015-03-23 DIAGNOSIS — H40053 Ocular hypertension, bilateral: Secondary | ICD-10-CM | POA: Diagnosis not present

## 2015-03-23 DIAGNOSIS — H524 Presbyopia: Secondary | ICD-10-CM | POA: Diagnosis not present

## 2015-03-23 DIAGNOSIS — D2312 Other benign neoplasm of skin of left eyelid, including canthus: Secondary | ICD-10-CM | POA: Diagnosis not present

## 2015-04-05 DIAGNOSIS — M25642 Stiffness of left hand, not elsewhere classified: Secondary | ICD-10-CM | POA: Diagnosis not present

## 2015-04-05 DIAGNOSIS — M25522 Pain in left elbow: Secondary | ICD-10-CM | POA: Diagnosis not present

## 2015-04-05 DIAGNOSIS — M25512 Pain in left shoulder: Secondary | ICD-10-CM | POA: Diagnosis not present

## 2015-04-05 DIAGNOSIS — M25612 Stiffness of left shoulder, not elsewhere classified: Secondary | ICD-10-CM | POA: Diagnosis not present

## 2015-04-06 DIAGNOSIS — E668 Other obesity: Secondary | ICD-10-CM | POA: Diagnosis not present

## 2015-04-06 DIAGNOSIS — I119 Hypertensive heart disease without heart failure: Secondary | ICD-10-CM | POA: Diagnosis not present

## 2015-04-06 DIAGNOSIS — I471 Supraventricular tachycardia: Secondary | ICD-10-CM | POA: Diagnosis not present

## 2015-04-06 DIAGNOSIS — E785 Hyperlipidemia, unspecified: Secondary | ICD-10-CM | POA: Diagnosis not present

## 2015-04-06 DIAGNOSIS — G4733 Obstructive sleep apnea (adult) (pediatric): Secondary | ICD-10-CM | POA: Diagnosis not present

## 2015-04-07 DIAGNOSIS — M25612 Stiffness of left shoulder, not elsewhere classified: Secondary | ICD-10-CM | POA: Diagnosis not present

## 2015-04-07 DIAGNOSIS — M25642 Stiffness of left hand, not elsewhere classified: Secondary | ICD-10-CM | POA: Diagnosis not present

## 2015-04-07 DIAGNOSIS — M25522 Pain in left elbow: Secondary | ICD-10-CM | POA: Diagnosis not present

## 2015-04-07 DIAGNOSIS — M25512 Pain in left shoulder: Secondary | ICD-10-CM | POA: Diagnosis not present

## 2015-04-12 ENCOUNTER — Other Ambulatory Visit (HOSPITAL_COMMUNITY): Payer: Self-pay | Admitting: Orthopaedic Surgery

## 2015-04-12 DIAGNOSIS — S42322K Displaced transverse fracture of shaft of humerus, left arm, subsequent encounter for fracture with nonunion: Secondary | ICD-10-CM | POA: Diagnosis not present

## 2015-04-16 ENCOUNTER — Encounter (HOSPITAL_COMMUNITY): Payer: Self-pay

## 2015-04-16 ENCOUNTER — Encounter (HOSPITAL_COMMUNITY)
Admission: RE | Admit: 2015-04-16 | Discharge: 2015-04-16 | Disposition: A | Discharge status: Home / Self Care | Payer: Medicare Other | Source: Ambulatory Visit | Attending: Orthopaedic Surgery | Admitting: Orthopaedic Surgery

## 2015-04-16 DIAGNOSIS — Z01818 Encounter for other preprocedural examination: Secondary | ICD-10-CM | POA: Insufficient documentation

## 2015-04-16 HISTORY — DX: Cardiac arrhythmia, unspecified: I49.9

## 2015-04-16 LAB — BASIC METABOLIC PANEL
ANION GAP: 5 (ref 5–15)
BUN: 15 mg/dL (ref 6–20)
CO2: 29 mmol/L (ref 22–32)
Calcium: 9.4 mg/dL (ref 8.9–10.3)
Chloride: 103 mmol/L (ref 101–111)
Creatinine, Ser: 0.93 mg/dL (ref 0.61–1.24)
GFR calc Af Amer: >60 mL/min (ref >60)
GFR calc non Af Amer: >60 mL/min (ref >60)
GLUCOSE: 167 mg/dL — AB (ref 65–99)
POTASSIUM: 4.2 mmol/L (ref 3.5–5.1)
Sodium: 137 mmol/L (ref 135–145)

## 2015-04-16 LAB — CBC
HEMATOCRIT: 42.1 % (ref 39.0–52.0)
HEMOGLOBIN: 14.4 g/dL (ref 13.0–17.0)
MCH: 31.9 pg (ref 26.0–34.0)
MCHC: 34.2 g/dL (ref 30.0–36.0)
MCV: 93.1 fL (ref 78.0–100.0)
Platelets: 212 10*3/uL (ref 150–400)
RBC: 4.52 MIL/uL (ref 4.22–5.81)
RDW: 13.6 % (ref 11.5–15.5)
WBC: 6.4 10*3/uL (ref 4.0–10.5)

## 2015-04-16 NOTE — Progress Notes (Signed)
req'd echo, stress, notes from dr Wynonia Lawman. Recent visit

## 2015-04-16 NOTE — Pre-Procedure Instructions (Addendum)
Tanner Garza Line  04/16/2015      [address], [place] [address] Phone: [phone] Fax: [phone]    Your procedure is scheduled on 04/20/15.  Report to [facility] short stay admitting at 1000 A.M.  Call this number if you have problems the morning of surgery:  [phone]   Remember:  Do not eat food or drink liquids after midnight.  Take these medicines the morning of surgery with A SIP OF WATER xanax ,tylenol,if needed, amlodipine      STOP all herbel meds, nsaids (aleve,naproxen,advil,ibuprofen) TODAY including multi vit, lutein, aspirin,meloxicam   Do not wear jewelry, make-up or nail polish.  Do not wear lotions, powders, or perfumes.  You may wear deodorant.  Do not shave 48 hours prior to surgery.  Men may shave face and neck.  Do not bring valuables to the hospital.  [facility] is not responsible for any belongings or valuables.  Contacts, dentures or bridgework may not be worn into surgery.  Leave your suitcase in the car.  After surgery it may be brought to your room.  For patients admitted to the hospital, discharge time will be determined by your treatment team.  Patients discharged the day of surgery will not be allowed to drive home.   Name and phone number of your driver:    Special instructions:   Special Instructions:  - Preparing for Surgery  Before surgery, you can play an important role.  Because skin is not sterile, your skin needs to be as free of germs as possible.  You can reduce the number of germs on you skin by washing with CHG (chlorahexidine gluconate) soap before surgery.  CHG is an antiseptic cleaner which kills germs and bonds with the skin to continue killing germs even after washing.  Please DO NOT use if you have an allergy to CHG or antibacterial soaps.  If your skin becomes reddened/irritated stop using the CHG and inform your nurse when you arrive at Short  Stay.  Do not shave (including legs and underarms) for at least 48 hours prior to the first CHG shower.  You may shave your face.  Please follow these instructions carefully:   1.  Shower with CHG Soap the night before surgery and the morning of Surgery.  2.  If you choose to wash your hair, wash your hair first as usual with your normal shampoo.  3.  After you shampoo, rinse your hair and body thoroughly to remove the Shampoo.  4.  Use CHG as you would any other liquid soap.  You can apply chg directly  to the skin and wash gently with scrungie or a clean washcloth.  5.  Apply the CHG Soap to your body ONLY FROM THE NECK DOWN.  Do not use on open wounds or open sores.  Avoid contact with your eyes ears, mouth and genitals (private parts).  Wash genitals (private parts)       with your normal soap.  6.  Wash thoroughly, paying special attention to the area where your surgery will be performed.  7.  Thoroughly rinse your body with warm water from the neck down.  8.  DO NOT shower/wash with your normal soap after using and rinsing off the CHG Soap.  9.  Pat yourself dry with a clean towel.            10.  Wear clean pajamas.  11.  Place clean sheets on your bed the night of your first shower and do not sleep with pets.  Day of Surgery  Do not apply any lotions/deodorants the morning of surgery.  Please wear clean clothes to the hospital/surgery center.  Please read over the following fact sheets that you were given. Pain Booklet, Coughing and Deep Breathing and Surgical Site Infection Prevention

## 2015-04-19 NOTE — Progress Notes (Signed)
Anesthesia Chart Review: Patient is a 69 year old male scheduled for ORIF, left humerus, non-union on 04/20/15 by Dr. Erlinda Hong.  History includes smoking, HTN, BPH, anxiety, esophageal reflux, OSA with CPAP/O2, SVT/tachycardia X 2 (one being 04/2013 treated with adenosine, [facility]), cholecystectomy'03, appendectomy '06, right thumb saw injury s/p repair '08, s/p ORIF left humerus fracture and distal radius fracture and I&D of left long finger PIP open dislocation 12/09/14. PCP is Dr. Ernestine Conrad. Cardiologist is Dr. Tollie Eth, last visit 04/06/15. BP control was better with spironolactone. No further SVT episodes. 6 month cardiology follow-up recommended.  Meds include Xanax, amlodipine, ASA 81 mg, Avodart, Neurontin, Protonix, spironolactone.   12/05/14 EKG: SR.  03/08/15 Echo (Dr. Wynonia Lawman): Severe concentric LVH with lower limits of normal systolic function, estimated EF 50%. Doppler evidence of grade 1 diastolic dysfunction. Mild LAE. Trace TR. IVC is dilated with a respiratory response of < 50%.   03/16/15 ETT (Dr. Wynonia Lawman): Clinically and EKG negative for ischemia. Poor exercise tolerance for age. Recommend: BP control, weight reduction, salt reduction, and treatment of OSA. Titrate medicine to try to achieve target BP of 130/80.  According to 04/06/15 note by Dr. Wynonia Lawman, "The event monitor they were the past month did not show any episodes of SVT or arrhythmia."  12/05/14 1V CXR:  IMPRESSION: No evidence of acute cardiopulmonary disease.  Preoperative labs noted.   If no acute changes then I anticipate that he can proceed as planned.  George Hugh [facility] Short Stay Center/Anesthesiology Phone [phone] 04/19/2015 10:26 AM

## 2015-04-20 ENCOUNTER — Inpatient Hospital Stay (HOSPITAL_COMMUNITY): Payer: Medicare Other

## 2015-04-20 ENCOUNTER — Encounter (HOSPITAL_COMMUNITY)
Admission: RE | Disposition: A | Discharge status: Home Health | Payer: Self-pay | Source: Ambulatory Visit | Attending: Orthopaedic Surgery

## 2015-04-20 ENCOUNTER — Inpatient Hospital Stay (HOSPITAL_COMMUNITY): Payer: Medicare Other | Admitting: Vascular Surgery

## 2015-04-20 ENCOUNTER — Inpatient Hospital Stay (HOSPITAL_COMMUNITY): Payer: Medicare Other | Admitting: Anesthesiology

## 2015-04-20 ENCOUNTER — Encounter (HOSPITAL_COMMUNITY): Payer: Self-pay | Admitting: Certified Registered Nurse Anesthetist

## 2015-04-20 ENCOUNTER — Inpatient Hospital Stay (HOSPITAL_COMMUNITY)
Admission: RE | Admit: 2015-04-20 | Discharge: 2015-04-23 | DRG: 493 | Disposition: A | Discharge status: Home Health | Payer: Medicare Other | Source: Ambulatory Visit | Attending: Orthopaedic Surgery | Admitting: Orthopaedic Surgery

## 2015-04-20 DIAGNOSIS — E785 Hyperlipidemia, unspecified: Secondary | ICD-10-CM | POA: Diagnosis present

## 2015-04-20 DIAGNOSIS — S42402K Unspecified fracture of lower end of left humerus, subsequent encounter for fracture with nonunion: Secondary | ICD-10-CM | POA: Diagnosis not present

## 2015-04-20 DIAGNOSIS — B958 Unspecified staphylococcus as the cause of diseases classified elsewhere: Secondary | ICD-10-CM | POA: Insufficient documentation

## 2015-04-20 DIAGNOSIS — F17200 Nicotine dependence, unspecified, uncomplicated: Secondary | ICD-10-CM | POA: Diagnosis not present

## 2015-04-20 DIAGNOSIS — D62 Acute posthemorrhagic anemia: Secondary | ICD-10-CM | POA: Diagnosis not present

## 2015-04-20 DIAGNOSIS — G4733 Obstructive sleep apnea (adult) (pediatric): Secondary | ICD-10-CM | POA: Diagnosis present

## 2015-04-20 DIAGNOSIS — T847XXA Infection and inflammatory reaction due to other internal orthopedic prosthetic devices, implants and grafts, initial encounter: Secondary | ICD-10-CM | POA: Diagnosis not present

## 2015-04-20 DIAGNOSIS — I10 Essential (primary) hypertension: Secondary | ICD-10-CM | POA: Diagnosis present

## 2015-04-20 DIAGNOSIS — S42309A Unspecified fracture of shaft of humerus, unspecified arm, initial encounter for closed fracture: Secondary | ICD-10-CM | POA: Diagnosis present

## 2015-04-20 DIAGNOSIS — S42302K Unspecified fracture of shaft of humerus, left arm, subsequent encounter for fracture with nonunion: Secondary | ICD-10-CM | POA: Diagnosis not present

## 2015-04-20 DIAGNOSIS — M869 Osteomyelitis, unspecified: Secondary | ICD-10-CM | POA: Diagnosis not present

## 2015-04-20 DIAGNOSIS — Z419 Encounter for procedure for purposes other than remedying health state, unspecified: Secondary | ICD-10-CM

## 2015-04-20 DIAGNOSIS — Z9889 Other specified postprocedural states: Secondary | ICD-10-CM | POA: Diagnosis not present

## 2015-04-20 DIAGNOSIS — T84611D Infection and inflammatory reaction due to internal fixation device of left humerus, subsequent encounter: Secondary | ICD-10-CM | POA: Diagnosis not present

## 2015-04-20 DIAGNOSIS — T84611A Infection and inflammatory reaction due to internal fixation device of left humerus, initial encounter: Principal | ICD-10-CM | POA: Diagnosis present

## 2015-04-20 DIAGNOSIS — S52502K Unspecified fracture of the lower end of left radius, subsequent encounter for closed fracture with nonunion: Secondary | ICD-10-CM | POA: Diagnosis not present

## 2015-04-20 DIAGNOSIS — S42322K Displaced transverse fracture of shaft of humerus, left arm, subsequent encounter for fracture with nonunion: Secondary | ICD-10-CM | POA: Diagnosis not present

## 2015-04-20 DIAGNOSIS — L309 Dermatitis, unspecified: Secondary | ICD-10-CM | POA: Diagnosis present

## 2015-04-20 DIAGNOSIS — K219 Gastro-esophageal reflux disease without esophagitis: Secondary | ICD-10-CM | POA: Diagnosis present

## 2015-04-20 HISTORY — PX: ORIF HUMERUS FRACTURE: SHX2126

## 2015-04-20 SURGERY — OPEN REDUCTION INTERNAL FIXATION (ORIF) HUMERAL SHAFT FRACTURE
Anesthesia: Regional | Laterality: Left

## 2015-04-20 MED ORDER — ACETAMINOPHEN 325 MG PO TABS
650.0000 mg | ORAL_TABLET | Freq: Four times a day (QID) | ORAL | Status: DC | PRN
  Administered 2015-04-20 – 2015-04-21 (×2): 650 mg via ORAL
  Filled 2015-04-20 (×3): qty 2

## 2015-04-20 MED ORDER — SODIUM CHLORIDE 0.9 % IV SOLN
INTRAVENOUS | Status: DC
  Administered 2015-04-20: 18:00:00 via INTRAVENOUS

## 2015-04-20 MED ORDER — SPIRONOLACTONE 25 MG PO TABS
25.0000 mg | ORAL_TABLET | Freq: Every day | ORAL | Status: DC
  Administered 2015-04-21 – 2015-04-23 (×3): 25 mg via ORAL
  Filled 2015-04-20 (×5): qty 1

## 2015-04-20 MED ORDER — DIPHENHYDRAMINE HCL 12.5 MG/5ML PO ELIX: 25.0000 mg | ORAL_SOLUTION | ORAL | Status: DC | PRN

## 2015-04-20 MED ORDER — ACETAMINOPHEN 650 MG RE SUPP: 650.0000 mg | Freq: Four times a day (QID) | RECTAL | Status: DC | PRN

## 2015-04-20 MED ORDER — DEXTROSE 5 % IV SOLN
500.0000 mg | Freq: Four times a day (QID) | INTRAVENOUS | Status: DC | PRN
  Filled 2015-04-20: qty 5

## 2015-04-20 MED ORDER — ONDANSETRON HCL 4 MG PO TABS: 4.0000 mg | ORAL_TABLET | Freq: Four times a day (QID) | ORAL | Status: DC | PRN

## 2015-04-20 MED ORDER — LUTEIN 6 MG PO TABS: 6.0000 mg | ORAL_TABLET | Freq: Every day | ORAL | Status: DC

## 2015-04-20 MED ORDER — METHOCARBAMOL 500 MG PO TABS
500.0000 mg | ORAL_TABLET | Freq: Four times a day (QID) | ORAL | Status: DC | PRN
  Filled 2015-04-20 (×2): qty 1

## 2015-04-20 MED ORDER — OXYCODONE HCL 5 MG PO TABS
5.0000 mg | ORAL_TABLET | ORAL | Status: DC | PRN
  Filled 2015-04-20 (×2): qty 2

## 2015-04-20 MED ORDER — PANTOPRAZOLE SODIUM 40 MG PO TBEC
40.0000 mg | DELAYED_RELEASE_TABLET | Freq: Every day | ORAL | Status: DC
  Administered 2015-04-20 – 2015-04-22 (×3): 40 mg via ORAL
  Filled 2015-04-20 (×4): qty 1

## 2015-04-20 MED ORDER — METOCLOPRAMIDE HCL 5 MG PO TABS: 5.0000 mg | ORAL_TABLET | Freq: Three times a day (TID) | ORAL | Status: DC | PRN

## 2015-04-20 MED ORDER — VANCOMYCIN HCL IN DEXTROSE 1-5 GM/200ML-% IV SOLN
INTRAVENOUS | Status: AC
  Filled 2015-04-20: qty 200

## 2015-04-20 MED ORDER — GLYCOPYRROLATE 0.2 MG/ML IJ SOLN
INTRAMUSCULAR | Status: DC | PRN
  Administered 2015-04-20: 0.6 mg via INTRAVENOUS

## 2015-04-20 MED ORDER — PIPERACILLIN-TAZOBACTAM 3.375 G IVPB 30 MIN
3.3750 g | Freq: Once | INTRAVENOUS | Status: AC
  Administered 2015-04-20: 3.375 g via INTRAVENOUS
  Filled 2015-04-20 (×2): qty 50

## 2015-04-20 MED ORDER — HYDROMORPHONE HCL 1 MG/ML IJ SOLN
INTRAMUSCULAR | Status: AC
  Administered 2015-04-20: 0.25 mg via INTRAVENOUS
  Filled 2015-04-20: qty 1

## 2015-04-20 MED ORDER — FENTANYL CITRATE (PF) 250 MCG/5ML IJ SOLN
INTRAMUSCULAR | Status: AC
  Filled 2015-04-20: qty 5

## 2015-04-20 MED ORDER — EPHEDRINE SULFATE 50 MG/ML IJ SOLN
INTRAMUSCULAR | Status: DC | PRN
  Administered 2015-04-20 (×2): 10 mg via INTRAVENOUS

## 2015-04-20 MED ORDER — MIDAZOLAM HCL 2 MG/2ML IJ SOLN
INTRAMUSCULAR | Status: AC
  Filled 2015-04-20: qty 4

## 2015-04-20 MED ORDER — VANCOMYCIN HCL 500 MG IV SOLR
INTRAVENOUS | Status: AC
  Filled 2015-04-20: qty 1000

## 2015-04-20 MED ORDER — MEPERIDINE HCL 25 MG/ML IJ SOLN: 6.2500 mg | INTRAMUSCULAR | Status: DC | PRN

## 2015-04-20 MED ORDER — GENTAMICIN SULFATE 40 MG/ML IJ SOLN
INTRAMUSCULAR | Status: AC
  Filled 2015-04-20: qty 2

## 2015-04-20 MED ORDER — AMLODIPINE BESYLATE 5 MG PO TABS
5.0000 mg | ORAL_TABLET | Freq: Every day | ORAL | Status: DC
  Administered 2015-04-21 – 2015-04-23 (×3): 5 mg via ORAL
  Filled 2015-04-20 (×3): qty 1

## 2015-04-20 MED ORDER — ASPIRIN 81 MG PO TABS: 81.0000 mg | ORAL_TABLET | Freq: Every evening | ORAL | Status: DC

## 2015-04-20 MED ORDER — VANCOMYCIN HCL 1000 MG IV SOLR
INTRAVENOUS | Status: DC | PRN
  Administered 2015-04-20: 1000 mg

## 2015-04-20 MED ORDER — PROPOFOL 10 MG/ML IV BOLUS
INTRAVENOUS | Status: AC
  Filled 2015-04-20: qty 20

## 2015-04-20 MED ORDER — ROCURONIUM BROMIDE 100 MG/10ML IV SOLN
INTRAVENOUS | Status: DC | PRN
  Administered 2015-04-20: 20 mg via INTRAVENOUS
  Administered 2015-04-20: 50 mg via INTRAVENOUS

## 2015-04-20 MED ORDER — ONE-DAILY MULTI VITAMINS PO TABS: 1.0000 | ORAL_TABLET | Freq: Every day | ORAL | Status: DC

## 2015-04-20 MED ORDER — LACTATED RINGERS IV BOLUS (SEPSIS)
500.0000 mL | Freq: Once | INTRAVENOUS | Status: AC
  Administered 2015-04-20: 500 mL via INTRAVENOUS

## 2015-04-20 MED ORDER — MIDAZOLAM HCL 5 MG/5ML IJ SOLN
INTRAMUSCULAR | Status: DC | PRN
  Administered 2015-04-20: 2 mg via INTRAVENOUS

## 2015-04-20 MED ORDER — GABAPENTIN 300 MG PO CAPS
300.0000 mg | ORAL_CAPSULE | Freq: Every day | ORAL | Status: DC
  Administered 2015-04-20 – 2015-04-22 (×3): 300 mg via ORAL
  Filled 2015-04-20 (×3): qty 1

## 2015-04-20 MED ORDER — VANCOMYCIN HCL 1000 MG IV SOLR
INTRAVENOUS | Status: AC
  Filled 2015-04-20: qty 1000

## 2015-04-20 MED ORDER — HYDROMORPHONE HCL 1 MG/ML IJ SOLN
0.2500 mg | INTRAMUSCULAR | Status: DC | PRN
  Administered 2015-04-20 (×2): 0.5 mg via INTRAVENOUS
  Administered 2015-04-20 (×2): 0.25 mg via INTRAVENOUS
  Administered 2015-04-20: 0.5 mg via INTRAVENOUS

## 2015-04-20 MED ORDER — DUTASTERIDE 0.5 MG PO CAPS
0.5000 mg | ORAL_CAPSULE | Freq: Every day | ORAL | Status: DC
  Administered 2015-04-20 – 2015-04-22 (×3): 0.5 mg via ORAL
  Filled 2015-04-20 (×6): qty 1

## 2015-04-20 MED ORDER — PROMETHAZINE HCL 25 MG/ML IJ SOLN: 6.2500 mg | INTRAMUSCULAR | Status: DC | PRN

## 2015-04-20 MED ORDER — SUCCINYLCHOLINE CHLORIDE 20 MG/ML IJ SOLN: INTRAMUSCULAR | Status: DC | PRN

## 2015-04-20 MED ORDER — ADULT MULTIVITAMIN W/MINERALS CH
1.0000 | ORAL_TABLET | Freq: Every day | ORAL | Status: DC
  Administered 2015-04-21 – 2015-04-23 (×3): 1 via ORAL
  Filled 2015-04-20 (×3): qty 1

## 2015-04-20 MED ORDER — VANCOMYCIN HCL 10 G IV SOLR
1250.0000 mg | Freq: Three times a day (TID) | INTRAVENOUS | Status: DC
  Administered 2015-04-20 – 2015-04-21 (×4): 1250 mg via INTRAVENOUS
  Filled 2015-04-20 (×6): qty 1250

## 2015-04-20 MED ORDER — DEXAMETHASONE SODIUM PHOSPHATE 4 MG/ML IJ SOLN
INTRAMUSCULAR | Status: DC | PRN
  Administered 2015-04-20: 8 mg via INTRAVENOUS

## 2015-04-20 MED ORDER — GENTAMICIN SULFATE 40 MG/ML IJ SOLN
INTRAMUSCULAR | Status: DC | PRN
  Administered 2015-04-20: 240 mg

## 2015-04-20 MED ORDER — LIDOCAINE HCL (CARDIAC) 20 MG/ML IV SOLN: INTRAVENOUS | Status: DC | PRN

## 2015-04-20 MED ORDER — LACTATED RINGERS IV SOLN
INTRAVENOUS | Status: DC
  Administered 2015-04-20 (×2): via INTRAVENOUS

## 2015-04-20 MED ORDER — VANCOMYCIN HCL 10 G IV SOLR
1500.0000 mg | INTRAVENOUS | Status: AC
  Administered 2015-04-20: 1500 g via INTRAVENOUS
  Filled 2015-04-20: qty 1500

## 2015-04-20 MED ORDER — METOCLOPRAMIDE HCL 5 MG/ML IJ SOLN: 5.0000 mg | Freq: Three times a day (TID) | INTRAMUSCULAR | Status: DC | PRN

## 2015-04-20 MED ORDER — PROPOFOL 10 MG/ML IV BOLUS
INTRAVENOUS | Status: DC | PRN
  Administered 2015-04-20: 200 mg via INTRAVENOUS

## 2015-04-20 MED ORDER — HYDROMORPHONE HCL 1 MG/ML IJ SOLN
INTRAMUSCULAR | Status: AC
  Administered 2015-04-20: 0.5 mg via INTRAVENOUS
  Filled 2015-04-20: qty 1

## 2015-04-20 MED ORDER — PHENYLEPHRINE HCL 10 MG/ML IJ SOLN
10.0000 mg | INTRAVENOUS | Status: DC | PRN
  Administered 2015-04-20: 10 ug/min via INTRAVENOUS

## 2015-04-20 MED ORDER — ONDANSETRON HCL 4 MG/2ML IJ SOLN: 4.0000 mg | Freq: Four times a day (QID) | INTRAMUSCULAR | Status: DC | PRN

## 2015-04-20 MED ORDER — ALPRAZOLAM 0.5 MG PO TABS
0.5000 mg | ORAL_TABLET | Freq: Every morning | ORAL | Status: DC
  Administered 2015-04-21 – 2015-04-23 (×3): 0.5 mg via ORAL
  Filled 2015-04-20 (×3): qty 1

## 2015-04-20 MED ORDER — PIPERACILLIN-TAZOBACTAM 3.375 G IVPB
3.3750 g | Freq: Three times a day (TID) | INTRAVENOUS | Status: DC
  Administered 2015-04-20 – 2015-04-22 (×6): 3.375 g via INTRAVENOUS
  Filled 2015-04-20 (×9): qty 50

## 2015-04-20 MED ORDER — ONDANSETRON HCL 4 MG/2ML IJ SOLN
INTRAMUSCULAR | Status: DC | PRN
  Administered 2015-04-20: 4 mg via INTRAVENOUS

## 2015-04-20 MED ORDER — NEOSTIGMINE METHYLSULFATE 10 MG/10ML IV SOLN
INTRAVENOUS | Status: DC | PRN
  Administered 2015-04-20: 5 mg via INTRAVENOUS

## 2015-04-20 MED ORDER — SUCCINYLCHOLINE 20MG/ML (10ML) SYRINGE FOR MEDFUSION PUMP - OPTIME
INTRAMUSCULAR | Status: DC | PRN
  Administered 2015-04-20: 100 mg via INTRAVENOUS

## 2015-04-20 MED ORDER — ASPIRIN EC 81 MG PO TBEC
81.0000 mg | DELAYED_RELEASE_TABLET | Freq: Every day | ORAL | Status: DC
  Administered 2015-04-20 – 2015-04-22 (×3): 81 mg via ORAL
  Filled 2015-04-20 (×3): qty 1

## 2015-04-20 MED ORDER — BUPIVACAINE HCL (PF) 0.25 % IJ SOLN
INTRAMUSCULAR | Status: AC
  Filled 2015-04-20: qty 30

## 2015-04-20 MED ORDER — MORPHINE SULFATE (PF) 2 MG/ML IV SOLN: 2.0000 mg | INTRAVENOUS | Status: DC | PRN

## 2015-04-20 MED ORDER — FENTANYL CITRATE (PF) 100 MCG/2ML IJ SOLN
INTRAMUSCULAR | Status: DC | PRN
  Administered 2015-04-20 (×3): 50 ug via INTRAVENOUS
  Administered 2015-04-20: 100 ug via INTRAVENOUS
  Administered 2015-04-20 (×2): 50 ug via INTRAVENOUS
  Administered 2015-04-20: 100 ug via INTRAVENOUS

## 2015-04-20 SURGICAL SUPPLY — 64 items
BLADE SURG 10 STRL SS (BLADE) ×1 IMPLANT
BNDG CMPR 9X4 STRL LF SNTH (GAUZE/BANDAGES/DRESSINGS)
BNDG ESMARK 4X9 LF (GAUZE/BANDAGES/DRESSINGS) IMPLANT
BNDG GAUZE ELAST 4 BULKY (GAUZE/BANDAGES/DRESSINGS) ×1 IMPLANT
CLAMP ADJUSTABLE (Clamp) ×4 IMPLANT
CLAMP LG COMBINATION (Clamp) ×1 IMPLANT
CONT SPEC STER OR (MISCELLANEOUS) ×2 IMPLANT
COVER SURGICAL LIGHT HANDLE (MISCELLANEOUS) ×2 IMPLANT
CUFF TOURNIQUET SINGLE 18IN (TOURNIQUET CUFF) ×1 IMPLANT
CUFF TOURNIQUET SINGLE 24IN (TOURNIQUET CUFF) IMPLANT
DRAPE C-ARM 42X72 X-RAY (DRAPES) ×5 IMPLANT
DRAPE C-ARMOR (DRAPES) ×1 IMPLANT
DRAPE IMP U-DRAPE 54X76 (DRAPES) ×2 IMPLANT
DRAPE INCISE IOBAN 66X45 STRL (DRAPES) ×2 IMPLANT
DRAPE ORTHO SPLIT 77X108 STRL (DRAPES) ×2
DRAPE SURG ORHT 6 SPLT 77X108 (DRAPES) IMPLANT
DRAPE U-SHAPE 47X51 STRL (DRAPES) ×2 IMPLANT
DRSG TEGADERM 4X4.75 (GAUZE/BANDAGES/DRESSINGS) ×8 IMPLANT
ELECT CAUTERY BLADE 6.4 (BLADE) ×2 IMPLANT
ELECT REM PT RETURN 9FT ADLT (ELECTROSURGICAL) ×2
ELECTRODE REM PT RTRN 9FT ADLT (ELECTROSURGICAL) ×1 IMPLANT
FACESHIELD WRAPAROUND (MASK) ×2 IMPLANT
FACESHIELD WRAPAROUND OR TEAM (MASK) ×1 IMPLANT
GAUZE SPONGE 4X4 12PLY STRL (GAUZE/BANDAGES/DRESSINGS) ×2 IMPLANT
GAUZE XEROFORM 5X9 LF (GAUZE/BANDAGES/DRESSINGS) ×2 IMPLANT
GLOVE BIO SURGEON STRL SZ7 (GLOVE) ×3 IMPLANT
GLOVE BIOGEL PI IND STRL 6.5 (GLOVE) IMPLANT
GLOVE BIOGEL PI INDICATOR 6.5 (GLOVE) ×3
GLOVE NEODERM STRL 7.5 LF PF (GLOVE) ×2 IMPLANT
GLOVE SURG NEODERM 7.5  LF PF (GLOVE) ×2
GLOVE SURG SYN 7.5  E (GLOVE) ×3
GLOVE SURG SYN 7.5 E (GLOVE) ×3 IMPLANT
GLOVE SURG SYN 7.5 PF PI (GLOVE) IMPLANT
GOWN STRL REIN XL XLG (GOWN DISPOSABLE) ×6 IMPLANT
KIT BASIN OR (CUSTOM PROCEDURE TRAY) ×2 IMPLANT
KIT ROOM TURNOVER OR (KITS) ×2 IMPLANT
KIT STIMULAN RAPID CURE  10CC (Orthopedic Implant) ×1 IMPLANT
KIT STIMULAN RAPID CURE 10CC (Orthopedic Implant) IMPLANT
MANIFOLD NEPTUNE II (INSTRUMENTS) ×2 IMPLANT
NS IRRIG 1000ML POUR BTL (IV SOLUTION) ×2 IMPLANT
PACK SHOULDER (CUSTOM PROCEDURE TRAY) ×2 IMPLANT
PACK UNIVERSAL I (CUSTOM PROCEDURE TRAY) ×1 IMPLANT
PAD ARMBOARD 7.5X6 YLW CONV (MISCELLANEOUS) ×4 IMPLANT
PAD CAST 4YDX4 CTTN HI CHSV (CAST SUPPLIES) ×1 IMPLANT
PADDING CAST COTTON 4X4 STRL (CAST SUPPLIES) ×2
SCREW SCHNZ SD 5.0 60 THRD/150 (Screw) IMPLANT
SCRW SCHANZ SD 5.0 60 THRD/150 (Screw) ×8 IMPLANT
SLING ARM IMMOBILIZER LRG (SOFTGOODS) ×2 IMPLANT
SPONGE LAP 18X18 X RAY DECT (DISPOSABLE) ×2 IMPLANT
STAPLER VISISTAT 35W (STAPLE) IMPLANT
SUCTION FRAZIER TIP 10 FR DISP (SUCTIONS) IMPLANT
SUT ETHILON 3 0 PS 1 (SUTURE) ×4 IMPLANT
SUT MON AB 2-0 CT1 36 (SUTURE) ×2 IMPLANT
SUT PDS AB 0 CT 36 (SUTURE) ×2 IMPLANT
SUT VIC AB 0 CT1 27 (SUTURE) ×2
SUT VIC AB 0 CT1 27XBRD ANBCTR (SUTURE) ×2 IMPLANT
SUT VIC AB 2-0 CT1 27 (SUTURE) ×2
SUT VIC AB 2-0 CT1 TAPERPNT 27 (SUTURE) ×2 IMPLANT
SYR CONTROL 10ML LL (SYRINGE) IMPLANT
TOWEL OR 17X24 6PK STRL BLUE (TOWEL DISPOSABLE) IMPLANT
TOWEL OR 17X26 10 PK STRL BLUE (TOWEL DISPOSABLE) ×2 IMPLANT
TUBE ANAEROBIC SPECIMEN COL (MISCELLANEOUS) ×2 IMPLANT
UNDERPAD 30X30 INCONTINENT (UNDERPADS AND DIAPERS) ×2 IMPLANT
WATER STERILE IRR 1000ML POUR (IV SOLUTION) ×2 IMPLANT

## 2015-04-20 NOTE — H&P (Signed)
PREOPERATIVE H&P  Chief Complaint: Left humerus nonunion  HPI: Tanner Garza is a 69 y.o. male who presents for surgical treatment of Left humerus nonunion.  He denies any changes in medical history.  Past Medical History  Diagnosis Date  . Hypertension   . BPH (benign prostatic hyperplasia)   . Anxiety   . Esophageal reflux   . Bell's palsy 09/2001  . Hyperlipidemia   . Eczema   . Colon polyp   . OSA (obstructive sleep apnea)   . SVT (supraventricular tachycardia)   . Complication of anesthesia     Slow to awaken  . Dysrhythmia     2 "bouts of tachycardia   Past Surgical History  Procedure Laterality Date  . Cholecystectomy    . Appendectomy    . Replantation thumb Right   . Colonoscopy w/ polypectomy    . Orif humerus fracture Left 12/09/2014    Procedure: OPEN REDUCTION INTERNAL FIXATION (ORIF) HUMERAL SHAFT FRACTURE;  Surgeon: Leandrew Koyanagi, MD;  Location: [place];  Service: Orthopedics;  Laterality: Left;  . Open reduction internal fixation (orif) distal radial fracture Left 12/09/2014    Procedure: OPEN REDUCTION INTERNAL FIXATION (ORIF) DISTAL RADIAL FRACTURE;  Surgeon: Leandrew Koyanagi, MD;  Location: [facility];  Service: Orthopedics;  Laterality: Left;  . I&d extremity Left 12/09/2014    Procedure: IRRIGATION AND DEBRIDEMENT LEFT LONG FINGER;  Surgeon: Leandrew Koyanagi, MD;  Location: [facility];  Service: Orthopedics;  Laterality: Left;   Social History   Social History  . Marital Status: Married    Spouse Name: N/A  . Number of Children: N/A  . Years of Education: N/A   Social History Main Topics  . Smoking status: Current Every Day Smoker -- 0.50 packs/day for 45 years    Types: Cigarettes  . Smokeless tobacco: Not on file  . Alcohol Use: No  . Drug Use: No  . Sexual Activity: Not on file   Other Topics Concern  . Not on file   Social History Narrative   No family history on file. Allergies  Allergen Reactions  . Atenolol     bradycardia  . Chantix  [Varenicline]     Sedation   . Dye Fdc Red [Red Dye]     X ray dye rash  . Hctz [Hydrochlorothiazide]     Bradycardia   . Other     bandaid -rash   Prior to Admission medications   Medication Sig Start Date End Date Taking? Authorizing Provider  ALPRAZolam Duanne Moron) 0.5 MG tablet Take 0.5 mg by mouth every morning.    Yes Historical Provider, MD  amLODipine (NORVASC) 5 MG tablet Take 5 mg by mouth daily.   Yes Historical Provider, MD  aspirin 81 MG tablet Take 81 mg by mouth every evening.   Yes Historical Provider, MD  dutasteride (AVODART) 0.5 MG capsule Take 0.5 mg by mouth daily.   Yes Historical Provider, MD  gabapentin (NEURONTIN) 300 MG capsule Take 300 mg by mouth at bedtime.    Yes Historical Provider, MD  Lutein 6 MG TABS Take 6 mg by mouth daily.    Yes Historical Provider, MD  meloxicam (MOBIC) 15 MG tablet Take 15 mg by mouth every evening.   Yes Historical Provider, MD  Multiple Vitamin (MULTIVITAMIN) tablet Take 1 tablet by mouth daily.   Yes Historical Provider, MD  OXYGEN-HELIUM IN Inhale into the lungs. CPAP   Yes Historical Provider, MD  pantoprazole (PROTONIX) 40 MG  tablet Take 40 mg by mouth daily.    Yes Historical Provider, MD  spironolactone (ALDACTONE) 25 MG tablet Take 25 mg by mouth daily.   Yes Historical Provider, MD     Positive ROS: All other systems have been reviewed and were otherwise negative with the exception of those mentioned in the HPI and as above.  Physical Exam: General: Alert, no acute distress Cardiovascular: No pedal edema Respiratory: No cyanosis, no use of accessory musculature GI: abdomen soft Skin: No lesions in the area of chief complaint Neurologic: Sensation intact distally Psychiatric: Patient is competent for consent with normal mood and affect Lymphatic: no lymphedema  MUSCULOSKELETAL: exam stable  Assessment: Left humerus nonunion  Plan: Plan for Procedure(s): OPEN REDUCTION INTERNAL FIXATION (ORIF) LEFT HUMERAL  SHAFT NONUNION  The risks benefits and alternatives were discussed with the patient including but not limited to the risks of nonoperative treatment, versus surgical intervention including infection, bleeding, nerve injury,  blood clots, cardiopulmonary complications, morbidity, mortality, among others, and they were willing to proceed.   Marianna Payment, MD   04/20/2015 7:33 AM

## 2015-04-20 NOTE — Op Note (Addendum)
Date of surgery: 04/20/2015  Preoperative diagnosis: Left humerus infected nonunion  Postoperative diagnosis: Same  Operative findings: Infected nonunion with minimal callus formation and murky fluid. No gross purulence.  Procedure: 1. Irrigation and debridement of left humerus of the bone, muscle, subcutaneous tissue 10 x 10 cm 2. Removal of left humerus plate and 6 screws 3. Application of left humerus external fixator  Implants: Synthes large external fixator  Surgeon: Eduard Roux, M.D.  Anesthesia: General  Estimated blood loss: 50 mL  Specimens: 2 cultures, one tissue culture, one frozen  Complications: None  Condition to PACU: Stable  Indications for procedure: Tanner Garza is a 69 year old gentleman who presents today for treatment of an infected nonunion of his left humerus.  He was aware of the risks, benefits, and alternatives to surgery and he wished to proceed.  Description of procedure: The patient was identified in the preoperative holding area. The operative site was marked by the surgeon confirmed with the patient. The patient was brought back to the operating room. He was placed supine on the table. Gen. anesthesia was administered. The left upper extremity was prepped and draped in standard sterile fashion. Preoperative antibiotics were given once the cultures were taken. A timeout was performed.  An incision was created over the previous scar. Blunt dissection was taken to the level of the brachialis muscle. The brachialis muscle was then carefully elevated off of the humerus in order to expose the plate and the humerus. This was done with a Cobb. Blunt dissection was carried out in order to expose the entire plate. There was a large amount of murky fluid that was expressed from the nonunion site and distally. The screws were backed out sequentially. There was no bite on the screws and they were loose. The plate was then taken out. The screws and the plates were all  intact. We then began our debridement portion of the procedure. We performed extensive sharp excisional debridement of the nonunion site the previous screw holes and the intramedullary canal using curet and rongeur.  Sharp excisional debridement of the surrounding soft tissue was also carried out. A frozen section was sent down to the pathology lab and revealed an abundant amount of polymorphonuclear cells consistent with infection.  Once we had appropriate debridement of the tissues. We thoroughly irrigated the wound with 6 L of normal saline using pulse lavage. We then turned our attention to placing the external fixator. Each pin of the external fixator was placed using fluoroscopic guidance. Blunt dissection was carried down to the bone. A tissue protector was then slid down onto the bone. The drill was then placed in the tissue protector and drilled into the proximal and distal portion of the humerus.  Each Schanz pin was placed bicortically. This was confirmed on fluoroscopy. We then placed the bars and clamps together and affected a reduction and this was held in place by tightening the clamps. Final x-rays were taken. 10 mL of antibiotics impregnated beads were placed in the bone and the soft tissue space. The wound was then thoroughly irrigated and closed in a layer fashion using 0 PDS, 2-0 Monocryl, 3-0 nylon. The pin sites were dressed with Xeroform and Kerlix. Sterile dressings were applied. The patient tolerated the procedure well was extubated and transferred to the PACU in stable condition.  Postoperative plan: We will await the cultures of the bone. He will remain on broad-spectrum antibiotics until speciation of his cultures. We will involve the infectious disease team for recommendations  on treatment duration.  In the PACU, the patient was neurovascularly intact.  We then requested a regional block for postop pain control.    Azucena Cecil, MD [place] 3:29  PM

## 2015-04-20 NOTE — Transfer of Care (Signed)
Immediate Anesthesia Transfer of Care Note  Patient: Tanner Garza  Procedure(s) Performed: Procedure(s): OPEN REDUCTION INTERNAL FIXATION (ORIF) LEFT HUMERAL SHAFT NONUNION ATTEMPTED, REMOVAL OF PLATE and SCREWS APPLICATION OF EXTERNAL FIXATOR LEFT HUMERUS PLACEMENT OF ANTIBIOTIC BEADS (Left)  Patient Location: PACU  Anesthesia Type:General  Level of Consciousness: awake, alert , oriented, pateint uncooperative and responds to stimulation  Airway & Oxygen Therapy: Patient Spontanous Breathing and Patient connected to face mask oxygen  Post-op Assessment: Report given to RN, Post -op Vital signs reviewed and stable, Patient moving all extremities X 4 and Patient able to stick tongue midline  Post vital signs: Reviewed and stable  Last Vitals:  Filed Vitals:   04/20/15 1043  BP: 138/98  Pulse: 84  Temp: 37.2 C  Resp: 20    Complications: No apparent anesthesia complications

## 2015-04-20 NOTE — Anesthesia Preprocedure Evaluation (Signed)
Anesthesia Evaluation  Patient identified by MRN, date of birth, ID band Patient awake    Reviewed: Allergy & Precautions, NPO status , Patient's Chart, lab work & pertinent test results  History of Anesthesia Complications (+) history of anesthetic complications  Airway Mallampati: II   Neck ROM: full    Dental   Pulmonary neg pulmonary ROS, sleep apnea , Current Smoker,    breath sounds clear to auscultation       Cardiovascular hypertension, Pt. on medications + dysrhythmias Supra Ventricular Tachycardia  Rhythm:regular Rate:Normal     Neuro/Psych PSYCHIATRIC DISORDERS Anxiety negative neurological ROS     GI/Hepatic negative GI ROS, Neg liver ROS, GERD  ,  Endo/Other  negative endocrine ROSobese  Renal/GU negative Renal ROS     Musculoskeletal negative musculoskeletal ROS (+)   Abdominal   Peds  Hematology negative hematology ROS (+)   Anesthesia Other Findings   Reproductive/Obstetrics                             Anesthesia Physical  Anesthesia Plan  ASA: II  Anesthesia Plan: General and Regional   Post-op Pain Management: MAC Combined w/ Regional for Post-op pain   Induction: Intravenous  Airway Management Planned: Oral ETT  Additional Equipment:   Intra-op Plan:   Post-operative Plan: Extubation in OR  Informed Consent: I have reviewed the patients History and Physical, chart, labs and discussed the procedure including the risks, benefits and alternatives for the proposed anesthesia with the patient or authorized representative who has indicated his/her understanding and acceptance.   Dental advisory given  Plan Discussed with: CRNA  Anesthesia Plan Comments:         Anesthesia Quick Evaluation

## 2015-04-20 NOTE — Anesthesia Procedure Notes (Signed)
Procedure Name: Intubation Date/Time: 04/20/2015 12:21 PM Performed by: Vennie Homans Pre-anesthesia Checklist: Patient identified, Timeout performed, Emergency Drugs available, Suction available and Patient being monitored Patient Re-evaluated:Patient Re-evaluated prior to inductionOxygen Delivery Method: Circle system utilized Preoxygenation: Pre-oxygenation with 100% oxygen Intubation Type: IV induction Ventilation: Mask ventilation without difficulty Laryngoscope Size: Mac and 4 Grade View: Grade II Tube type: Oral Tube size: 7.5 mm Number of attempts: 1 Airway Equipment and Method: Stylet Placement Confirmation: ETT inserted through vocal cords under direct vision,  positive ETCO2 and breath sounds checked- equal and bilateral Secured at: 23 cm Dental Injury: Teeth and Oropharynx as per pre-operative assessment

## 2015-04-20 NOTE — Anesthesia Postprocedure Evaluation (Signed)
  Anesthesia Post-op Note  Patient: Tanner Garza  Procedure(s) Performed: Procedure(s): OPEN REDUCTION INTERNAL FIXATION (ORIF) LEFT HUMERAL SHAFT NONUNION ATTEMPTED, REMOVAL OF PLATE and SCREWS APPLICATION OF EXTERNAL FIXATOR LEFT HUMERUS PLACEMENT OF ANTIBIOTIC BEADS (Left)  Patient Location: PACU  Anesthesia Type:General  Level of Consciousness: awake, alert , oriented, patient cooperative and responds to stimulation  Airway and Oxygen Therapy: Patient Spontanous Breathing and Patient connected to nasal cannula oxygen  Post-op Pain: mild  Post-op Assessment: Post-op Vital signs reviewed, Patient's Cardiovascular Status Stable, Respiratory Function Stable, Patent Airway, No signs of Nausea or vomiting and Pain level controlled              Post-op Vital Signs: Reviewed and stable  Last Vitals:  Filed Vitals:   04/20/15 1645  BP: 129/78  Pulse: 101  Temp: 36.5 C  Resp: 12    Complications: No apparent anesthesia complications

## 2015-04-20 NOTE — Progress Notes (Signed)
ANTIBIOTIC CONSULT NOTE - INITIAL  Pharmacy Consult for Vanco/Zosyn Indication: wound infection  Allergies  Allergen Reactions  . Atenolol     bradycardia  . Chantix [Varenicline]     Sedation   . Hctz [Hydrochlorothiazide]     Bradycardia   . Other     bandaid -rash    Patient Measurements: Height: 6' (182.9 cm) Weight: 271 lb 3.2 oz (123.016 kg) IBW/kg (Calculated) : 77.6 Adjusted Body Weight:    Vital Signs: Temp: 97.7 F (36.5 C) (10/04 1645) Temp Source: Oral (10/04 1043) BP: 129/78 mmHg (10/04 1645) Pulse Rate: 101 (10/04 1645) Intake/Output from previous day:   Intake/Output from this shift: Total I/O In: 2150 [I.V.:2150] Out: 150 [Blood:150]  Labs: No results for input(s): WBC, HGB, PLT, LABCREA, CREATININE in the last 72 hours. Estimated Creatinine Clearance: 103 mL/min (by C-G formula based on Cr of 0.93). No results for input(s): VANCOTROUGH, VANCOPEAK, VANCORANDOM, GENTTROUGH, GENTPEAK, GENTRANDOM, TOBRATROUGH, TOBRAPEAK, TOBRARND, AMIKACINPEAK, AMIKACINTROU, AMIKACIN in the last 72 hours.   Microbiology: No results found for this or any previous visit (from the past 720 hour(s)).  Medical History: Past Medical History  Diagnosis Date  . Hypertension   . BPH (benign prostatic hyperplasia)   . Anxiety   . Esophageal reflux   . Bell's palsy 09/2001  . Hyperlipidemia   . Eczema   . Colon polyp   . OSA (obstructive sleep apnea)   . SVT (supraventricular tachycardia) ([place])   . Complication of anesthesia     Slow to awaken  . Dysrhythmia     2 "bouts of tachycardia    Medications:  Prescriptions prior to admission  Medication Sig Dispense Refill Last Dose  . ALPRAZolam (XANAX) 0.5 MG tablet Take 0.5 mg by mouth every morning.    04/20/2015 at 0630  . amLODipine (NORVASC) 5 MG tablet Take 5 mg by mouth daily.   04/20/2015 at 0630  . aspirin 81 MG tablet Take 81 mg by mouth every evening.   Past Week at Unknown time  . dutasteride (AVODART) 0.5  MG capsule Take 0.5 mg by mouth daily.   04/19/2015 at Unknown time  . gabapentin (NEURONTIN) 300 MG capsule Take 300 mg by mouth at bedtime.    Past Week at Unknown time  . Lutein 6 MG TABS Take 6 mg by mouth daily.    Past Week at Unknown time  . meloxicam (MOBIC) 15 MG tablet Take 15 mg by mouth every evening.   Past Week at Unknown time  . Multiple Vitamin (MULTIVITAMIN) tablet Take 1 tablet by mouth daily.   Past Week at Unknown time  . OXYGEN-HELIUM IN Inhale into the lungs. CPAP   unknown  . pantoprazole (PROTONIX) 40 MG tablet Take 40 mg by mouth daily.    04/19/2015 at Unknown time  . spironolactone (ALDACTONE) 25 MG tablet Take 25 mg by mouth daily.   04/20/2015 at 0630   Assessment: 69 y.o. male who presents for surgical treatment of Left humerus nonunion>>ORIF 10/4.  Operative findings: Infected nonunion with minimal callus formation and murky fluid. No gross purulence. Procedure: 1. Irrigation and debridement of left humerus of the bone, muscle, subcutaneous tissue 10 x 10 cm 2. Removal of left humerus plate and 6 screws 3. Application of left humerus external fixator  ID: Wound infection of L humerus. Afebrile. WBC 6.4 on 9/30. Start antibiotics Vanco/Zosyn with Scr 0.93 and estimated CrCl>100. Received Vanco and Zosyn pre-op. Await cultures.  Vanco 10/4>> Zosyn 10/4>>  Plan:  Zosyn 3.375g IV q8hr Vanco 1500mg  x 1 (pre-op) then 1250mg  IV q8hr.  Goal of Therapy:  Vancomycin trough level 10-15 mcg/ml  Plan:  Zosyn 3.375g IV q8hr Vanco 1500mg  x 1 (pre-op) then 1250mg  IV q8hr. Vanco trough at steady state between 3-5 doses BMET in AM and q72h   Murry Diaz S. Alford Highland, PharmD, BCPS Clinical Staff Pharmacist Pager [phone]  Eilene Ghazi Stillinger 04/20/2015,5:40 PM

## 2015-04-21 ENCOUNTER — Encounter (HOSPITAL_COMMUNITY): Payer: Self-pay | Admitting: Orthopaedic Surgery

## 2015-04-21 DIAGNOSIS — S42302K Unspecified fracture of shaft of humerus, left arm, subsequent encounter for fracture with nonunion: Secondary | ICD-10-CM

## 2015-04-21 DIAGNOSIS — T84611D Infection and inflammatory reaction due to internal fixation device of left humerus, subsequent encounter: Secondary | ICD-10-CM

## 2015-04-21 DIAGNOSIS — S52502K Unspecified fracture of the lower end of left radius, subsequent encounter for closed fracture with nonunion: Secondary | ICD-10-CM

## 2015-04-21 LAB — BASIC METABOLIC PANEL
ANION GAP: 10 (ref 5–15)
BUN: 13 mg/dL (ref 6–20)
CO2: 25 mmol/L (ref 22–32)
Calcium: 9.1 mg/dL (ref 8.9–10.3)
Chloride: 99 mmol/L — ABNORMAL LOW (ref 101–111)
Creatinine, Ser: 0.97 mg/dL (ref 0.61–1.24)
GFR calc Af Amer: >60 mL/min (ref >60)
Glucose, Bld: 168 mg/dL — ABNORMAL HIGH (ref 65–99)
POTASSIUM: 4.2 mmol/L (ref 3.5–5.1)
SODIUM: 134 mmol/L — AB (ref 135–145)

## 2015-04-21 LAB — VANCOMYCIN, TROUGH: VANCOMYCIN TR: 24 ug/mL — AB (ref 10.0–20.0)

## 2015-04-21 MED ORDER — LOSARTAN POTASSIUM 50 MG PO TABS
100.0000 mg | ORAL_TABLET | Freq: Every day | ORAL | Status: DC
  Administered 2015-04-21 – 2015-04-22 (×2): 100 mg via ORAL
  Filled 2015-04-21 (×2): qty 2

## 2015-04-21 MED ORDER — VANCOMYCIN HCL 10 G IV SOLR
1250.0000 mg | Freq: Two times a day (BID) | INTRAVENOUS | Status: DC
  Administered 2015-04-22 – 2015-04-23 (×4): 1250 mg via INTRAVENOUS
  Filled 2015-04-21 (×4): qty 1250

## 2015-04-21 NOTE — Evaluation (Signed)
Occupational Therapy Evaluation Patient Details Name: Tanner Garza MRN: [id number] DOB: Mar 08, 1946 Today's Date: 04/21/2015    History of Present Illness OPEN REDUCTION INTERNAL FIXATION (ORIF) LEFT HUMERAL SHAFT NONUNION ATTEMPTED, REMOVAL OF PLATE and SCREWS APPLICATION OF EXTERNAL FIXATOR LEFT HUMERUS PLACEMENT OF ANTIBIOTIC BEADS (Left)   Clinical Impression   Pt reports he was independent with ADLs PTA. Pt refused OOB activities, wants to speak with MD prior to participating in therapy. Pt demonstrated ability to complete hand, wrist, elbow ROM on LUE. Provided pt with shoulder handout, asked him to read over it and we will review next session. Recommending 24 hour supervision upon d/c. Pt would benefit from continued OT in order to maximize independence and safety with UB ADLs, functional mobility, and independence with exercises.     Follow Up Recommendations  Supervision/Assistance - 24 hour;Other (comment) (TBD )    Equipment Recommendations  Other (comment) (TBD)    Recommendations for Other Services PT consult     Precautions / Restrictions Precautions Precautions: Shoulder Type of Shoulder Precautions: No ROM at shoulder. Elbow/wrist/hand AROM ok. Sling on at all times except for bathing, dressing and exercises Shoulder Interventions: Shoulder sling/immobilizer;At all times;Off for dressing/bathing/exercises Precaution Booklet Issued: Yes (comment) Required Braces or Orthoses: Sling Restrictions Weight Bearing Restrictions: Yes LUE Weight Bearing: Non weight bearing      Mobility Bed Mobility               General bed mobility comments: Pt declined bed mobility at this time.  Transfers                 General transfer comment: Pt declined OOB activities at this time.    Balance                                            ADL Overall ADL's : Needs assistance/impaired Eating/Feeding: Set up;Sitting   Grooming: Set  up;Sitting   Upper Body Bathing: Sitting;Moderate assistance   Lower Body Bathing: Minimal assistance;Sit to/from stand   Upper Body Dressing : Sitting;Moderate assistance   Lower Body Dressing: Minimal assistance;Sit to/from stand                 General ADL Comments: Pt declined OOB activities, wants to talk to MD before proceeding with therapy. Pt frustrated and was no planning to have an external fixator, wants to speak with MD regarding what he can/cannot do. Provided pt with shoulder handout, asked him to read over it and will return this PM to educate and review handout.       Vision     Perception     Praxis      Pertinent Vitals/Pain Pain Assessment: 0-10 Pain Score: 2  (while laying in bed ) Pain Location: L upper arm Pain Intervention(s): Limited activity within patient's tolerance;Monitored during session     Hand Dominance Right   Extremity/Trunk Assessment Upper Extremity Assessment Upper Extremity Assessment: LUE deficits/detail LUE Deficits / Details: Hand, wrist, elbow AROM WFL LUE: Unable to fully assess due to immobilization   Lower Extremity Assessment Lower Extremity Assessment: Defer to PT evaluation       Communication Communication Communication: No difficulties   Cognition Arousal/Alertness: Awake/alert Behavior During Therapy: WFL for tasks assessed/performed Overall Cognitive Status: Within Functional Limits for tasks assessed  General Comments       Exercises Exercises: Shoulder     Shoulder Instructions      Home Living Family/patient expects to be discharged to:: Private residence Living Arrangements: Spouse/significant other;Children Available Help at Discharge: Family;Available PRN/intermittently Type of Home: House Home Access: Stairs to enter [employer] of Steps: 1 Entrance Stairs-Rails: None Home Layout: Laundry or work area in basement     [employer] Shower/Tub:  Teacher, 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     Home Equipment: None          Prior Functioning/Environment Level of Independence: Independent             OT Diagnosis: Acute pain;Generalized weakness   OT Problem List: Decreased safety awareness;Decreased knowledge of use of DME or AE;Decreased knowledge of precautions;Pain;Decreased activity tolerance   OT Treatment/Interventions: Self-care/ADL training;Therapeutic exercise;DME and/or AE instruction;Patient/family education    OT Goals(Current goals can be found in the care plan section) Acute Rehab OT Goals Patient Stated Goal: talk to the doctor  OT Goal Formulation: With patient Time For Goal Achievement: 05/05/15 Potential to Achieve Goals: Good ADL Goals Pt Will Perform Upper Body Bathing: with min assist;sitting Pt Will Perform Upper Body Dressing: with min assist;sitting Pt Will Transfer to Toilet: with supervision;ambulating (possibly with BSC over toilet) Pt Will Perform Toileting - Clothing Manipulation and hygiene: with supervision;sit to/from stand Pt/caregiver will Perform Home Exercise Program: Increased ROM;Left upper extremity;Independently (elbow, wrist, hand)  OT Frequency: Min 3X/week   Barriers to D/C: Decreased caregiver support  Pt reports wife works and is not home during the day       Co-evaluation              End of Session Equipment Utilized During Treatment: Other (comment) (sling) Nurse Communication: Precautions;Weight bearing status  Activity Tolerance: Patient tolerated treatment well Patient left: in bed;with call bell/phone within reach   Time: 8299-3716 OT Time Calculation (min): 17 min Charges:  OT General Charges $OT Visit: 1 Procedure OT Evaluation $Initial OT Evaluation Tier I: 1 Procedure G-Codes:     Binnie Kand M.S., OTR/L Pager: [phone]  04/21/2015, 11:07 AM

## 2015-04-21 NOTE — Consult Note (Signed)
[facility]  Total days of antibiotics 2        Zosyn 10/4>        Vancomycin 10/4>       Reason for Consult: Infected Left Humerus Nonunion     Referring Physician: Dr. Erlinda Hong (orthopedics)  Active Problems:   Humerus fracture    HPI: Tanner Garza is a 69yo man with PMHx of HTN, OSA, hyperlipidemia, and recent surgery for a left humeral shaft fracture who was admitted on 10/4 for irrigation and debridement of his left humerus due to concern for infection. Patient initially had surgery on 5/25 after suffering a closed transverse humeral fracture, distal radius fracture, and left index finger DIP dislocation from falling off a ladder. Orthopedic surgery performed an ORIF of his left humeral shaft fracture on 5/25 that involved placement of a compression plate and 6 screws as well as an ORIF of the distal radius with a plate. On 10/4 patient underwent surgery again to have I&D of the left humerus with removal of the plate and screws. During the operation, a large amount of "murky fluid" was noted at the nonunion site. Tissue and wound cultures were taken that all have no growth to date. He was empirically started on Vanc and Zosyn.   Patient has remained afebrile since admission. He notes about 1 month after his initial surgery on 5/25 he noticed redness and swelling of his left upper extremity. He followed up with orthopedics and was told it was "a rash." He states the erythema and swelling continued to worsen and he followed up again about 1 month ago and was told again by a different provider that it was "just a rash." He states his left arm became swollen down to his wrist and he had difficulty moving his arm above 90 degrees. He also noticed his upper arm became hard and painful and he had a low grade fever of 100 that worried him enough to return to the clinic again. An x-ray was done showing the nonunion in his left humerus and there was concern for infection. A CBC was  performed which did not show a leukocytosis. He denies receiving any antibiotics during the past 4 months. He denies any fevers or chills other than the one day with low grade fever.   Past Medical History  Diagnosis Date  . Hypertension   . BPH (benign prostatic hyperplasia)   . Anxiety   . Esophageal reflux   . Bell's palsy 09/2001  . Hyperlipidemia   . Eczema   . Colon polyp   . OSA (obstructive sleep apnea)   . SVT (supraventricular tachycardia) ([place])   . Complication of anesthesia     Slow to awaken  . Dysrhythmia     2 "bouts of tachycardia    Allergies:  Allergies  Allergen Reactions  . Atenolol     bradycardia  . Chantix [Varenicline]     Sedation   . Hctz [Hydrochlorothiazide]     Bradycardia   . Other     bandaid -rash    Current antibiotics:   MEDICATIONS: . ALPRAZolam  0.5 mg Oral q morning - 10a  . amLODipine  5 mg Oral Daily  . aspirin EC  81 mg Oral q1800  . dutasteride  0.5 mg Oral Daily  . gabapentin  300 mg Oral QHS  . multivitamin with minerals  1 tablet Oral Daily  . pantoprazole  40 mg Oral Daily  . piperacillin-tazobactam (ZOSYN)  IV  3.375 g Intravenous 3 times per day  . spironolactone  25 mg Oral Daily  . vancomycin  1,250 mg Intravenous Q8H    Social History  Substance Use Topics  . Smoking status: Current Every Day Smoker -- 0.50 packs/day for 45 years    Types: Cigarettes  . Smokeless tobacco: None  . Alcohol Use: No    History reviewed. No pertinent family history.  Review of Systems -  General: Denies night sweats, changes in weight, changes in appetite HEENT: Denies headaches, ear pain, changes in vision, rhinorrhea, sore throat CV: Denies CP, palpitations, SOB, orthopnea Pulm: Denies SOB, cough, wheezing GI: Denies abdominal pain, nausea, vomiting, diarrhea, constipation, melena, hematochezia GU: Denies dysuria, hematuria, frequency Msk: Denies muscle cramps, joint pains Neuro: Denies weakness, numbness,  tingling Skin: Denies bruising Psych: Denies depression, anxiety, hallucinations   OBJECTIVE: Temp:  [97.7 F (36.5 C)-98.6 F (37 C)] 98.4 F (36.9 C) (10/05 1245) Pulse Rate:  [64-110] 83 (10/05 1245) Resp:  [7-18] 18 (10/05 1245) BP: (81-160)/(47-96) 160/95 mmHg (10/05 1245) SpO2:  [90 %-98 %] 98 % (10/05 1245) General: alert, sitting up in chair, NAD HEENT: [place]/AT, EOMI, sclera anicteric, mucus membranes moist CV: RRR, no m/g/r Pulm: CTA bilaterally, breaths non-labored Abd: BS+, soft, obese, non-tender Ext: Left upper extremity in sling and wrapped in an ace bandage. External fixator in place. Under the ace bandage the surgical incision appears clean/dry/intact and healing well. No peripheral edema in lower extremities.  Neuro: alert and oriented x 3  LABS: Results for orders placed or performed during the hospital encounter of 04/20/15 (from the past 48 hour(s))  Anaerobic culture     Status: None (Preliminary result)   Collection Time: 04/20/15 12:00 PM  Result Value Ref Range   Specimen Description TISSUE LEFT ARM    Special Requests NONUNION HUMERUS    Gram Stain      NO WBC SEEN NO ORGANISMS SEEN Performed at [facility]    Culture PENDING    Report Status PENDING   Tissue culture     Status: None (Preliminary result)   Collection Time: 04/20/15 12:00 PM  Result Value Ref Range   Specimen Description TISSUE    Special Requests NON UNION HUMERUS    Gram Stain      NO WBC SEEN NO ORGANISMS SEEN Performed at [facility]    Culture NO GROWTH Performed at [facility]     Report Status PENDING   Anaerobic culture     Status: None (Preliminary result)   Collection Time: 04/20/15  1:12 PM  Result Value Ref Range   Specimen Description WOUND ARM LEFT    Special Requests SPEC A LEFT HUMERAL SHAFT NONUNION    Gram Stain      FEW WBC PRESENT,BOTH PMN AND MONONUCLEAR NO SQUAMOUS EPITHELIAL CELLS SEEN NO ORGANISMS SEEN Performed at  [facility]    Culture PENDING    Report Status PENDING   Wound culture     Status: None (Preliminary result)   Collection Time: 04/20/15  1:12 PM  Result Value Ref Range   Specimen Description WOUND ARM LEFT    Special Requests SPEC A LEFT HUMERAL SHAFT NONUNION    Gram Stain      FEW WBC PRESENT,BOTH PMN AND MONONUCLEAR NO SQUAMOUS EPITHELIAL CELLS SEEN NO ORGANISMS SEEN Performed at [facility]    Culture NO GROWTH Performed at [facility]     Report Status PENDING  Anaerobic culture     Status: None (Preliminary result)   Collection Time: 04/20/15  1:13 PM  Result Value Ref Range   Specimen Description WOUND ARM LEFT    Special Requests SPEC B LEFT HUMEROUS NON UNION FRACTURE SITE    Gram Stain      FEW WBC PRESENT,BOTH PMN AND MONONUCLEAR NO SQUAMOUS EPITHELIAL CELLS SEEN NO ORGANISMS SEEN Performed at [facility]    Culture PENDING    Report Status PENDING   Wound culture     Status: None (Preliminary result)   Collection Time: 04/20/15  1:13 PM  Result Value Ref Range   Specimen Description WOUND ARM LEFT    Special Requests SPEC B LEFT HUMEROUS NON UNION FRACTURE SITE    Gram Stain      FEW WBC PRESENT,BOTH PMN AND MONONUCLEAR NO SQUAMOUS EPITHELIAL CELLS SEEN NO ORGANISMS SEEN Performed at [facility]    Culture NO GROWTH Performed at [facility]     Report Status PENDING   Basic metabolic panel     Status: Abnormal   Collection Time: 04/21/15  4:30 AM  Result Value Ref Range   Sodium 134 (L) 135 - 145 mmol/L   Potassium 4.2 3.5 - 5.1 mmol/L   Chloride 99 (L) 101 - 111 mmol/L   CO2 25 22 - 32 mmol/L   Glucose, Bld 168 (H) 65 - 99 mg/dL   BUN 13 6 - 20 mg/dL   Creatinine, Ser 0.97 0.61 - 1.24 mg/dL   Calcium 9.1 8.9 - 10.3 mg/dL   GFR calc non Af Amer >60 >60 mL/min   GFR calc Af Amer >60 >60 mL/min    Comment: (NOTE) The eGFR has been calculated using the CKD EPI equation. This calculation  has not been validated in all clinical situations. eGFR's persistently <60 mL/min signify possible Chronic Kidney Disease.    Anion gap 10 5 - 15    MICRO:  IMAGING: Dg Humerus Left  04/20/2015   CLINICAL DATA:  Surgery: (ORIF) LEFT HUMERAL SHAFT NONUNION ATTEMPTED, REMOVAL OF PLATE and SCREWS APPLICATION OF EXTERNAL FIXATOR LEFT HUMERUS PLACEMENT OF ANTIBIOTIC BEADS  EXAM: DG C-ARM GT 120 MIN; LEFT HUMERUS - 2+ VIEW  FLUOROSCOPY TIME:  Fluoroscopy Time (in minutes and seconds): 1 minutes and 5 seconds  Number of Acquired Images:  7  COMPARISON:  12/09/2014  FINDINGS: Provide images show removal of the orthopedic hardware followed by placement of an external fixator crossing the ununited fracture of the midshaft of the humerus. Primary fracture components appear well aligned following placement of the external fixator. Antibiotic impregnated beads are seen surrounding the fracture line.  IMPRESSION: Well aligned left humerus fracture following placement of an external fixator after removal of the previous fixation plate and screws.   Electronically Signed   By: Lajean Manes M.D.   On: 04/20/2015 15:59   Dg C-arm Gt 120 Min  04/20/2015   CLINICAL DATA:  Surgery: (ORIF) LEFT HUMERAL SHAFT NONUNION ATTEMPTED, REMOVAL OF PLATE and SCREWS APPLICATION OF EXTERNAL FIXATOR LEFT HUMERUS PLACEMENT OF ANTIBIOTIC BEADS  EXAM: DG C-ARM GT 120 MIN; LEFT HUMERUS - 2+ VIEW  FLUOROSCOPY TIME:  Fluoroscopy Time (in minutes and seconds): 1 minutes and 5 seconds  Number of Acquired Images:  7  COMPARISON:  12/09/2014  FINDINGS: Provide images show removal of the orthopedic hardware followed by placement of an external fixator crossing the ununited fracture of the midshaft of the humerus. Primary fracture components appear well aligned  following placement of the external fixator. Antibiotic impregnated beads are seen surrounding the fracture line.  IMPRESSION: Well aligned left humerus fracture following placement of an  external fixator after removal of the previous fixation plate and screws.   Electronically Signed   By: Lajean Manes M.D.   On: 04/20/2015 15:59    HISTORICAL MICRO/IMAGING  Assessment/Plan:  Tanner Garza is a 69yo man who underwent recent surgery for a left humeral shaft fracture and distal radial fracture who was admitted on 10/4 for irrigation and debridement of his left humerus due to concern for infection.   Infected Left Humerus Nonunion: s/p I&D and humeral plate/screw removal on 10/4 with murky fluid noted. All cultures negative so far. Patient has remained afebrile. Will await next 24-36 hours to see if cultures grow a specific organism. Will keep Vanc/Zosyn on for next 24 hours and then likely switch to Vanc/Cefriaxone as MSSA most likely organism. If a coagulase negative Staph does grow then may add rifampin as biofilms concerning with hardware infections. With orthopedic hardware infections also worry about Propionibacterium and biofilms. Will likely need IV antibiotic therapy for 4-6 weeks.  - Continue Vanc/Zosyn for now - Likely switch to Vanc/Ceftriaxone tomorrow - f/u cultures - Check CBC w/ diff, bmet, ESR, CRP - I will ask micro lab to hold plates for at least 10 days as Propionibacterium can take longer to grow    Thank you for this interesting consult. Attending note to follow.  Albin Felling, MD, MPH Internal Medicine Resident, PGY-II Pager: [phone]

## 2015-04-21 NOTE — Progress Notes (Signed)
Patient placed himself on CPAP for the night  

## 2015-04-21 NOTE — Progress Notes (Signed)
Occupational Therapy Treatment Patient Details Name: Tanner Garza MRN: [id number] DOB: [birth year] Today's Date: 04/21/2015    History of present illness OPEN REDUCTION INTERNAL FIXATION (ORIF) LEFT HUMERAL SHAFT NONUNION ATTEMPTED, REMOVAL OF PLATE and SCREWS APPLICATION OF EXTERNAL FIXATOR LEFT HUMERUS PLACEMENT OF ANTIBIOTIC BEADS (Left)   OT comments  Pt progressing well toward OT goals. Educated pt on UB dressing/bathing technique, sling management and wear schedule, L UE positioning; pt verbalized understanding. Completed LUE ROM exercises; wrist and hand WFL, limited elbow ROM due to dressing at crease of elbow. Pt demonstrated toilet transfer without using grab bars and grooming while standing at the sink with supervision for safety. Pt is overall supervision for functional mobility. Recommending intermittent supervision during ADLs and functional mobility at this time. Continue to follow pt acutely.    Follow Up Recommendations  Supervision - Intermittent (Follow up per MD)    Equipment Recommendations  None recommended by OT    Recommendations for Other Services PT consult    Precautions / Restrictions Precautions Precautions: Shoulder Type of Shoulder Precautions: No ROM at shoulder. Elbow/wrist/hand AROM ok. Sling on at all times except for bathing, dressing and exercises Shoulder Interventions: Shoulder sling/immobilizer;At all times;Off for dressing/bathing/exercises Precaution Booklet Issued: Yes (comment) Precaution Comments: Reviewed precautions Required Braces or Orthoses: Sling Restrictions Weight Bearing Restrictions: Yes LUE Weight Bearing: Non weight bearing       Mobility Bed Mobility               General bed mobility comments: Pt in recliner, returned to recliner at end of session  Transfers Overall transfer level: Needs assistance   Transfers: Sit to/from Stand Sit to Stand: Supervision         General transfer comment: Sit <> stand  from chair x 2, toilet x 2    Balance Overall balance assessment: No apparent balance deficits (not formally assessed)                                 ADL Overall ADL's : Needs assistance/impaired Eating/Feeding: Set up;Sitting   Grooming: Oral care;Supervision/safety;Standing   Upper Body Bathing: Sitting;Moderate assistance   Lower Body Bathing: Minimal assistance;Sit to/from stand   Upper Body Dressing : Sitting;Moderate assistance   Lower Body Dressing: Supervision/safety Lower Body Dressing Details (indicate cue type and reason): Pt able to don/doff bilateral socks Toilet Transfer: Supervision/safety;Ambulation;Comfort height toilet Toilet Transfer Details (indicate cue type and reason): Demonstrated sit <> stand without grab bars to simulate home environment         Functional mobility during ADLs: Supervision/safety General ADL Comments: Educated and demonstrated UB bathing/dressing technique, sling management and wear schedule, precautions; pt verbalized understanding.       Vision                     Perception     Praxis      Cognition   Behavior During Therapy: WFL for tasks assessed/performed Overall Cognitive Status: Within Functional Limits for tasks assessed                       Extremity/Trunk Assessment  Upper Extremity Assessment Upper Extremity Assessment: LUE deficits/detail LUE Deficits / Details: Hand, wrist, elbow AROM WFL LUE: Unable to fully assess due to immobilization   Lower Extremity Assessment Lower Extremity Assessment: Defer to PT evaluation        Exercises Shoulder Exercises Elbow  Flexion: AROM;Left;Seated;10 reps (limited ROM due to dressing at elbow) Wrist Flexion: AROM;Left;10 reps;Seated Digit Composite Flexion: AROM;Left;10 reps;Seated Donning/doffing shirt without moving shoulder: Minimal assistance;Patient able to independently direct caregiver Method for sponge bathing under operated  UE: Minimal assistance;Patient able to independently direct caregiver Donning/doffing sling/immobilizer: Minimal assistance;Patient able to independently direct caregiver Correct positioning of sling/immobilizer: Minimal assistance;Patient able to independently direct caregiver ROM for elbow, wrist and digits of operated UE: Supervision/safety Sling wearing schedule (on at all times/off for ADL's): Supervision/safety Proper positioning of operated UE when showering: Minimal assistance;Patient able to independently direct caregiver Positioning of UE while sleeping: Minimal assistance;Patient able to independently direct caregiver   Shoulder Instructions Shoulder Instructions Donning/doffing shirt without moving shoulder: Minimal assistance;Patient able to independently direct caregiver Method for sponge bathing under operated UE: Minimal assistance;Patient able to independently direct caregiver Donning/doffing sling/immobilizer: Minimal assistance;Patient able to independently direct caregiver Correct positioning of sling/immobilizer: Minimal assistance;Patient able to independently direct caregiver ROM for elbow, wrist and digits of operated UE: Supervision/safety Sling wearing schedule (on at all times/off for ADL's): Supervision/safety Proper positioning of operated UE when showering: Minimal assistance;Patient able to independently direct caregiver Positioning of UE while sleeping: Minimal assistance;Patient able to independently direct caregiver     General Comments      Pertinent Vitals/ Pain       Pain Assessment: Faces Pain Score: 2  (while laying in bed ) Faces Pain Scale: Hurts even more Pain Location: L shoulder Pain Descriptors / Indicators: Grimacing;Sharp Pain Intervention(s): Limited activity within patient's tolerance;Monitored during session;Repositioned;Ice applied  Home Living Family/patient expects to be discharged to:: Private residence Living Arrangements:  Spouse/significant other;Children Available Help at Discharge: Family;Available PRN/intermittently Type of Home: House Home Access: Stairs to enter [employer] of Steps: 1 Entrance Stairs-Rails: None Home Layout: Laundry or work area in basement     [employer] Shower/Tub: Teacher, 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     Home Equipment: None          Prior Functioning/Environment Level of Independence: Independent            Frequency Min 3X/week     Progress Toward Goals  OT Goals(current goals can now be found in the care plan section)  Progress towards OT goals: Progressing toward goals  Acute Rehab OT Goals Patient Stated Goal: to go home OT Goal Formulation: With patient Time For Goal Achievement: 05/05/15 Potential to Achieve Goals: Good ADL Goals Pt Will Perform Upper Body Bathing: with min assist;sitting Pt Will Perform Upper Body Dressing: with min assist;sitting Pt Will Transfer to Toilet: with supervision;ambulating (possibly with BSC over toilet) Pt Will Perform Toileting - Clothing Manipulation and hygiene: with supervision;sit to/from stand Pt/caregiver will Perform Home Exercise Program: Increased ROM;Left upper extremity;Independently (elbow, wrist, hand)  Plan Discharge plan remains appropriate    Co-evaluation                 End of Session Equipment Utilized During Treatment: Other (comment) (sling)   Activity Tolerance Patient tolerated treatment well   Patient Left in chair;with call bell/phone within reach   Nurse Communication Mobility status        Time: 1410-1439 OT Time Calculation (min): 29 min  Charges: OT General Charges $OT Visit: 1 Procedure OT Evaluation $Initial OT Evaluation Tier I: 1 Procedure OT Treatments $Self Care/Home Management : 8-22 mins $Therapeutic Exercise: 8-22 mins  Binnie Kand M.S., OTR/L Pager: [phone]  04/21/2015, 2:49 PM

## 2015-04-21 NOTE — Progress Notes (Signed)
Per OT pt is at supervision level- no CSW needs identified at this time  CSW signing off  Domenica Reamer, [place] Social Worker [phone]

## 2015-04-21 NOTE — Progress Notes (Signed)
OT Cancellation Note  Patient Details Name: Tanner Garza MRN: [id number] DOB: 07-05-1946   Cancelled Treatment:    Reason Eval/Treat Not Completed: Patient declined, no reason specified. Pt declining to work with therapy at this time. He would like to speak with MD prior to participating in therapy. Will check back as time allows.   Binnie Kand M.S., OTR/L Pager: [phone]  04/21/2015, 11:11 AM

## 2015-04-21 NOTE — Progress Notes (Signed)
   Subjective:  Patient reports pain as mild.  Objective:   VITALS:   Filed Vitals:   04/20/15 2106 04/21/15 0142 04/21/15 0444 04/21/15 1245  BP: 146/96 128/85 131/91 160/95  Pulse: 110 102 91 83  Temp: 97.8 F (36.6 C) 98.3 F (36.8 C) 98.6 F (37 C) 98.4 F (36.9 C)  TempSrc: Oral Oral Oral Oral  Resp: 16 16 16 18   Height:      Weight:      SpO2: 96% 94% 95% 98%    Neurologically intact Neurovascular intact Sensation intact distally Intact pulses distally Dorsiflexion/Plantar flexion intact Incision: dressing C/D/I and no drainage No cellulitis present Compartment soft radial nerve intact   Lab Results  Component Value Date   WBC 6.4 04/16/2015   HGB 14.4 04/16/2015   HCT 42.1 04/16/2015   MCV 93.1 04/16/2015   PLT 212 04/16/2015     Assessment/Plan:  1 Day Post-Op   - Expected postop acute blood loss anemia - will monitor for symptoms - Up with PT/OT - DVT ppx - SCDs, ambulation - NWB operative extremity, may do elbow, wrist, hand, finger ROM - Pain control - ID to see today - cx pending - vanc and zosyn empirically   Marianna Payment 04/21/2015, 1:12 PM [phone]

## 2015-04-21 NOTE — Anesthesia Postprocedure Evaluation (Deleted)
Anesthesia Post Note  Patient: Tanner Garza  Procedure(s) Performed: Procedure(s) (LRB): OPEN REDUCTION INTERNAL FIXATION (ORIF) LEFT HUMERAL SHAFT NONUNION ATTEMPTED, REMOVAL OF PLATE and SCREWS APPLICATION OF EXTERNAL FIXATOR LEFT HUMERUS PLACEMENT OF ANTIBIOTIC BEADS (Left)  Anesthesia type: General  Patient location: PACU  Post pain: Pain level controlled  Post assessment: Post-op Vital signs reviewed  Last Vitals: BP 160/95 mmHg  Pulse 83  Temp(Src) 36.9 C (Oral)  Resp 18  Ht 6' (1.829 m)  Wt 271 lb 3.2 oz (123.016 kg)  BMI 36.77 kg/m2  SpO2 98%  Post vital signs: Reviewed  Level of consciousness: sedated  Complications: No apparent anesthesia complications

## 2015-04-21 NOTE — Progress Notes (Signed)
PT Cancellation Note  Patient Details Name: Tanner Garza MRN: [id number] DOB: 1945/09/04   Cancelled Treatment:    Reason Eval/Treat Not Completed: PT screened, no needs identified, will sign off.  Per OT pt is at supervision level w/ all mobility.  Thank you for this order.  Joslyn Hy PT, DPT [phone] Pager: [phone] 04/21/2015, 3:00 PM

## 2015-04-21 NOTE — Progress Notes (Signed)
PT Cancellation Note  Patient Details Name: Tanner Garza MRN: [id number] DOB: 1945/08/09   Cancelled Treatment:    Reason Eval/Treat Not Completed: Patient declined, no reason specified.  Per RN pt is politely refusing therapy until he speaks w/ his MD about working w/ therapy.  Will complete Evaluation once pt is agreeable and as time permits.  Thank you for this order.  Joslyn Hy PT, DPT [phone] Pager: [phone] 04/21/2015, 10:40 AM

## 2015-04-21 NOTE — Progress Notes (Signed)
ANTIBIOTIC CONSULT NOTE - FOLLOW UP  Pharmacy Consult for Vancomycin Indication: wound infection  Allergies  Allergen Reactions  . Atenolol     bradycardia  . Chantix [Varenicline]     Sedation   . Hctz [Hydrochlorothiazide]     Bradycardia   . Other     bandaid -rash    Patient Measurements: Height: 6' (182.9 cm) Weight: 271 lb 3.2 oz (123.016 kg) IBW/kg (Calculated) : 77.6 Adjusted Body Weight:  Vital Signs: Temp: 98.4 F (36.9 C) (10/05 1245) Temp Source: Oral (10/05 1245) BP: 160/95 mmHg (10/05 1245) Pulse Rate: 83 (10/05 1245) Intake/Output from previous day: 10/04 0701 - 10/05 0700 In: 2750 [I.V.:2150; IV Piggyback:600] Out: 1500 [Urine:1350; Blood:150] Intake/Output from this shift: Total I/O In: 240 [P.O.:240] Out: -   Labs:  Recent Labs  04/21/15 0430  CREATININE 0.97   Estimated Creatinine Clearance: 98.8 mL/min (by C-G formula based on Cr of 0.97).  Recent Labs  04/21/15 2024  VANCOTROUGH 24*      Assessment: 69 y.o. male who presents for surgical treatment of Left humerus nonunion>>ORIF 10/4.  Operative findings: Infected nonunion with minimal callus formation and murky fluid. No gross purulence.   Procedure 10/4:  1. Irrigation and debridement of left humerus of the bone, muscle, subcutaneous tissue 10 x 10 cm  2. Removal of left humerus plate and 6 screws  3. Application of left humerus external fixator    ID: Wound infection of L humerus. Afebrile. WBC 6.4 on 9/30. Start antibiotics Vanco/Zosyn with Scr 0.97 and estimated CrCl>100. Received Vanco and Zosyn pre-op. Await cultures. Vanco trough prior to the fourth dose 24 > goal.  Vanco 10/4>>  Zosyn 10/4>>   10/4 L arm wound x2>>pending   Goal of Therapy:  Vancomycin trough level 10-15 mcg/ml  Plan:  Change Vancomycin to 1250mg  IV q12h hrs.  Chasey Dull S. Alford Highland, PharmD, BCPS Clinical Staff Pharmacist Pager [phone]  Eilene Ghazi Stillinger 04/21/2015,9:41  PM

## 2015-04-21 NOTE — Progress Notes (Signed)
Utilization review completed.  

## 2015-04-22 DIAGNOSIS — Z9889 Other specified postprocedural states: Secondary | ICD-10-CM

## 2015-04-22 DIAGNOSIS — B957 Other staphylococcus as the cause of diseases classified elsewhere: Secondary | ICD-10-CM

## 2015-04-22 DIAGNOSIS — M86122 Other acute osteomyelitis, left humerus: Secondary | ICD-10-CM

## 2015-04-22 LAB — CBC WITH DIFFERENTIAL/PLATELET
BASOS ABS: 0.1 10*3/uL (ref 0.0–0.1)
BASOS PCT: 1 %
EOS ABS: 0.1 10*3/uL (ref 0.0–0.7)
EOS PCT: 1 %
HCT: 40.1 % (ref 39.0–52.0)
Hemoglobin: 13.6 g/dL (ref 13.0–17.0)
LYMPHS ABS: 1 10*3/uL (ref 0.7–4.0)
Lymphocytes Relative: 11 %
MCH: 32 pg (ref 26.0–34.0)
MCHC: 33.9 g/dL (ref 30.0–36.0)
MCV: 94.4 fL (ref 78.0–100.0)
Monocytes Absolute: 0.6 10*3/uL (ref 0.1–1.0)
Monocytes Relative: 6 %
Neutro Abs: 7.2 10*3/uL (ref 1.7–7.7)
Neutrophils Relative %: 81 %
PLATELETS: 206 10*3/uL (ref 150–400)
RBC: 4.25 MIL/uL (ref 4.22–5.81)
RDW: 13.9 % (ref 11.5–15.5)
WBC: 8.8 10*3/uL (ref 4.0–10.5)

## 2015-04-22 LAB — BASIC METABOLIC PANEL
Anion gap: 7 (ref 5–15)
BUN: 15 mg/dL (ref 6–20)
CALCIUM: 9 mg/dL (ref 8.9–10.3)
CO2: 26 mmol/L (ref 22–32)
CREATININE: 0.85 mg/dL (ref 0.61–1.24)
Chloride: 104 mmol/L (ref 101–111)
GFR calc Af Amer: >60 mL/min (ref >60)
Glucose, Bld: 128 mg/dL — ABNORMAL HIGH (ref 65–99)
Potassium: 3.8 mmol/L (ref 3.5–5.1)
SODIUM: 137 mmol/L (ref 135–145)

## 2015-04-22 LAB — C-REACTIVE PROTEIN: CRP: 1 mg/dL — ABNORMAL HIGH (ref <1.0)

## 2015-04-22 LAB — SEDIMENTATION RATE: SED RATE: 55 mm/h — AB (ref 0–16)

## 2015-04-22 LAB — HIV ANTIBODY (ROUTINE TESTING W REFLEX): HIV Screen 4th Generation wRfx: NONREACTIVE

## 2015-04-22 MED ORDER — RIFAMPIN 300 MG PO CAPS
300.0000 mg | ORAL_CAPSULE | Freq: Two times a day (BID) | ORAL | Status: DC
  Administered 2015-04-22 – 2015-04-23 (×3): 300 mg via ORAL
  Filled 2015-04-22 (×3): qty 1

## 2015-04-22 NOTE — Progress Notes (Signed)
Occupational Therapy Treatment Patient Details Name: Tanner Garza MRN: [id number] DOB: 1946/05/25 Today's Date: 04/22/2015    History of present illness OPEN REDUCTION INTERNAL FIXATION (ORIF) LEFT HUMERAL SHAFT NONUNION ATTEMPTED, REMOVAL OF PLATE and SCREWS APPLICATION OF EXTERNAL FIXATOR LEFT HUMERUS PLACEMENT OF ANTIBIOTIC BEADS (Left)   OT comments  Pt making good progress toward OT goals. Pt maintains supervision status for ADLs and functional mobility. Practiced L elbow, wrist, hand exercises x 10 each, pt with limited ROM at elbow due to pain and dressing at elbow. Provided pt with squeeze ball to maximize finger ROM. Pt plan to d/c home with intermittent supervision from family. Continue to follow pt acutely.    Follow Up Recommendations  Supervision - Intermittent (Follow up per MD)    Equipment Recommendations  None recommended by OT    Recommendations for Other Services      Precautions / Restrictions Precautions Precautions: Shoulder Type of Shoulder Precautions: No ROM at shoulder. Elbow/wrist/hand AROM ok. Sling on at all times except for bathing, dressing and exercises Shoulder Interventions: Shoulder sling/immobilizer;At all times;Off for dressing/bathing/exercises Precaution Comments: Reviewed precautions Required Braces or Orthoses: Sling Restrictions Weight Bearing Restrictions: Yes LUE Weight Bearing: Non weight bearing       Mobility Bed Mobility               General bed mobility comments: Pt in recliner, returned to recliner at end of session.  Transfers Overall transfer level: Needs assistance Equipment used: None Transfers: Sit to/from Stand Sit to Stand: Supervision              Balance Overall balance assessment: No apparent balance deficits (not formally assessed)                                 ADL Overall ADL's : Needs assistance/impaired     Grooming: Wash/dry hands;Supervision/safety;Standing                    Toilet Transfer: Supervision/safety;Ambulation;Comfort height toilet       Tub/ Shower Transfer: Tub transfer;Supervision/safety;Ambulation   Functional mobility during ADLs: Supervision/safety General ADL Comments: Pt demonstrated sling management. Reviewed precautions; pt verbalized understanding.      Vision                     Perception     Praxis      Cognition   Behavior During Therapy: [facility] for tasks assessed/performed Overall Cognitive Status: Within Functional Limits for tasks assessed                       Extremity/Trunk Assessment               Exercises Shoulder Exercises Elbow Flexion: AROM;Left;Seated;10 reps (0-90 degrees) Wrist Flexion: AROM;Left;10 reps;Seated Digit Composite Flexion: AROM;Left;10 reps;Seated Donning/doffing sling/immobilizer: Minimal assistance;Patient able to independently direct caregiver Correct positioning of sling/immobilizer: Minimal assistance;Patient able to independently direct caregiver ROM for elbow, wrist and digits of operated UE: Supervision/safety (verbal cues for not moving shoulder ) Sling wearing schedule (on at all times/off for ADL's): Supervision/safety Proper positioning of operated UE when showering: Minimal assistance;Patient able to independently direct caregiver   Shoulder Instructions Shoulder Instructions Donning/doffing sling/immobilizer: Minimal assistance;Patient able to independently direct caregiver Correct positioning of sling/immobilizer: Minimal assistance;Patient able to independently direct caregiver ROM for elbow, wrist and digits of operated UE: Supervision/safety (verbal cues for not moving  shoulder ) Sling wearing schedule (on at all times/off for ADL's): Supervision/safety Proper positioning of operated UE when showering: Minimal assistance;Patient able to independently direct caregiver     General Comments      Pertinent Vitals/ Pain       Pain  Assessment: 0-10 Pain Score: 3  Pain Location: L upper arm with elbow ROM Pain Descriptors / Indicators: Grimacing;Sharp Pain Intervention(s): Limited activity within patient's tolerance;Monitored during session;Repositioned;Ice applied  Home Living                                          Prior Functioning/Environment              Frequency Min 3X/week     Progress Toward Goals  OT Goals(current goals can now be found in the care plan section)  Progress towards OT goals: Progressing toward goals  ADL Goals Pt Will Perform Tub/Shower Transfer: Tub transfer;with supervision;ambulating  Plan Discharge plan remains appropriate    Co-evaluation                 End of Session Equipment Utilized During Treatment: Other (comment) (sling)   Activity Tolerance Patient tolerated treatment well   Patient Left in chair;with call bell/phone within reach   Nurse Communication          Time: 0922-0952 OT Time Calculation (min): 30 min  Charges: OT General Charges $OT Visit: 1 Procedure OT Treatments $Self Care/Home Management : 8-22 mins $Therapeutic Exercise: 8-22 mins  Binnie Kand M.S., OTR/L Pager: [phone]  04/22/2015, 10:05 AM

## 2015-04-22 NOTE — Progress Notes (Signed)
   Subjective:  Patient reports pain as mild.  Objective:   VITALS:   Filed Vitals:   04/21/15 0444 04/21/15 1245 04/21/15 2334 04/22/15 0627  BP: 131/91 160/95 163/89 129/89  Pulse: 91 83 78 73  Temp: 98.6 F (37 C) 98.4 F (36.9 C) 98.2 F (36.8 C) 97.3 F (36.3 C)  TempSrc: Oral Oral Oral Oral  Resp: 16 18 16 16   Height:      Weight:      SpO2: 95% 98% 97% 97%    Neurologically intact Neurovascular intact Sensation intact distally Intact pulses distally Dorsiflexion/Plantar flexion intact Incision: dressing C/D/I and no drainage No cellulitis present Compartment soft radial nerve intact   Lab Results  Component Value Date   WBC 8.8 04/22/2015   HGB 13.6 04/22/2015   HCT 40.1 04/22/2015   MCV 94.4 04/22/2015   PLT 206 04/22/2015     Assessment/Plan:  2 Days Post-Op   - pin site care starting today BID - OT for elbow, wrist, hand ROM only - NWB LUE - ID following - may switch to vanc/rocephin today - cx NGTD   Marianna Payment 04/22/2015, 7:52 AM [phone]

## 2015-04-22 NOTE — Progress Notes (Addendum)
[facility] for Infectious [facility]    Date of Admission:  04/20/2015   Total days of antibiotics 3        Zosyn 10/4>>10/6        Vancomycin 10/4>>        Rifampin 10/6>>      Subjective: No acute events overnight. Patient reports he is feeling well. Denies fevers, chills, pain in his arm.   Medications:  . ALPRAZolam  0.5 mg Oral q morning - 10a  . amLODipine  5 mg Oral Daily  . aspirin EC  81 mg Oral q1800  . dutasteride  0.5 mg Oral Daily  . gabapentin  300 mg Oral QHS  . losartan  100 mg Oral Q2000  . multivitamin with minerals  1 tablet Oral Daily  . pantoprazole  40 mg Oral Daily  . piperacillin-tazobactam (ZOSYN)  IV  3.375 g Intravenous 3 times per day  . spironolactone  25 mg Oral Daily  . vancomycin  1,250 mg Intravenous Q12H    Objective: Vital signs in last 24 hours: Temp:  [97.3 F (36.3 C)-98.4 F (36.9 C)] 97.3 F (36.3 C) (10/06 0627) Pulse Rate:  [73-83] 73 (10/06 0627) Resp:  [16-18] 16 (10/06 0627) BP: (129-163)/(89-95) 146/90 mmHg (10/06 0832) SpO2:  [97 %-98 %] 97 % (10/06 0627)   General: alert, sitting up in chair, NAD HEENT: normal CV: RRR, no m/g/r Pulm: CTA bilaterally, breaths non-labored Ext: LUE in sling with clean bandage wrapped around extremity. External fixator in place.   Lab Results  Recent Labs  04/21/15 0430 04/22/15 0557  WBC  --  8.8  HGB  --  13.6  HCT  --  40.1  NA 134* 137  K 4.2 3.8  CL 99* 104  CO2 25 26  BUN 13 15  CREATININE 0.97 0.85   Liver Panel No results for input(s): PROT, ALBUMIN, AST, ALT, ALKPHOS, BILITOT, BILIDIR, IBILI in the last 72 hours. Sedimentation Rate No results for input(s): ESRSEDRATE in the last 72 hours. C-Reactive Protein  Recent Labs  04/22/15 0557  CRP 1.0*    Microbiology:  Studies/Results: Dg Humerus Left  04/20/2015   CLINICAL DATA:  Surgery: (ORIF) LEFT HUMERAL SHAFT NONUNION ATTEMPTED, REMOVAL OF PLATE and SCREWS APPLICATION OF EXTERNAL FIXATOR LEFT HUMERUS  PLACEMENT OF ANTIBIOTIC BEADS  EXAM: DG C-ARM GT 120 MIN; LEFT HUMERUS - 2+ VIEW  FLUOROSCOPY TIME:  Fluoroscopy Time (in minutes and seconds): 1 minutes and 5 seconds  Number of Acquired Images:  7  COMPARISON:  12/09/2014  FINDINGS: Provide images show removal of the orthopedic hardware followed by placement of an external fixator crossing the ununited fracture of the midshaft of the humerus. Primary fracture components appear well aligned following placement of the external fixator. Antibiotic impregnated beads are seen surrounding the fracture line.  IMPRESSION: Well aligned left humerus fracture following placement of an external fixator after removal of the previous fixation plate and screws.   Electronically Signed   By: Lajean Manes M.D.   On: 04/20/2015 15:59   Dg C-arm Gt 120 Min  04/20/2015   CLINICAL DATA:  Surgery: (ORIF) LEFT HUMERAL SHAFT NONUNION ATTEMPTED, REMOVAL OF PLATE and SCREWS APPLICATION OF EXTERNAL FIXATOR LEFT HUMERUS PLACEMENT OF ANTIBIOTIC BEADS  EXAM: DG C-ARM GT 120 MIN; LEFT HUMERUS - 2+ VIEW  FLUOROSCOPY TIME:  Fluoroscopy Time (in minutes and seconds): 1 minutes and 5 seconds  Number of Acquired Images:  7  COMPARISON:  12/09/2014  FINDINGS: Provide  images show removal of the orthopedic hardware followed by placement of an external fixator crossing the ununited fracture of the midshaft of the humerus. Primary fracture components appear well aligned following placement of the external fixator. Antibiotic impregnated beads are seen surrounding the fracture line.  IMPRESSION: Well aligned left humerus fracture following placement of an external fixator after removal of the previous fixation plate and screws.   Electronically Signed   By: Lajean Manes M.D.   On: 04/20/2015 15:59     Assessment/Plan: Tanner Garza is a 69yo man who underwent recent surgery for a left humeral shaft fracture and distal radial fracture who was admitted on 10/4 for irrigation and debridement of his  left humerus due to concern for early osteomyelitis due to hardware loosening from infection   Infected Left Humerus Nonunion: s/p I&D and humeral plate/screw removal, now with external fixator on 10/4. Tissue culture with a few coagulase negative staph. -  Will discontinue Zosyn. ESR and CRP only mildly elevated.  - Continue Vancomycin, plan to treat for 4 wk of IV vancomycin and then will transition to orals if needed - will add Rifampin 300 mg BID with meals - f/u culture - will need baseline LFT while on rifampin - will get picc line placed in right arm -------------------------------------------------------------- ABTX orders for home health listed below:  Diagnosis: Early osteomyelitis, hardware infection of left humerus  Culture Result: CoNS  Allergies  Allergen Reactions  . Atenolol     bradycardia  . Chantix [Varenicline]     Sedation   . Hctz [Hydrochlorothiazide]     Bradycardia   . Other     bandaid -rash    Discharge antibiotics: vancomycin Per pharmacy protocol, goal trough of 15-20. Will need weekly cbc, bmp, vanco trough Duration: 4 wk, 28 days using day 1 as 10/5 End Date: 05/19/2015  [facility] Care Per Protocol: Labs weekly while on IV antibiotics: x_ CBC with differential _x CMP __ CRP __ ESR x Vancomycin trough  Fax weekly labs to [phone]  Clinic Follow Up Appt: Will be with Dr. Baxter Flattery in 4 wk     Caren Griffins B[facility] [phone]   Albin Felling, MD, MPH Internal Medicine Resident, PGY-II Pager: [phone]  04/22/2015, 8:57 AM

## 2015-04-22 NOTE — Progress Notes (Signed)
Per Joaquim Lai, RT pt able to put CPAP on when ready to use. RT will monitor.

## 2015-04-23 LAB — HEPATITIS C ANTIBODY

## 2015-04-23 LAB — BASIC METABOLIC PANEL
ANION GAP: 12 (ref 5–15)
BUN: 16 mg/dL (ref 6–20)
CALCIUM: 9.4 mg/dL (ref 8.9–10.3)
CO2: 26 mmol/L (ref 22–32)
Chloride: 100 mmol/L — ABNORMAL LOW (ref 101–111)
Creatinine, Ser: 0.96 mg/dL (ref 0.61–1.24)
Glucose, Bld: 124 mg/dL — ABNORMAL HIGH (ref 65–99)
POTASSIUM: 3.8 mmol/L (ref 3.5–5.1)
Sodium: 138 mmol/L (ref 135–145)

## 2015-04-23 LAB — HEPATIC FUNCTION PANEL
ALBUMIN: 3 g/dL — AB (ref 3.5–5.0)
ALT: 34 U/L (ref 17–63)
AST: 35 U/L (ref 15–41)
Alkaline Phosphatase: 65 U/L (ref 38–126)
Bilirubin, Direct: 0.4 mg/dL (ref 0.1–0.5)
Indirect Bilirubin: 0.7 mg/dL (ref 0.3–0.9)
Total Bilirubin: 1.1 mg/dL (ref 0.3–1.2)
Total Protein: 6.6 g/dL (ref 6.5–8.1)

## 2015-04-23 MED ORDER — ZINC SULFATE 220 (50 ZN) MG PO CAPS: 220.0000 mg | ORAL_CAPSULE | Freq: Every day | ORAL | Status: DC

## 2015-04-23 MED ORDER — VITAMIN C 500 MG PO CHEW: 500.0000 mg | CHEWABLE_TABLET | Freq: Two times a day (BID) | ORAL | Status: DC

## 2015-04-23 MED ORDER — SODIUM CHLORIDE 0.9 % IJ SOLN: 10.0000 mL | INTRAMUSCULAR | Status: DC | PRN

## 2015-04-23 MED ORDER — RIFAMPIN 300 MG PO CAPS: 300.0000 mg | ORAL_CAPSULE | Freq: Two times a day (BID) | ORAL | Status: DC

## 2015-04-23 MED ORDER — OXYCODONE HCL 5 MG PO TABS: 5.0000 mg | ORAL_TABLET | ORAL | Status: DC | PRN

## 2015-04-23 MED ORDER — VANCOMYCIN HCL 10 G IV SOLR: 1250.0000 mg | Freq: Two times a day (BID) | INTRAVENOUS | Status: DC

## 2015-04-23 NOTE — Progress Notes (Signed)
[facility] for Infectious Disease    Date of Admission:  04/20/2015   Total days of antibiotics 3        Zosyn 10/4>>10/6        Vancomycin 10/4>>        Rifampin 10/6>>      Subjective: No acute events overnight. Patient wanting to go home. Denies fevers, chills, pain in his LUE.   Medications:  . ALPRAZolam  0.5 mg Oral q morning - 10a  . amLODipine  5 mg Oral Daily  . aspirin EC  81 mg Oral q1800  . dutasteride  0.5 mg Oral Daily  . gabapentin  300 mg Oral QHS  . losartan  100 mg Oral Q2000  . multivitamin with minerals  1 tablet Oral Daily  . pantoprazole  40 mg Oral Daily  . rifampin  300 mg Oral Q12H  . spironolactone  25 mg Oral Daily  . vancomycin  1,250 mg Intravenous Q12H    Objective: Vital signs in last 24 hours: Temp:  [97.3 F (36.3 C)-99.1 F (37.3 C)] 98.6 F (37 C) (10/07 1300) Pulse Rate:  [78-86] 78 (10/07 1300) Resp:  [18] 18 (10/07 1300) BP: (128-171)/(88-145) 171/145 mmHg (10/07 1300) SpO2:  [97 %-98 %] 98 % (10/07 1300)   General: alert, sitting up in chair, NAD HEENT: normal CV: RRR, no m/g/r Pulm: CTA bilaterally, breaths non-labored Ext: LUE in sling with clean bandage wrapped around extremity. External fixator in place.   Lab Results  Recent Labs  04/22/15 0557 04/23/15 0456  WBC 8.8  --   HGB 13.6  --   HCT 40.1  --   NA 137 138  K 3.8 3.8  CL 104 100*  CO2 26 26  BUN 15 16  CREATININE 0.85 0.96   Liver Panel  Recent Labs  04/23/15 0930  PROT 6.6  ALBUMIN 3.0*  AST 35  ALT 34  ALKPHOS 65  BILITOT 1.1  BILIDIR 0.4  IBILI 0.7   Sedimentation Rate  Recent Labs  04/22/15 0557  ESRSEDRATE 55*   C-Reactive Protein  Recent Labs  04/22/15 0557  CRP 1.0*    Microbiology:  Studies/Results: No results found.   Assessment/Plan: Tanner Garza is a 69yo man who underwent recent surgery for a left humeral shaft fracture and distal radial fracture who was admitted on 10/4 for irrigation and  debridement of his left humerus due to concern for early osteomyelitis due to hardware loosening from infection   Infected Left Humerus Nonunion: s/p I&D and humeral plate/screw removal, now with external fixator on 10/4. Tissue culture with a few coagulase negative staph. - Continue Vancomycin. Awaiting culture sensitivities. Would prefer to switch to Ceftriaxone or Cefazolin if sensitive. Will see if sensitivities back by tomorrow.  - Continue Rifampin 300 mg BID with meals - f/u culture - Baseline LFTs within normal limits - Will need PICC in right arm placed  -------------------------------------------------------------- ABTX orders for home health listed below:  Diagnosis: Early osteomyelitis, hardware infection of left humerus  Culture Result: CoNS  Allergies  Allergen Reactions  . Atenolol     bradycardia  . Chantix [Varenicline]     Sedation   . Hctz [Hydrochlorothiazide]     Bradycardia   . Other     bandaid -rash    Discharge antibiotics: vancomycin Per pharmacy protocol, goal trough of 15-20. Will need weekly cbc, bmp, vanco trough Duration: 4 wk, 28 days using day 1 as 10/5 End Date: 05/19/2015  [facility] Care Per Protocol: Labs weekly while on IV antibiotics: x_ CBC with differential _x CMP __ CRP __ ESR x Vancomycin trough  Fax weekly labs to [phone]  Clinic Follow Up Appt: Will be with Dr. Baxter Flattery in 4 wk   Albin Felling, MD, MPH Internal Medicine Resident, PGY-II Pager: [phone]  04/23/2015, 4:53 PM

## 2015-04-23 NOTE — Care Management Note (Signed)
Case Management Note  Patient Details  Name: Tanner Garza MRN: [id number] Date of Birth: 1946/04/09  Subjective/Objective:    69 yr old male s/p ORIF left humerus non-union with application of external fixator.                Action/Plan: Case manager spoke with patient concerning home health needs at discharge. Patient will need nursing for IV antibiotics. Will get PICC placed today. Choice was offered. Referral was called to [place] [facility] and to Lindy, [facility] Liaison.   Expected Discharge Date:   04/23/15               Expected Discharge Plan:  [place]  In-House Referral:     Discharge planning Services  CM Consult  Post Acute Care Choice:  Home Health Choice offered to:  Patient  DME Arranged:  IV pump/equipment DME Agency:  [place]:  RN [facility] Agency:  [place]  Status of Service:  Completed, signed off  Medicare Important Message Given:    Date Medicare IM Given:    Medicare IM give by:    Date Additional Medicare IM Given:    Additional Medicare Important Message give by:     If discussed at [place] of Stay Meetings, dates discussed:    Additional Comments:  Ninfa Meeker, RN 04/23/2015, 8:53 AM

## 2015-04-23 NOTE — Care Management Important Message (Signed)
Important Message  Patient Details  Name: SID GREENER MRN: [id number] Date of Birth: [birth year]   Medicare Important Message Given:  Yes-second notification given    Delorse Lek 04/23/2015, 11:25 AM

## 2015-04-23 NOTE — Progress Notes (Signed)
Peripherally Inserted Central Catheter/Midline Placement  The IV Nurse has discussed with the patient and/or persons authorized to consent for the patient, the purpose of this procedure and the potential benefits and risks involved with this procedure.  The benefits include less needle sticks, lab draws from the catheter and patient may be discharged home with the catheter.  Risks include, but not limited to, infection, bleeding, blood clot (thrombus formation), and puncture of an artery; nerve damage and irregular heat beat.  Alternatives to this procedure were also discussed.  PICC/Midline Placement Documentation  PICC / Midline Single Lumen 90/93/11 PICC Right Basilic 44 cm 0 cm (Active)  Indication for Insertion or Continuance of Line Home intravenous therapies (PICC only) 04/23/2015 10:36 AM  Exposed Catheter (cm) 0 cm 04/23/2015 10:36 AM  Dressing Change Due 04/30/15 04/23/2015 10:36 AM       Christella Noa Albarece 04/23/2015, 10:37 AM

## 2015-04-23 NOTE — Progress Notes (Signed)
ANTIBIOTIC CONSULT NOTE - FOLLOW UP  Pharmacy Consult for Vancomycin Indication: infected L humerus nonunion - concern for early osteomyelitis  Allergies  Allergen Reactions  . Atenolol     bradycardia  . Chantix [Varenicline]     Sedation   . Hctz [Hydrochlorothiazide]     Bradycardia   . Other     bandaid -rash    Patient Measurements: Height: 6' (182.9 cm) Weight: 271 lb 3.2 oz (123.016 kg) IBW/kg (Calculated) : 77.6  Vital Signs: Temp: 97.3 F (36.3 C) (10/07 0416) Temp Source: Oral (10/07 0416) BP: 145/91 mmHg (10/07 0836) Pulse Rate: 78 (10/07 0416) Intake/Output from previous day: 10/06 0701 - 10/07 0700 In: 2000 [P.O.:1500; IV Piggyback:500] Out: 750 [Urine:750] Intake/Output from this shift:    Labs:  Recent Labs  04/21/15 0430 04/22/15 0557 04/23/15 0456  WBC  --  8.8  --   HGB  --  13.6  --   PLT  --  206  --   CREATININE 0.97 0.85 0.96   Estimated Creatinine Clearance: 99.8 mL/min (by C-G formula based on Cr of 0.96).  Recent Labs  04/21/15 2024  [place] 24*     Microbiology: Recent Results (from the past 720 hour(s))  Anaerobic culture     Status: None (Preliminary result)   Collection Time: 04/20/15 12:00 PM  Result Value Ref Range Status   Specimen Description TISSUE LEFT ARM  Final   Special Requests NONUNION HUMERUS  Final   Gram Stain   Final    NO WBC SEEN NO ORGANISMS SEEN Performed at [facility]    Culture   Final    NO ANAEROBES ISOLATED; CULTURE IN PROGRESS FOR 5 DAYS Performed at [facility]    Report Status PENDING  Incomplete  Tissue culture     Status: None (Preliminary result)   Collection Time: 04/20/15 12:00 PM  Result Value Ref Range Status   Specimen Description TISSUE  Final   Special Requests NON UNION HUMERUS  Final   Gram Stain   Final    NO WBC SEEN NO ORGANISMS SEEN Performed at [facility]    Culture   Final    FEW STAPHYLOCOCCUS SPECIES (COAGULASE  NEGATIVE) Performed at [facility]    Report Status PENDING  Incomplete  Anaerobic culture     Status: None (Preliminary result)   Collection Time: 04/20/15  1:12 PM  Result Value Ref Range Status   Specimen Description WOUND ARM LEFT  Final   Special Requests SPEC A LEFT HUMERAL SHAFT NONUNION  Final   Gram Stain   Final    FEW WBC PRESENT,BOTH PMN AND MONONUCLEAR NO SQUAMOUS EPITHELIAL CELLS SEEN NO ORGANISMS SEEN Performed at [facility]    Culture   Final    NO ANAEROBES ISOLATED; CULTURE IN PROGRESS FOR 5 DAYS Performed at [facility]    Report Status PENDING  Incomplete  Wound culture     Status: None (Preliminary result)   Collection Time: 04/20/15  1:12 PM  Result Value Ref Range Status   Specimen Description WOUND ARM LEFT  Final   Special Requests SPEC A LEFT HUMERAL SHAFT NONUNION  Final   Gram Stain   Final    FEW WBC PRESENT,BOTH PMN AND MONONUCLEAR NO SQUAMOUS EPITHELIAL CELLS SEEN NO ORGANISMS SEEN Performed at [facility]    Culture   Final    NO GROWTH 2 DAYS Performed at [facility]    Report  Status PENDING  Incomplete  Anaerobic culture     Status: None (Preliminary result)   Collection Time: 04/20/15  1:13 PM  Result Value Ref Range Status   Specimen Description WOUND ARM LEFT  Final   Special Requests SPEC B LEFT HUMEROUS NON UNION FRACTURE SITE  Final   Gram Stain   Final    FEW WBC PRESENT,BOTH PMN AND MONONUCLEAR NO SQUAMOUS EPITHELIAL CELLS SEEN NO ORGANISMS SEEN Performed at [facility]    Culture   Final    NO ANAEROBES ISOLATED; CULTURE IN PROGRESS FOR 5 DAYS Performed at [facility]    Report Status PENDING  Incomplete  Wound culture     Status: None (Preliminary result)   Collection Time: 04/20/15  1:13 PM  Result Value Ref Range Status   Specimen Description WOUND ARM LEFT  Final   Special Requests SPEC B LEFT HUMEROUS NON UNION FRACTURE SITE  Final   Gram Stain    Final    FEW WBC PRESENT,BOTH PMN AND MONONUCLEAR NO SQUAMOUS EPITHELIAL CELLS SEEN NO ORGANISMS SEEN Performed at [facility]    Culture   Final    NO GROWTH 2 DAYS Performed at [facility]    Report Status PENDING  Incomplete    Anti-infectives    Start     Dose/Rate Route Frequency Ordered Stop   04/23/15 0000  rifampin (RIFADIN) 300 MG capsule     300 mg Oral 2 times daily 04/23/15 0827     04/23/15 0000  vancomycin 1,250 mg in sodium chloride 0.9 % 250 mL     1,250 mg 166.7 mL/hr over 90 Minutes Intravenous Every 12 hours 04/23/15 0827     04/22/15 1515  rifampin (RIFADIN) capsule 300 mg     300 mg Oral Every 12 hours 04/22/15 1513     04/22/15 0900  vancomycin (VANCOCIN) 1,250 mg in sodium chloride 0.9 % 250 mL IVPB     1,250 mg 166.7 mL/hr over 90 Minutes Intravenous Every 12 hours 04/21/15 2149     04/20/15 2200  piperacillin-tazobactam (ZOSYN) IVPB 3.375 g  Status:  Discontinued     3.375 g 12.5 mL/hr over 240 Minutes Intravenous 3 times per day 04/20/15 1739 04/22/15 1513   04/20/15 2100  vancomycin (VANCOCIN) 1,250 mg in sodium chloride 0.9 % 250 mL IVPB  Status:  Discontinued     1,250 mg 166.7 mL/hr over 90 Minutes Intravenous Every 8 hours 04/20/15 1743 04/21/15 2149   04/20/15 1401  gentamicin (GARAMYCIN) injection  Status:  Discontinued       As needed 04/20/15 1402 04/20/15 1506   04/20/15 1359  vancomycin (VANCOCIN) powder  Status:  Discontinued       As needed 04/20/15 1400 04/20/15 1506   04/20/15 1315  piperacillin-tazobactam (ZOSYN) IVPB 3.375 g     3.375 g 100 mL/hr over 30 Minutes Intravenous  Once 04/20/15 1312 04/20/15 1412   04/20/15 1200  vancomycin (VANCOCIN) 1,500 mg in sodium chloride 0.9 % 500 mL IVPB     1,500 mg 250 mL/hr over 120 Minutes Intravenous To Surgery 04/20/15 1149 04/20/15 1408      Assessment: 69 yo M continues on day # 4 IV  Vancomycin for wound infection, possible early osteomyelitis.  Levels were obtained  10/5 and dose was adjusted accordingly.  Pt ideally needs a Vancomycin level 10/8 with new dosing regimen.  Noted plans for PICC placement and IV antibiotics at discharge for 4 weeks.  Possible  d/c home today.  Goal of Therapy:  Vancomycin trough level 15-20 mcg/ml  Plan:  Continue Vancomycin 1250 mg IV q12h (currently at 0900 and 2100) If discharged home, recommend Vancomycin trough to be drawn 10/8 at 0900 be home health.  [employer], Pharm.D., BCPS Clinical Pharmacist Pager [phone] 04/23/2015 10:41 AM

## 2015-04-23 NOTE — Progress Notes (Signed)
Called pharmacy to see if I could give his 2200 vanco early they said yes due to his discharge

## 2015-04-23 NOTE — Progress Notes (Signed)
[facility]  Patient Status: New pt for [facility] this admission  [facility] is providing the following services: HHRN and Home Infusion Pharmacy team for home IV ABX.  [facility] hospital infusion coordinator will provide in hospital teaching with pt re: home IV ABX set up and administration to promote independence at home upon DC.   If patient discharges after hours, please call [phone].   Tanner Garza 04/23/2015, 9:24 AM

## 2015-04-23 NOTE — Progress Notes (Signed)
Occupational Therapy Treatment Patient Details Name: Tanner Garza MRN: [id number] DOB: [birth year] Today's Date: 04/23/2015    History of present illness OPEN REDUCTION INTERNAL FIXATION (ORIF) LEFT HUMERAL SHAFT NONUNION ATTEMPTED, REMOVAL OF PLATE and SCREWS APPLICATION OF EXTERNAL FIXATOR LEFT HUMERUS PLACEMENT OF ANTIBIOTIC BEADS (Left)   OT comments  Pt met all OT goals. Declined UB ADL, prefers wife to assist. Pt able to verbalize correct UB dressing/bathing technique. Educated pt on proper positioning of LUE while in bed. Educated and demonstrated no ROM in shoulder during elbow exercise; pt verbalized understanding. All goals met and education completed. At this time pt to be d/c from acute OT services. Pt to follow up per surgeon.    Follow Up Recommendations  Other (comment);Supervision - Intermittent (Follow up per surgeon)    Equipment Recommendations  None recommended by OT    Recommendations for Other Services      Precautions / Restrictions Precautions Precautions: Shoulder Type of Shoulder Precautions: No ROM at shoulder. Elbow/wrist/hand AROM ok. Sling on at all times except for bathing, dressing and exercises Shoulder Interventions: Shoulder sling/immobilizer;At all times;Off for dressing/bathing/exercises Precaution Comments: Reviewed precautions Required Braces or Orthoses: Sling Restrictions Weight Bearing Restrictions: Yes LUE Weight Bearing: Non weight bearing       Mobility Bed Mobility               General bed mobility comments: Pt in recliner  Transfers                      Balance                                   ADL Overall ADL's : Needs assistance/impaired                                       General ADL Comments: Pt declined completing ADL activity today, stated that he would prefer his wife assist. Pt able to verbalize correct UB dressing/bathing technique. Educated pt on proper  positioning of UE while in bed. Educated and demonstrated no ROM in shoulder while completing exercises.       Vision                     Perception     Praxis      Cognition   Behavior During Therapy: [facility] for tasks assessed/performed Overall Cognitive Status: Within Functional Limits for tasks assessed                       Extremity/Trunk Assessment               Exercises Shoulder Exercises Wrist Flexion: AROM;Left;10 reps;Seated Digit Composite Flexion: AROM;Left;10 reps;Seated;Squeeze ball Donning/doffing shirt without moving shoulder: Minimal assistance;Patient able to independently direct caregiver Method for sponge bathing under operated UE: Minimal assistance;Patient able to independently direct caregiver Donning/doffing sling/immobilizer: Minimal assistance;Patient able to independently direct caregiver Correct positioning of sling/immobilizer: Minimal assistance;Patient able to independently direct caregiver ROM for elbow, wrist and digits of operated UE: Supervision/safety Sling wearing schedule (on at all times/off for ADL's): Supervision/safety Proper positioning of operated UE when showering: Minimal assistance;Patient able to independently direct caregiver Positioning of UE while sleeping: Minimal assistance;Patient able to independently direct caregiver   Shoulder Instructions Shoulder Instructions Donning/doffing shirt without  moving shoulder: Minimal assistance;Patient able to independently direct caregiver Method for sponge bathing under operated UE: Minimal assistance;Patient able to independently direct caregiver Donning/doffing sling/immobilizer: Minimal assistance;Patient able to independently direct caregiver Correct positioning of sling/immobilizer: Minimal assistance;Patient able to independently direct caregiver ROM for elbow, wrist and digits of operated UE: Supervision/safety Sling wearing schedule (on at all times/off for  ADL's): Supervision/safety Proper positioning of operated UE when showering: Minimal assistance;Patient able to independently direct caregiver Positioning of UE while sleeping: Minimal assistance;Patient able to independently direct caregiver     General Comments      Pertinent Vitals/ Pain       Pain Assessment: Faces Faces Pain Scale: Hurts a little bit Pain Location: L upper arm Pain Descriptors / Indicators: Sore Pain Intervention(s): Limited activity within patient's tolerance;Monitored during session  Home Living                                          Prior Functioning/Environment              Frequency       Progress Toward Goals  OT Goals(current goals can now be found in the care plan section)  Progress towards OT goals: Goals met/education completed, patient discharged from OT  Acute Rehab OT Goals Patient Stated Goal: to go home today  Plan All goals met and education completed, patient discharged from OT services    Co-evaluation                 End of Session Equipment Utilized During Treatment: Other (comment) (sling)   Activity Tolerance Patient tolerated treatment well   Patient Left in chair;with call bell/phone within reach   Nurse Communication          Time: 1610-9604 OT Time Calculation (min): 11 min  Charges: OT General Charges $OT Visit: 1 Procedure OT Treatments $Self Care/Home Management : 8-22 mins  Binnie Kand M.S., OTR/L Pager: [phone]  04/23/2015, 11:15 AM

## 2015-04-24 DIAGNOSIS — Z452 Encounter for adjustment and management of vascular access device: Secondary | ICD-10-CM | POA: Diagnosis not present

## 2015-04-24 DIAGNOSIS — K219 Gastro-esophageal reflux disease without esophagitis: Secondary | ICD-10-CM | POA: Diagnosis not present

## 2015-04-24 DIAGNOSIS — T84610D Infection and inflammatory reaction due to internal fixation device of right humerus, subsequent encounter: Secondary | ICD-10-CM | POA: Diagnosis not present

## 2015-04-24 DIAGNOSIS — S42202K Unspecified fracture of upper end of left humerus, subsequent encounter for fracture with nonunion: Secondary | ICD-10-CM | POA: Diagnosis not present

## 2015-04-24 DIAGNOSIS — B958 Unspecified staphylococcus as the cause of diseases classified elsewhere: Secondary | ICD-10-CM | POA: Insufficient documentation

## 2015-04-24 DIAGNOSIS — I10 Essential (primary) hypertension: Secondary | ICD-10-CM | POA: Diagnosis not present

## 2015-04-24 DIAGNOSIS — E785 Hyperlipidemia, unspecified: Secondary | ICD-10-CM | POA: Diagnosis not present

## 2015-04-24 DIAGNOSIS — Z720 Tobacco use: Secondary | ICD-10-CM | POA: Diagnosis not present

## 2015-04-24 DIAGNOSIS — T847XXA Infection and inflammatory reaction due to other internal orthopedic prosthetic devices, implants and grafts, initial encounter: Secondary | ICD-10-CM | POA: Insufficient documentation

## 2015-04-24 DIAGNOSIS — F419 Anxiety disorder, unspecified: Secondary | ICD-10-CM | POA: Diagnosis not present

## 2015-04-24 DIAGNOSIS — Z5181 Encounter for therapeutic drug level monitoring: Secondary | ICD-10-CM | POA: Diagnosis not present

## 2015-04-25 NOTE — Discharge Summary (Signed)
Physician Discharge Summary      Patient ID: Tanner Garza MRN: [id number] DOB/AGE: December 20, 1945 69 y.o.  Admit date: 04/20/2015 Discharge date: 04/25/2015  Admission Diagnoses:  Left humerus infected nonunion  Discharge Diagnoses:  Active Problems:   Humerus fracture   Staphylococcal infection   Hardware complicating wound infection [facility])   Past Medical History  Diagnosis Date  . Hypertension   . BPH (benign prostatic hyperplasia)   . Anxiety   . Esophageal reflux   . Bell's palsy 09/2001  . Hyperlipidemia   . Eczema   . Colon polyp   . OSA (obstructive sleep apnea)   . SVT (supraventricular tachycardia) ([place])   . Complication of anesthesia     Slow to awaken  . Dysrhythmia     2 "bouts of tachycardia    Surgeries: Procedure(s): OPEN REDUCTION INTERNAL FIXATION (ORIF) LEFT HUMERAL SHAFT NONUNION ATTEMPTED, REMOVAL OF PLATE and SCREWS APPLICATION OF EXTERNAL FIXATOR LEFT HUMERUS PLACEMENT OF ANTIBIOTIC BEADS on 04/20/2015   Consultants (if any):    Discharged Condition: Improved  Hospital Course: Tanner Garza is an 69 y.o. male who was admitted 04/20/2015 with a diagnosis of left humerus infected nonunion and went to the operating room on 04/20/2015 and underwent the above named procedures.    He was given perioperative antibiotics:      Anti-infectives    Start     Dose/Rate Route Frequency Ordered Stop   04/23/15 0000  rifampin (RIFADIN) 300 MG capsule     300 mg Oral 2 times daily 04/23/15 0827     04/23/15 0000  vancomycin 1,250 mg in sodium chloride 0.9 % 250 mL     1,250 mg 166.7 mL/hr over 90 Minutes Intravenous Every 12 hours 04/23/15 0827     04/22/15 1515  rifampin (RIFADIN) capsule 300 mg  Status:  Discontinued     300 mg Oral Every 12 hours 04/22/15 1513 04/23/15 2154   04/22/15 0900  vancomycin (VANCOCIN) 1,250 mg in sodium chloride 0.9 % 250 mL IVPB  Status:  Discontinued     1,250 mg 166.7 mL/hr over 90 Minutes Intravenous Every 12 hours  04/21/15 2149 04/23/15 2154   04/20/15 2200  piperacillin-tazobactam (ZOSYN) IVPB 3.375 g  Status:  Discontinued     3.375 g 12.5 mL/hr over 240 Minutes Intravenous 3 times per day 04/20/15 1739 04/22/15 1513   04/20/15 2100  vancomycin (VANCOCIN) 1,250 mg in sodium chloride 0.9 % 250 mL IVPB  Status:  Discontinued     1,250 mg 166.7 mL/hr over 90 Minutes Intravenous Every 8 hours 04/20/15 1743 04/21/15 2149   04/20/15 1401  gentamicin (GARAMYCIN) injection  Status:  Discontinued       As needed 04/20/15 1402 04/20/15 1506   04/20/15 1359  vancomycin (VANCOCIN) powder  Status:  Discontinued       As needed 04/20/15 1400 04/20/15 1506   04/20/15 1315  piperacillin-tazobactam (ZOSYN) IVPB 3.375 g     3.375 g 100 mL/hr over 30 Minutes Intravenous  Once 04/20/15 1312 04/20/15 1412   04/20/15 1200  vancomycin (VANCOCIN) 1,500 mg in sodium chloride 0.9 % 500 mL IVPB     1,500 mg 250 mL/hr over 120 Minutes Intravenous To Surgery 04/20/15 1149 04/20/15 1408    .  He was given sequential compression devices, early ambulation for DVT prophylaxis.  He benefited maximally from the hospital stay and there were no complications.    Recent vital signs:  Filed Vitals:   04/23/15 1300  BP: 171/145  Pulse: 78  Temp: 98.6 F (37 C)  Resp: 18    Recent laboratory studies:  Lab Results  Component Value Date   HGB 13.6 04/22/2015   HGB 14.4 04/16/2015   HGB 14.6 12/05/2014   Lab Results  Component Value Date   WBC 8.8 04/22/2015   PLT 206 04/22/2015   No results found for: INR Lab Results  Component Value Date   NA 138 04/23/2015   K 3.8 04/23/2015   CL 100* 04/23/2015   CO2 26 04/23/2015   BUN 16 04/23/2015   CREATININE 0.96 04/23/2015   GLUCOSE 124* 04/23/2015    Discharge Medications:     Medication List    STOP taking these medications        meloxicam 15 MG tablet  Commonly known as:  MOBIC      TAKE these medications        ALPRAZolam 0.5 MG tablet  Commonly  known as:  XANAX  Take 0.5 mg by mouth every morning.     amLODipine 5 MG tablet  Commonly known as:  NORVASC  Take 5 mg by mouth every morning.     aspirin 81 MG tablet  Take 81 mg by mouth every evening.     dutasteride 0.5 MG capsule  Commonly known as:  AVODART  Take 0.5 mg by mouth daily with supper.     gabapentin 300 MG capsule  Commonly known as:  NEURONTIN  Take 300 mg by mouth daily with supper.     losartan 100 MG tablet  Commonly known as:  COZAAR  Take 100 mg by mouth daily with supper.     Lutein 6 MG Tabs  Take 6 mg by mouth daily with supper.     multivitamin tablet  Take 1 tablet by mouth every morning.     oxyCODONE 5 MG immediate release tablet  Commonly known as:  Oxy IR/ROXICODONE  Take 1-3 tablets (5-15 mg total) by mouth every 4 (four) hours as needed.     OXYGEN  Inhale into the lungs. CPAP     pantoprazole 40 MG tablet  Commonly known as:  PROTONIX  Take 40 mg by mouth daily with supper.     rifampin 300 MG capsule  Commonly known as:  RIFADIN  Take 1 capsule (300 mg total) by mouth 2 (two) times daily.     spironolactone 25 MG tablet  Commonly known as:  ALDACTONE  Take 25 mg by mouth every morning.     vancomycin 1,250 mg in sodium chloride 0.9 % 250 mL  Inject 1,250 mg into the vein every 12 (twelve) hours.     Vitamin C 500 MG Chew  Chew 1 tablet (500 mg total) by mouth 2 (two) times daily.     zinc sulfate 220 MG capsule  Take 1 capsule (220 mg total) by mouth daily.        Diagnostic Studies: Dg Humerus Left  04/20/2015   CLINICAL DATA:  Surgery: (ORIF) LEFT HUMERAL SHAFT NONUNION ATTEMPTED, REMOVAL OF PLATE and SCREWS APPLICATION OF EXTERNAL FIXATOR LEFT HUMERUS PLACEMENT OF ANTIBIOTIC BEADS  EXAM: DG C-ARM GT 120 MIN; LEFT HUMERUS - 2+ VIEW  FLUOROSCOPY TIME:  Fluoroscopy Time (in minutes and seconds): 1 minutes and 5 seconds  Number of Acquired Images:  7  COMPARISON:  12/09/2014  FINDINGS: Provide images show removal of  the orthopedic hardware followed by placement of an external fixator crossing the ununited fracture of the midshaft  of the humerus. Primary fracture components appear well aligned following placement of the external fixator. Antibiotic impregnated beads are seen surrounding the fracture line.  IMPRESSION: Well aligned left humerus fracture following placement of an external fixator after removal of the previous fixation plate and screws.   Electronically Signed   By: Lajean Manes M.D.   On: 04/20/2015 15:59   Dg C-arm Gt 120 Min  04/20/2015   CLINICAL DATA:  Surgery: (ORIF) LEFT HUMERAL SHAFT NONUNION ATTEMPTED, REMOVAL OF PLATE and SCREWS APPLICATION OF EXTERNAL FIXATOR LEFT HUMERUS PLACEMENT OF ANTIBIOTIC BEADS  EXAM: DG C-ARM GT 120 MIN; LEFT HUMERUS - 2+ VIEW  FLUOROSCOPY TIME:  Fluoroscopy Time (in minutes and seconds): 1 minutes and 5 seconds  Number of Acquired Images:  7  COMPARISON:  12/09/2014  FINDINGS: Provide images show removal of the orthopedic hardware followed by placement of an external fixator crossing the ununited fracture of the midshaft of the humerus. Primary fracture components appear well aligned following placement of the external fixator. Antibiotic impregnated beads are seen surrounding the fracture line.  IMPRESSION: Well aligned left humerus fracture following placement of an external fixator after removal of the previous fixation plate and screws.   Electronically Signed   By: Lajean Manes M.D.   On: 04/20/2015 15:59    Disposition: 01-Home or Self Care  Discharge Instructions    Call MD / Call 911    Complete by:  As directed   If you experience chest pain or shortness of breath, CALL 911 and be transported to the hospital emergency room.  If you develope a fever above 101.5 F, pus (white drainage) or increased drainage or redness at the wound, or calf pain, call your surgeon's office.     Constipation Prevention    Complete by:  As directed   Drink plenty of fluids.   Prune juice may be helpful.  You may use a stool softener, such as Colace (over the counter) 100 mg twice a day.  Use MiraLax (over the counter) for constipation as needed.     Diet - low sodium heart healthy    Complete by:  As directed      Diet general    Complete by:  As directed      Driving restrictions    Complete by:  As directed   No driving while taking narcotic pain meds.     Increase activity slowly as tolerated    Complete by:  As directed            Follow-up Information    Follow up with Carlyle Basques, MD In 4 weeks.   Specialty:  Infectious Diseases   Contact information:   [address] [place] [address] [phone]       Follow up with Marianna Payment, MD In 2 weeks.   Specialty:  Orthopedic Surgery   Why:  For wound re-check   Contact information:   [place] [address] [phone]       Follow up with [facility].   Why:  Someone from [place] will contact you with time for Home Health Nurse to arrive.   Contact information:   [address] [place] [address] [phone]        Signed: Marianna Payment 04/25/2015, 8:15 PM

## 2015-04-26 DIAGNOSIS — I10 Essential (primary) hypertension: Secondary | ICD-10-CM | POA: Diagnosis not present

## 2015-04-26 DIAGNOSIS — F419 Anxiety disorder, unspecified: Secondary | ICD-10-CM | POA: Diagnosis not present

## 2015-04-26 DIAGNOSIS — Z452 Encounter for adjustment and management of vascular access device: Secondary | ICD-10-CM | POA: Diagnosis not present

## 2015-04-26 DIAGNOSIS — R7881 Bacteremia: Secondary | ICD-10-CM | POA: Diagnosis not present

## 2015-04-26 DIAGNOSIS — T84610D Infection and inflammatory reaction due to internal fixation device of right humerus, subsequent encounter: Secondary | ICD-10-CM | POA: Diagnosis not present

## 2015-04-26 DIAGNOSIS — S42202K Unspecified fracture of upper end of left humerus, subsequent encounter for fracture with nonunion: Secondary | ICD-10-CM | POA: Diagnosis not present

## 2015-04-26 DIAGNOSIS — K219 Gastro-esophageal reflux disease without esophagitis: Secondary | ICD-10-CM | POA: Diagnosis not present

## 2015-04-27 ENCOUNTER — Telehealth: Payer: Self-pay | Admitting: *Deleted

## 2015-04-27 DIAGNOSIS — Z452 Encounter for adjustment and management of vascular access device: Secondary | ICD-10-CM | POA: Diagnosis not present

## 2015-04-27 DIAGNOSIS — I10 Essential (primary) hypertension: Secondary | ICD-10-CM | POA: Diagnosis not present

## 2015-04-27 DIAGNOSIS — S42202K Unspecified fracture of upper end of left humerus, subsequent encounter for fracture with nonunion: Secondary | ICD-10-CM | POA: Diagnosis not present

## 2015-04-27 DIAGNOSIS — F419 Anxiety disorder, unspecified: Secondary | ICD-10-CM | POA: Diagnosis not present

## 2015-04-27 DIAGNOSIS — K219 Gastro-esophageal reflux disease without esophagitis: Secondary | ICD-10-CM | POA: Diagnosis not present

## 2015-04-27 DIAGNOSIS — T84610D Infection and inflammatory reaction due to internal fixation device of right humerus, subsequent encounter: Secondary | ICD-10-CM | POA: Diagnosis not present

## 2015-04-27 NOTE — Telephone Encounter (Signed)
Per Dr Baxter Flattery called [facility] and changed the patient medication from Vanc to Cefazolin 2 gm q8hr. Also verified the stop date of 05/19/15 and advised to keep the same unless we call back with a change. In the mean time will verify with the doctor and call.

## 2015-04-27 NOTE — Telephone Encounter (Signed)
-----   Message from Carlyle Basques, MD sent at 04/24/2015 10:40 PM EDT ----- Can you call [facility] to see if they are following him. I would like to change him off of vanco and switch to cefazolin 2gm IV q 8hr

## 2015-04-30 LAB — ANAEROBIC CULTURE: GRAM STAIN: NONE SEEN

## 2015-04-30 LAB — TISSUE CULTURE: Gram Stain: NONE SEEN

## 2015-04-30 LAB — WOUND CULTURE
Culture: NO GROWTH
Culture: NO GROWTH

## 2015-05-03 ENCOUNTER — Encounter: Payer: Self-pay | Admitting: Internal Medicine

## 2015-05-03 ENCOUNTER — Ambulatory Visit (INDEPENDENT_AMBULATORY_CARE_PROVIDER_SITE_OTHER): Payer: Medicare Other | Admitting: Internal Medicine

## 2015-05-03 VITALS — BP 176/141 | HR 88 | Temp 98.2°F | Ht 72.0 in | Wt 274.0 lb

## 2015-05-03 DIAGNOSIS — T8130XS Disruption of wound, unspecified, sequela: Secondary | ICD-10-CM

## 2015-05-03 DIAGNOSIS — T847XXS Infection and inflammatory reaction due to other internal orthopedic prosthetic devices, implants and grafts, sequela: Secondary | ICD-10-CM | POA: Diagnosis not present

## 2015-05-03 DIAGNOSIS — T84610D Infection and inflammatory reaction due to internal fixation device of right humerus, subsequent encounter: Secondary | ICD-10-CM | POA: Diagnosis not present

## 2015-05-03 DIAGNOSIS — K219 Gastro-esophageal reflux disease without esophagitis: Secondary | ICD-10-CM | POA: Diagnosis not present

## 2015-05-03 DIAGNOSIS — B958 Unspecified staphylococcus as the cause of diseases classified elsewhere: Secondary | ICD-10-CM

## 2015-05-03 DIAGNOSIS — S42202K Unspecified fracture of upper end of left humerus, subsequent encounter for fracture with nonunion: Secondary | ICD-10-CM | POA: Diagnosis not present

## 2015-05-03 DIAGNOSIS — F419 Anxiety disorder, unspecified: Secondary | ICD-10-CM | POA: Diagnosis not present

## 2015-05-03 DIAGNOSIS — I10 Essential (primary) hypertension: Secondary | ICD-10-CM | POA: Diagnosis not present

## 2015-05-03 DIAGNOSIS — Z452 Encounter for adjustment and management of vascular access device: Secondary | ICD-10-CM | POA: Diagnosis not present

## 2015-05-03 LAB — CBC WITH DIFFERENTIAL/PLATELET
BASOS ABS: 0.1 10*3/uL (ref 0.0–0.1)
BASOS PCT: 1 % (ref 0–1)
EOS ABS: 0.3 10*3/uL (ref 0.0–0.7)
EOS PCT: 4 % (ref 0–5)
HCT: 37.5 % — ABNORMAL LOW (ref 39.0–52.0)
Hemoglobin: 12.6 g/dL — ABNORMAL LOW (ref 13.0–17.0)
Lymphocytes Relative: 15 % (ref 12–46)
Lymphs Abs: 0.9 10*3/uL (ref 0.7–4.0)
MCH: 31.7 pg (ref 26.0–34.0)
MCHC: 33.6 g/dL (ref 30.0–36.0)
MCV: 94.2 fL (ref 78.0–100.0)
MPV: 9.3 fL (ref 8.6–12.4)
Monocytes Absolute: 0.4 10*3/uL (ref 0.1–1.0)
Monocytes Relative: 6 % (ref 3–12)
NEUTROS PCT: 74 % (ref 43–77)
Neutro Abs: 4.7 10*3/uL (ref 1.7–7.7)
Platelets: 224 10*3/uL (ref 150–400)
RBC: 3.98 MIL/uL — AB (ref 4.22–5.81)
RDW: 14.8 % (ref 11.5–15.5)
WBC: 6.3 10*3/uL (ref 4.0–10.5)

## 2015-05-03 NOTE — Progress Notes (Signed)
Subjective:    Patient ID: Tanner Garza, male    DOB: 1945/09/03, 69 y.o.   MRN: [id number]  HPI 69yo M with CoNS ...noticing more drainage 69yo man with PMHx of HTN, OSA, hyperlipidemia, who suffered closed transverse fracture, distal radius fracture and left index finger DIP dislocation from falling off a ladder. He is s/p ORIF with left humeral shaft with plate placement on 6/38. He noticed poor healing and warmth, erythema to surgical incision. He was admitted on 10/4 for irrigation and debridement of his left humerus due to concern for infection with removal of the plate and screws. OR note reports a large amount of "murky fluid" was noted at the nonunion site. Tissue and wound cultures grew CoNS (oxacillin S). He was initially discharged on vancomycin but switched to cephalosporin when sensitivity returned. The patient states that since switching to cefazolin he noticed increasing serous drainage from his arm, and slight swelling of hand.  Allergies  Allergen Reactions  . Atenolol     bradycardia  . Chantix [Varenicline]     Sedation   . Hctz [Hydrochlorothiazide]     Bradycardia   . Other     bandaid -rash   Current Outpatient Prescriptions on File Prior to Visit  Medication Sig Dispense Refill  . ALPRAZolam (XANAX) 0.5 MG tablet Take 0.5 mg by mouth every morning.     Marland Kitchen amLODipine (NORVASC) 5 MG tablet Take 5 mg by mouth every morning.     . Ascorbic Acid (VITAMIN C) 500 MG CHEW Chew 1 tablet (500 mg total) by mouth 2 (two) times daily. 84 tablet 0  . aspirin 81 MG tablet Take 81 mg by mouth every evening.    . dutasteride (AVODART) 0.5 MG capsule Take 0.5 mg by mouth daily with supper.     . gabapentin (NEURONTIN) 300 MG capsule Take 300 mg by mouth daily with supper.     . losartan (COZAAR) 100 MG tablet Take 100 mg by mouth daily with supper.    . Lutein 6 MG TABS Take 6 mg by mouth daily with supper.     . Multiple Vitamin (MULTIVITAMIN) tablet Take 1 tablet by mouth  every morning.     Donell Sievert IN Inhale into the lungs. CPAP    . pantoprazole (PROTONIX) 40 MG tablet Take 40 mg by mouth daily with supper.     . rifampin (RIFADIN) 300 MG capsule Take 1 capsule (300 mg total) by mouth 2 (two) times daily. 84 capsule 0  . spironolactone (ALDACTONE) 25 MG tablet Take 25 mg by mouth every morning.     . vancomycin 1,250 mg in sodium chloride 0.9 % 250 mL Inject 1,250 mg into the vein every 12 (twelve) hours. 1250 mg 84  . zinc sulfate 220 MG capsule Take 1 capsule (220 mg total) by mouth daily. 42 capsule 0   No current facility-administered medications on file prior to visit.   Active Ambulatory Problems    Diagnosis Date Noted  . Closed fracture of left humerus 12/09/2014  . Fracture of left distal radius 12/09/2014  . Laceration of middle finger of left hand without complication 75/64/3329  . Humerus fracture 12/09/2014  . Staphylococcal infection   . Hardware complicating wound infection [facility])    Resolved Ambulatory Problems    Diagnosis Date Noted  . No Resolved Ambulatory Problems   Past Medical History  Diagnosis Date  . Hypertension   . BPH (benign prostatic hyperplasia)   . Anxiety   .  Esophageal reflux   . Bell's palsy 09/2001  . Hyperlipidemia   . Eczema   . Colon polyp   . OSA (obstructive sleep apnea)   . SVT (supraventricular tachycardia) ([place])   . Complication of anesthesia   . Dysrhythmia    Social History  Substance Use Topics  . Smoking status: Current Every Day Smoker -- 0.50 packs/day for 45 years    Types: Cigarettes  . Smokeless tobacco: Never Used  . Alcohol Use: No  family history is not on file.     Review of Systems He denies any fevers or chills other than the one day with low grade fever. No diarrhea. No rash. Comprehensive reviews is normal    Objective:   Physical Exam  BP 176/141 mmHg  Pulse 88  Temp(Src) 98.2 F (36.8 C) (Oral)  Ht 6' (1.829 m)  Wt 274 lb (124.286 kg)  BMI 37.15  kg/m2 Physical Exam  Constitutional: He is oriented to person, place, and time. He appears well-developed and well-nourished. No distress.  HENT:  Mouth/Throat: Oropharynx is clear and moist. No oropharyngeal exudate.  Ext: external fixator to proximal upper extremity, wrapped. Neurological: He is alert and oriented to person, place, and time.  Skin: Skin is warm and dry. No rash noted. No erythema.  Psychiatric: He has a normal mood and affect. His behavior is normal.    Lab Results  Component Value Date   ESRSEDRATE 55* 04/22/2015   Lab Results  Component Value Date   CRP 1.0* 04/22/2015   Micro: CoNS (oxacillin S_     Assessment & Plan:  - hw infection with CoNS = concern that he is having more drainage. Will check cbc with diff, bmp, sed rate and crp. Based on these labs may need to change back to vancomycin  Will see him back in 2 wk at roughly 4 wk of abtx

## 2015-05-04 DIAGNOSIS — I10 Essential (primary) hypertension: Secondary | ICD-10-CM | POA: Diagnosis not present

## 2015-05-04 DIAGNOSIS — E785 Hyperlipidemia, unspecified: Secondary | ICD-10-CM | POA: Diagnosis not present

## 2015-05-04 DIAGNOSIS — F419 Anxiety disorder, unspecified: Secondary | ICD-10-CM | POA: Diagnosis not present

## 2015-05-04 DIAGNOSIS — F17200 Nicotine dependence, unspecified, uncomplicated: Secondary | ICD-10-CM | POA: Diagnosis not present

## 2015-05-04 DIAGNOSIS — Z125 Encounter for screening for malignant neoplasm of prostate: Secondary | ICD-10-CM | POA: Diagnosis not present

## 2015-05-04 DIAGNOSIS — Z23 Encounter for immunization: Secondary | ICD-10-CM | POA: Diagnosis not present

## 2015-05-04 DIAGNOSIS — G4733 Obstructive sleep apnea (adult) (pediatric): Secondary | ICD-10-CM | POA: Diagnosis not present

## 2015-05-04 DIAGNOSIS — K219 Gastro-esophageal reflux disease without esophagitis: Secondary | ICD-10-CM | POA: Diagnosis not present

## 2015-05-04 DIAGNOSIS — Z0000 Encounter for general adult medical examination without abnormal findings: Secondary | ICD-10-CM | POA: Diagnosis not present

## 2015-05-04 LAB — SEDIMENTATION RATE: SED RATE: 32 mm/h — AB (ref 0–20)

## 2015-05-04 LAB — BASIC METABOLIC PANEL
BUN: 14 mg/dL (ref 7–25)
CHLORIDE: 102 mmol/L (ref 98–110)
CO2: 24 mmol/L (ref 20–31)
Calcium: 9.1 mg/dL (ref 8.6–10.3)
Creat: 0.71 mg/dL (ref 0.70–1.25)
Glucose, Bld: 101 mg/dL — ABNORMAL HIGH (ref 65–99)
Potassium: 4 mmol/L (ref 3.5–5.3)
SODIUM: 139 mmol/L (ref 135–146)

## 2015-05-04 LAB — C-REACTIVE PROTEIN: CRP: 0.6 mg/dL — ABNORMAL HIGH (ref <0.60)

## 2015-05-05 ENCOUNTER — Telehealth: Payer: Self-pay | Admitting: *Deleted

## 2015-05-05 NOTE — Telephone Encounter (Signed)
i left a voicemail on their home number/one listed in epic. Can you call back and let him know that We will continue with using cefazolin 2gm IV Q 8hr. His labs look improved from when he was hospitalized. c

## 2015-05-05 NOTE — Telephone Encounter (Signed)
[facility] called to make sure to continue the patient medications as they tried to set up delivery and the patient advised them that he was waiting on lab results from 05/03/15. Advised her will send Dr Baxter Flattery a message and see if she is changing medications or continuing his current therapy.

## 2015-05-06 NOTE — Telephone Encounter (Addendum)
Verbal order per Dr. Baxter Flattery given to [facility] [place] to extend patient's IV antibiotics (cefazolin 2 gm Q 8 hours) through 05/19/15. Tanner Garza

## 2015-05-07 DIAGNOSIS — I10 Essential (primary) hypertension: Secondary | ICD-10-CM | POA: Diagnosis not present

## 2015-05-07 DIAGNOSIS — K219 Gastro-esophageal reflux disease without esophagitis: Secondary | ICD-10-CM | POA: Diagnosis not present

## 2015-05-07 DIAGNOSIS — S42202K Unspecified fracture of upper end of left humerus, subsequent encounter for fracture with nonunion: Secondary | ICD-10-CM | POA: Diagnosis not present

## 2015-05-07 DIAGNOSIS — F419 Anxiety disorder, unspecified: Secondary | ICD-10-CM | POA: Diagnosis not present

## 2015-05-07 DIAGNOSIS — T84610D Infection and inflammatory reaction due to internal fixation device of right humerus, subsequent encounter: Secondary | ICD-10-CM | POA: Diagnosis not present

## 2015-05-07 DIAGNOSIS — Z452 Encounter for adjustment and management of vascular access device: Secondary | ICD-10-CM | POA: Diagnosis not present

## 2015-05-10 DIAGNOSIS — I10 Essential (primary) hypertension: Secondary | ICD-10-CM | POA: Diagnosis not present

## 2015-05-10 DIAGNOSIS — T84610D Infection and inflammatory reaction due to internal fixation device of right humerus, subsequent encounter: Secondary | ICD-10-CM | POA: Diagnosis not present

## 2015-05-10 DIAGNOSIS — K219 Gastro-esophageal reflux disease without esophagitis: Secondary | ICD-10-CM | POA: Diagnosis not present

## 2015-05-10 DIAGNOSIS — S42202K Unspecified fracture of upper end of left humerus, subsequent encounter for fracture with nonunion: Secondary | ICD-10-CM | POA: Diagnosis not present

## 2015-05-10 DIAGNOSIS — R7881 Bacteremia: Secondary | ICD-10-CM | POA: Diagnosis not present

## 2015-05-10 DIAGNOSIS — F419 Anxiety disorder, unspecified: Secondary | ICD-10-CM | POA: Diagnosis not present

## 2015-05-10 DIAGNOSIS — Z452 Encounter for adjustment and management of vascular access device: Secondary | ICD-10-CM | POA: Diagnosis not present

## 2015-05-13 ENCOUNTER — Telehealth: Payer: Self-pay | Admitting: *Deleted

## 2015-05-13 ENCOUNTER — Ambulatory Visit (INDEPENDENT_AMBULATORY_CARE_PROVIDER_SITE_OTHER): Payer: Medicare Other | Admitting: Internal Medicine

## 2015-05-13 VITALS — Wt 271.0 lb

## 2015-05-13 DIAGNOSIS — T847XXS Infection and inflammatory reaction due to other internal orthopedic prosthetic devices, implants and grafts, sequela: Secondary | ICD-10-CM

## 2015-05-13 DIAGNOSIS — B958 Unspecified staphylococcus as the cause of diseases classified elsewhere: Secondary | ICD-10-CM | POA: Diagnosis not present

## 2015-05-13 NOTE — Telephone Encounter (Signed)
Per verbal from Dr Baxter Flattery called [facility] and changed the patient medication from Ancef to Vanc and gave a new stop date of 06/02/15. Advised the patient has an appt for 06/01/15 and we will call with any changes.

## 2015-05-13 NOTE — Progress Notes (Signed)
Patient ID: Tanner Garza, male   DOB: 04/12/1946, 69 y.o.   MRN: [id number]     RFV: closed fracture s/p HW infection  Patient ID: Tanner Garza, male   DOB: 1946/02/12, 69 y.o.   MRN: [id number]  HPI 69yo man with PMHx of HTN, OSA, hyperlipidemia, who suffered closed transverse fracture, distal radius fracture and left index finger DIP dislocation from falling off a ladder. He is s/p ORIF with left humeral shaft with plate placement on 3/82. He noticed poor healing and warmth, erythema to surgical incision. He was admitted on 10/4 for irrigation and debridement of his left humerus due to concern for infection with removal of the plate and screws. OR note reports a large amount of "murky fluid" was noted at the nonunion site. Tissue and wound cultures grew CoNS (oxacillin S).  he has been on cefazolin 2gm IV Q8hr for the past 3 weeks. Recent labs show sed rate of 54 and crp 6.0. Concerning that his inflammatory markers worse than last visit. The patient states that he feels that he has some swelling of his distal arm but not worsened of late. No redness of insertion sites of external fixator or purulent drainage from bandages.   Outpatient Encounter Prescriptions as of 05/13/2015  Medication Sig  . ALPRAZolam (XANAX) 0.5 MG tablet Take 0.5 mg by mouth every morning.   Marland Kitchen amLODipine (NORVASC) 5 MG tablet Take 5 mg by mouth every morning.   . Ascorbic Acid (VITAMIN C) 500 MG CHEW Chew 1 tablet (500 mg total) by mouth 2 (two) times daily.  Marland Kitchen aspirin 81 MG tablet Take 81 mg by mouth every evening.  . dutasteride (AVODART) 0.5 MG capsule Take 0.5 mg by mouth daily with supper.   . gabapentin (NEURONTIN) 300 MG capsule Take 300 mg by mouth daily with supper.   . losartan (COZAAR) 100 MG tablet Take 100 mg by mouth daily with supper.  . Lutein 6 MG TABS Take 6 mg by mouth daily with supper.   . Multiple Vitamin (MULTIVITAMIN) tablet Take 1 tablet by mouth every morning.   Donell Sievert IN Inhale  into the lungs. CPAP  . pantoprazole (PROTONIX) 40 MG tablet Take 40 mg by mouth daily with supper.   . rifampin (RIFADIN) 300 MG capsule Take 1 capsule (300 mg total) by mouth 2 (two) times daily.  Marland Kitchen spironolactone (ALDACTONE) 25 MG tablet Take 25 mg by mouth every morning.   . vancomycin 1,250 mg in sodium chloride 0.9 % 250 mL Inject 1,250 mg into the vein every 12 (twelve) hours.  Marland Kitchen zinc sulfate 220 MG capsule Take 1 capsule (220 mg total) by mouth daily.   No facility-administered encounter medications on file as of 05/13/2015.     Patient Active Problem List   Diagnosis Date Noted  . Staphylococcal infection   . Hardware complicating wound infection ([place])   . Closed fracture of left humerus 12/09/2014  . Fracture of left distal radius 12/09/2014  . Laceration of middle finger of left hand without complication 50/53/9767  . Humerus fracture 12/09/2014     Health Maintenance Due  Topic Date Due  . TETANUS/TDAP  10/06/1965  . COLONOSCOPY  10/06/1996  . ZOSTAVAX  10/07/2006  . PNA vac Low Risk Adult (1 of 2 - PCV13) 10/07/2011  . INFLUENZA VACCINE  02/15/2015     Review of Systems 10 point ROS is negative.  Physical Exam  Wt 271 lb (122.925 kg) Physical Exam  Constitutional: He is  oriented to person, place, and time. He appears well-developed and well-nourished. No distress.  HENT:  Mouth/Throat: Oropharynx is clear and moist. No oropharyngeal exudate.  Cardiovascular: Normal rate, regular rhythm and normal heart sounds. Exam reveals no gallop and no friction rub.  No murmur heard.  Pulmonary/Chest: Effort normal and breath sounds normal. No respiratory distress. He has no wheezes.  Ext: left arm ext fixator in place Psychiatric: He has a normal mood and affect. His behavior is normal.     CBC Lab Results  Component Value Date   WBC 6.3 05/03/2015   RBC 3.98* 05/03/2015   HGB 12.6* 05/03/2015   HCT 37.5* 05/03/2015   PLT 224 05/03/2015   MCV 94.2 05/03/2015    MCH 31.7 05/03/2015   MCHC 33.6 05/03/2015   RDW 14.8 05/03/2015   LYMPHSABS 0.9 05/03/2015   MONOABS 0.4 05/03/2015   EOSABS 0.3 05/03/2015   BASOSABS 0.1 05/03/2015   BMET Lab Results  Component Value Date   NA 139 05/03/2015   K 4.0 05/03/2015   CL 102 05/03/2015   CO2 24 05/03/2015   GLUCOSE 101* 05/03/2015   BUN 14 05/03/2015   CREATININE 0.71 05/03/2015   CALCIUM 9.1 05/03/2015   GFRNONAA >60 04/23/2015   GFRAA >60 04/23/2015     Assessment and Plan  CoNS hw infection = concern for mixed infection of CoNS. we will switch vancomycin to finish out total of 6 wk, currently on 23/42 days. Will give 20 days of vanco. Will need weekly vanco trough of 15-20.  Will call orthopedics for update.

## 2015-05-17 DIAGNOSIS — I10 Essential (primary) hypertension: Secondary | ICD-10-CM | POA: Diagnosis not present

## 2015-05-17 DIAGNOSIS — K219 Gastro-esophageal reflux disease without esophagitis: Secondary | ICD-10-CM | POA: Diagnosis not present

## 2015-05-17 DIAGNOSIS — S42202K Unspecified fracture of upper end of left humerus, subsequent encounter for fracture with nonunion: Secondary | ICD-10-CM | POA: Diagnosis not present

## 2015-05-17 DIAGNOSIS — T84610D Infection and inflammatory reaction due to internal fixation device of right humerus, subsequent encounter: Secondary | ICD-10-CM | POA: Diagnosis not present

## 2015-05-17 DIAGNOSIS — R7881 Bacteremia: Secondary | ICD-10-CM | POA: Diagnosis not present

## 2015-05-17 DIAGNOSIS — Z452 Encounter for adjustment and management of vascular access device: Secondary | ICD-10-CM | POA: Diagnosis not present

## 2015-05-17 DIAGNOSIS — F419 Anxiety disorder, unspecified: Secondary | ICD-10-CM | POA: Diagnosis not present

## 2015-05-20 ENCOUNTER — Inpatient Hospital Stay: Payer: Medicare Other | Admitting: Internal Medicine

## 2015-05-21 DIAGNOSIS — Z79899 Other long term (current) drug therapy: Secondary | ICD-10-CM | POA: Diagnosis not present

## 2015-05-21 DIAGNOSIS — D539 Nutritional anemia, unspecified: Secondary | ICD-10-CM | POA: Diagnosis not present

## 2015-05-21 DIAGNOSIS — D649 Anemia, unspecified: Secondary | ICD-10-CM | POA: Diagnosis not present

## 2015-05-24 DIAGNOSIS — I10 Essential (primary) hypertension: Secondary | ICD-10-CM | POA: Diagnosis not present

## 2015-05-24 DIAGNOSIS — T84610D Infection and inflammatory reaction due to internal fixation device of right humerus, subsequent encounter: Secondary | ICD-10-CM | POA: Diagnosis not present

## 2015-05-24 DIAGNOSIS — F419 Anxiety disorder, unspecified: Secondary | ICD-10-CM | POA: Diagnosis not present

## 2015-05-24 DIAGNOSIS — S42202K Unspecified fracture of upper end of left humerus, subsequent encounter for fracture with nonunion: Secondary | ICD-10-CM | POA: Diagnosis not present

## 2015-05-24 DIAGNOSIS — Z452 Encounter for adjustment and management of vascular access device: Secondary | ICD-10-CM | POA: Diagnosis not present

## 2015-05-24 DIAGNOSIS — K219 Gastro-esophageal reflux disease without esophagitis: Secondary | ICD-10-CM | POA: Diagnosis not present

## 2015-05-25 DIAGNOSIS — K219 Gastro-esophageal reflux disease without esophagitis: Secondary | ICD-10-CM | POA: Diagnosis not present

## 2015-05-25 DIAGNOSIS — F419 Anxiety disorder, unspecified: Secondary | ICD-10-CM | POA: Diagnosis not present

## 2015-05-25 DIAGNOSIS — S42202K Unspecified fracture of upper end of left humerus, subsequent encounter for fracture with nonunion: Secondary | ICD-10-CM | POA: Diagnosis not present

## 2015-05-25 DIAGNOSIS — Z452 Encounter for adjustment and management of vascular access device: Secondary | ICD-10-CM | POA: Diagnosis not present

## 2015-05-25 DIAGNOSIS — T84610D Infection and inflammatory reaction due to internal fixation device of right humerus, subsequent encounter: Secondary | ICD-10-CM | POA: Diagnosis not present

## 2015-05-25 DIAGNOSIS — R7881 Bacteremia: Secondary | ICD-10-CM | POA: Diagnosis not present

## 2015-05-25 DIAGNOSIS — I10 Essential (primary) hypertension: Secondary | ICD-10-CM | POA: Diagnosis not present

## 2015-05-31 DIAGNOSIS — F419 Anxiety disorder, unspecified: Secondary | ICD-10-CM | POA: Diagnosis not present

## 2015-05-31 DIAGNOSIS — S42202K Unspecified fracture of upper end of left humerus, subsequent encounter for fracture with nonunion: Secondary | ICD-10-CM | POA: Diagnosis not present

## 2015-05-31 DIAGNOSIS — Z452 Encounter for adjustment and management of vascular access device: Secondary | ICD-10-CM | POA: Diagnosis not present

## 2015-05-31 DIAGNOSIS — I10 Essential (primary) hypertension: Secondary | ICD-10-CM | POA: Diagnosis not present

## 2015-05-31 DIAGNOSIS — K219 Gastro-esophageal reflux disease without esophagitis: Secondary | ICD-10-CM | POA: Diagnosis not present

## 2015-05-31 DIAGNOSIS — R7881 Bacteremia: Secondary | ICD-10-CM | POA: Diagnosis not present

## 2015-05-31 DIAGNOSIS — T84610D Infection and inflammatory reaction due to internal fixation device of right humerus, subsequent encounter: Secondary | ICD-10-CM | POA: Diagnosis not present

## 2015-06-01 ENCOUNTER — Ambulatory Visit (INDEPENDENT_AMBULATORY_CARE_PROVIDER_SITE_OTHER): Payer: Medicare Other | Admitting: Internal Medicine

## 2015-06-01 ENCOUNTER — Encounter: Payer: Self-pay | Admitting: Internal Medicine

## 2015-06-01 VITALS — BP 178/128 | HR 103 | Temp 98.8°F | Wt 273.0 lb

## 2015-06-01 DIAGNOSIS — B958 Unspecified staphylococcus as the cause of diseases classified elsewhere: Secondary | ICD-10-CM | POA: Diagnosis present

## 2015-06-01 DIAGNOSIS — T847XXS Infection and inflammatory reaction due to other internal orthopedic prosthetic devices, implants and grafts, sequela: Secondary | ICD-10-CM | POA: Diagnosis not present

## 2015-06-02 NOTE — Progress Notes (Signed)
RFV: follow up for hw infection to left humeral shaft Subjective:    Patient ID: Tanner Garza, male    DOB: [birth year], 69 y.o.   MRN: [id number]  HPI  69yo man with PMHx of HTN, OSA, hyperlipidemia, who suffered closed transverse fracture, distal radius fracture and left index finger DIP dislocation from falling off a ladder. He is s/p ORIF with left humeral shaft with plate placement on [id number]. He noticed poor healing and warmth, erythema to surgical incision. He was admitted on 10/4 for irrigation and debridement of his left humerus due to concern for infection with removal of the plate and screws. OR note reports a large amount of "murky fluid" was noted at the nonunion site. Tissue and wound cultures grew CoNS (oxacillin S).  he had been on cefazolin 2gm IV Q8hr x 3 weeks. Due to worsening inflammatory markers, he was switched to IV vancomycin plus rifampin to finish out the last 3 of 6 wk course of therapy. The patient denies any drainage or redness of insertion sites of external fixator or purulent drainage from bandages.   Labs taken by advance yesterday but no results sent to office. He is due to finish antibiotics on Thursday.  Next plan for surgery is to remove external fixator.  Current Outpatient Prescriptions on File Prior to Visit  Medication Sig Dispense Refill  . ALPRAZolam (XANAX) 0.5 MG tablet Take 0.5 mg by mouth every morning.     Marland Kitchen amLODipine (NORVASC) 5 MG tablet Take 5 mg by mouth every morning.     . Ascorbic Acid (VITAMIN C) 500 MG CHEW Chew 1 tablet (500 mg total) by mouth 2 (two) times daily. 84 tablet 0  . aspirin 81 MG tablet Take 81 mg by mouth every evening.    . dutasteride (AVODART) 0.5 MG capsule Take 0.5 mg by mouth daily with supper.     . gabapentin (NEURONTIN) 300 MG capsule Take 300 mg by mouth daily with supper.     . losartan (COZAAR) 100 MG tablet Take 100 mg by mouth daily with supper.    . Lutein 6 MG TABS Take 6 mg by mouth daily with supper.      . Multiple Vitamin (MULTIVITAMIN) tablet Take 1 tablet by mouth every morning.     Donell Sievert IN Inhale into the lungs. CPAP    . pantoprazole (PROTONIX) 40 MG tablet Take 40 mg by mouth daily with supper.     . rifampin (RIFADIN) 300 MG capsule Take 1 capsule (300 mg total) by mouth 2 (two) times daily. 84 capsule 0  . spironolactone (ALDACTONE) 25 MG tablet Take 25 mg by mouth every morning.     . vancomycin 1,250 mg in sodium chloride 0.9 % 250 mL Inject 1,250 mg into the vein every 12 (twelve) hours. 1250 mg 84  . zinc sulfate 220 MG capsule Take 1 capsule (220 mg total) by mouth daily. 42 capsule 0   No current facility-administered medications on file prior to visit.   Active Ambulatory Problems    Diagnosis Date Noted  . Closed fracture of left humerus 12/09/2014  . Fracture of left distal radius 12/09/2014  . Laceration of middle finger of left hand without complication [id number]  . Humerus fracture 12/09/2014  . Staphylococcal infection   . Hardware complicating wound infection [facility])    Resolved Ambulatory Problems    Diagnosis Date Noted  . No Resolved Ambulatory Problems   Past Medical History  Diagnosis Date  . Hypertension   .  BPH (benign prostatic hyperplasia)   . Anxiety   . Esophageal reflux   . Bell's palsy 09/2001  . Hyperlipidemia   . Eczema   . Colon polyp   . OSA (obstructive sleep apnea)   . SVT (supraventricular tachycardia) ([place])   . Complication of anesthesia   . Dysrhythmia    Social History  Substance Use Topics  . Smoking status: Current Every Day Smoker -- 0.50 packs/day for 45 years    Types: Cigarettes  . Smokeless tobacco: Never Used  . Alcohol Use: No     Review of Systems No fever, chills, nightsweats No drainage from left arm No diarrhea No thrush No rash    Objective:   Physical Exam BP 178/128 mmHg  Pulse 103  Temp(Src) 98.8 F (37.1 C) (Oral)  Wt 273 lb (123.832 kg) Physical Exam  Constitutional: He is  oriented to person, place, and time. He appears well-developed and well-nourished. No distress.  HENT:  Mouth/Throat: Oropharynx is clear and moist. No oropharyngeal exudate.  Cardiovascular: Normal rate, regular rhythm and normal heart sounds. Exam reveals no gallop and no friction rub.  No murmur heard.  Pulmonary/Chest: Effort normal and breath sounds normal. No respiratory distress. He has no wheezes.  Ext: left arm is in sling protecting his external fixator. No erythema at insertion, no drainage, trace edema to upper extremity Skin: Skin is warm and dry. No rash noted. No erythema.  Psychiatric: He has a normal mood and affect. His behavior is normal.   Lab Results  Component Value Date   ESRSEDRATE 32* 05/03/2015   Lab Results  Component Value Date   CRP 0.6* 05/03/2015       Assessment & Plan:  CoNS Hardware infection from recent ORIF s/p HW removal now finishing 6 wk of IV antibiotics. Inflammatory markers 3 wk ago still elevated but improved. Plan to convert to oral antibiotics x 4 wk if inflammatory markers still not back to baseline/or improved. Plan to give doxycycline 100mg  BID and continue rifampin as long as he has external fixator in place.  We will need to discuss with surgery their next plans for the patient

## 2015-06-03 ENCOUNTER — Other Ambulatory Visit: Payer: Self-pay | Admitting: *Deleted

## 2015-06-03 ENCOUNTER — Other Ambulatory Visit: Payer: Self-pay | Admitting: Internal Medicine

## 2015-06-03 DIAGNOSIS — T847XXD Infection and inflammatory reaction due to other internal orthopedic prosthetic devices, implants and grafts, subsequent encounter: Secondary | ICD-10-CM

## 2015-06-03 MED ORDER — ONDANSETRON 4 MG PO TBDP: 4.0000 mg | ORAL_TABLET | Freq: Three times a day (TID) | ORAL | Status: DC | PRN

## 2015-06-03 MED ORDER — RIFAMPIN 300 MG PO CAPS: 300.0000 mg | ORAL_CAPSULE | Freq: Two times a day (BID) | ORAL | Status: DC

## 2015-06-03 MED ORDER — DOXYCYCLINE HYCLATE 100 MG PO TABS: 100.0000 mg | ORAL_TABLET | Freq: Two times a day (BID) | ORAL | Status: DC

## 2015-06-04 DIAGNOSIS — F419 Anxiety disorder, unspecified: Secondary | ICD-10-CM | POA: Diagnosis not present

## 2015-06-04 DIAGNOSIS — T84610D Infection and inflammatory reaction due to internal fixation device of right humerus, subsequent encounter: Secondary | ICD-10-CM | POA: Diagnosis not present

## 2015-06-04 DIAGNOSIS — S42202K Unspecified fracture of upper end of left humerus, subsequent encounter for fracture with nonunion: Secondary | ICD-10-CM | POA: Diagnosis not present

## 2015-06-04 DIAGNOSIS — I10 Essential (primary) hypertension: Secondary | ICD-10-CM | POA: Diagnosis not present

## 2015-06-04 DIAGNOSIS — Z452 Encounter for adjustment and management of vascular access device: Secondary | ICD-10-CM | POA: Diagnosis not present

## 2015-06-04 DIAGNOSIS — K219 Gastro-esophageal reflux disease without esophagitis: Secondary | ICD-10-CM | POA: Diagnosis not present

## 2015-06-14 DIAGNOSIS — S42302K Unspecified fracture of shaft of humerus, left arm, subsequent encounter for fracture with nonunion: Secondary | ICD-10-CM | POA: Diagnosis not present

## 2015-06-21 DIAGNOSIS — M25612 Stiffness of left shoulder, not elsewhere classified: Secondary | ICD-10-CM | POA: Diagnosis not present

## 2015-06-21 DIAGNOSIS — M25512 Pain in left shoulder: Secondary | ICD-10-CM | POA: Diagnosis not present

## 2015-06-28 ENCOUNTER — Encounter: Payer: Self-pay | Admitting: Internal Medicine

## 2015-06-28 ENCOUNTER — Ambulatory Visit (INDEPENDENT_AMBULATORY_CARE_PROVIDER_SITE_OTHER): Payer: Medicare Other | Admitting: Internal Medicine

## 2015-06-28 VITALS — BP 170/117 | HR 86 | Temp 98.8°F | Wt 271.0 lb

## 2015-06-28 DIAGNOSIS — T847XXS Infection and inflammatory reaction due to other internal orthopedic prosthetic devices, implants and grafts, sequela: Secondary | ICD-10-CM | POA: Diagnosis not present

## 2015-06-28 DIAGNOSIS — B957 Other staphylococcus as the cause of diseases classified elsewhere: Secondary | ICD-10-CM | POA: Diagnosis not present

## 2015-06-28 LAB — BASIC METABOLIC PANEL
BUN: 17 mg/dL (ref 7–25)
CALCIUM: 8.8 mg/dL (ref 8.6–10.3)
CHLORIDE: 103 mmol/L (ref 98–110)
CO2: 25 mmol/L (ref 20–31)
CREATININE: 0.59 mg/dL — AB (ref 0.70–1.25)
Glucose, Bld: 117 mg/dL — ABNORMAL HIGH (ref 65–99)
Potassium: 3.7 mmol/L (ref 3.5–5.3)
Sodium: 138 mmol/L (ref 135–146)

## 2015-06-28 LAB — SEDIMENTATION RATE: Sed Rate: 17 mm/hr (ref 0–20)

## 2015-06-28 LAB — C-REACTIVE PROTEIN: CRP: <0.5 mg/dL (ref <0.60)

## 2015-06-28 NOTE — Progress Notes (Signed)
RFV: follow up on CoNS HW infection to left arm Subjective:    Patient ID: Tanner Garza, male    DOB: 1945/09/04, 69 y.o.   MRN: [id number]  HPI  Tanner Garza is a 69yo M who originally sustained humerus and distal radius fracture from ground level fall. He is s/p ORIF, with HW placement but susbequently had warmth, erythema and drainage to his humerus fracture. Distal radius was unaffected. He had I x D with HW removal of humerus and stabilized the fracture with external fixator. OR cultures only grew CoNS. He has finished 6 wk of IV therapy of vancomycin plus rifampin then in the last 4 wk transitioned to oral doxycycline plus rifampin. He is looking forward to having his external fixator finished. His last visit at trauma doctor roughly 2 weeks ago was reassuring for growth of bone. The plan is to continue with external fixator.  Roughly 3 days ago, he did have uri symptoms, feeling poorly, but now improved. Otherwise 10 point ros is negative  Current outpatient prescriptions:  .  ALPRAZolam (XANAX) 0.5 MG tablet, Take 0.5 mg by mouth every morning. , Disp: , Rfl:  .  amLODipine (NORVASC) 5 MG tablet, Take 5 mg by mouth every morning. , Disp: , Rfl:  .  Ascorbic Acid (VITAMIN C) 500 MG CHEW, Chew 1 tablet (500 mg total) by mouth 2 (two) times daily., Disp: 84 tablet, Rfl: 0 .  aspirin 81 MG tablet, Take 81 mg by mouth every evening., Disp: , Rfl:  .  doxycycline (VIBRA-TABS) 100 MG tablet, Take 1 tablet (100 mg total) by mouth 2 (two) times daily., Disp: 60 tablet, Rfl: 0 .  dutasteride (AVODART) 0.5 MG capsule, Take 0.5 mg by mouth daily with supper. , Disp: , Rfl:  .  gabapentin (NEURONTIN) 300 MG capsule, Take 300 mg by mouth daily with supper. , Disp: , Rfl:  .  losartan (COZAAR) 100 MG tablet, Take 100 mg by mouth daily with supper., Disp: , Rfl:  .  Lutein 6 MG TABS, Take 6 mg by mouth daily with supper. , Disp: , Rfl:  .  Multiple Vitamin (MULTIVITAMIN) tablet, Take 1 tablet by mouth  every morning. , Disp: , Rfl:  .  ondansetron (ZOFRAN ODT) 4 MG disintegrating tablet, Take 1 tablet (4 mg total) by mouth every 8 (eight) hours as needed for nausea or vomiting., Disp: 30 tablet, Rfl: 2 .  pantoprazole (PROTONIX) 40 MG tablet, Take 40 mg by mouth daily with supper. , Disp: , Rfl:  .  rifampin (RIFADIN) 300 MG capsule, Take 1 capsule (300 mg total) by mouth 2 (two) times daily., Disp: 60 capsule, Rfl: 0 .  spironolactone (ALDACTONE) 25 MG tablet, Take 25 mg by mouth every morning. , Disp: , Rfl:  .  zinc sulfate 220 MG capsule, Take 1 capsule (220 mg total) by mouth daily., Disp: 42 capsule, Rfl: 0 .  OXYGEN-HELIUM IN, Inhale into the lungs. CPAP, Disp: , Rfl:    Active Ambulatory Problems    Diagnosis Date Noted  . Closed fracture of left humerus 12/09/2014  . Fracture of left distal radius 12/09/2014  . Laceration of middle finger of left hand without complication [id number]  . Humerus fracture 12/09/2014  . Staphylococcal infection   . Hardware complicating wound infection [facility])    Resolved Ambulatory Problems    Diagnosis Date Noted  . No Resolved Ambulatory Problems   Past Medical History  Diagnosis Date  . Hypertension   . BPH (benign  prostatic hyperplasia)   . Anxiety   . Esophageal reflux   . Bell's palsy 09/2001  . Hyperlipidemia   . Eczema   . Colon polyp   . OSA (obstructive sleep apnea)   . SVT (supraventricular tachycardia) ([place])   . Complication of anesthesia   . Dysrhythmia    Review of Systems No diarrhea, n/v/or rash associated with antibiotics. No fever/chlls/nightsweats. 10 point ros is negative    Objective:   Physical Exam  BP 170/117 mmHg  Pulse 86  Temp(Src) 98.8 F (37.1 C) (Oral)  Wt 271 lb (122.925 kg) gen = a xo by 3 in nad Ext = left humerus with external fixator Skin = dry no erythema or drainage at external fixator Labs: Lab Results  Component Value Date   ESRSEDRATE 32* 05/03/2015   Lab Results  Component Value  Date   CRP 0.6* 05/03/2015        Assessment & Plan:  Hardware infection of left humerus = patient is on 10th week of abtx. We will check inflammatory markers to see how he is responding to tx. inflam markers may still be elevated given his hx of recent [place] Inches = improving. Appears self resolving  Await for result to determine if need to extend abtx.

## 2015-06-29 ENCOUNTER — Ambulatory Visit: Payer: Medicare Other | Admitting: Internal Medicine

## 2015-06-30 ENCOUNTER — Ambulatory Visit
Admission: RE | Admit: 2015-06-30 | Discharge: 2015-06-30 | Disposition: A | Discharge status: Home / Self Care | Payer: Medicare Other | Source: Ambulatory Visit | Attending: Orthopedic Surgery | Admitting: Orthopedic Surgery

## 2015-06-30 ENCOUNTER — Other Ambulatory Visit: Payer: Self-pay | Admitting: Orthopedic Surgery

## 2015-06-30 DIAGNOSIS — S42302A Unspecified fracture of shaft of humerus, left arm, initial encounter for closed fracture: Secondary | ICD-10-CM

## 2015-06-30 DIAGNOSIS — S42302K Unspecified fracture of shaft of humerus, left arm, subsequent encounter for fracture with nonunion: Secondary | ICD-10-CM | POA: Diagnosis not present

## 2015-06-30 DIAGNOSIS — S42392G Other fracture of shaft of left humerus, subsequent encounter for fracture with delayed healing: Secondary | ICD-10-CM | POA: Diagnosis not present

## 2015-07-22 ENCOUNTER — Encounter (HOSPITAL_COMMUNITY): Payer: Self-pay | Admitting: *Deleted

## 2015-07-22 NOTE — Progress Notes (Signed)
No changes in medical and surgical history per pt since he was seen here in October, 2016. Denies any chest pain or sob. States he does have sinus congestion, but no fever and nasal drainage is white to clear.

## 2015-07-23 ENCOUNTER — Encounter (HOSPITAL_COMMUNITY)
Admission: RE | Disposition: A | Discharge status: Home / Self Care | Payer: Self-pay | Source: Ambulatory Visit | Attending: Orthopedic Surgery

## 2015-07-23 ENCOUNTER — Encounter (HOSPITAL_COMMUNITY): Payer: Self-pay | Admitting: *Deleted

## 2015-07-23 ENCOUNTER — Ambulatory Visit (HOSPITAL_COMMUNITY): Payer: Medicare Other | Admitting: Anesthesiology

## 2015-07-23 ENCOUNTER — Ambulatory Visit (HOSPITAL_COMMUNITY): Payer: Medicare Other

## 2015-07-23 ENCOUNTER — Ambulatory Visit (HOSPITAL_COMMUNITY)
Admission: RE | Admit: 2015-07-23 | Discharge: 2015-07-23 | Disposition: A | Discharge status: Home / Self Care | Payer: Medicare Other | Source: Ambulatory Visit | Attending: Orthopedic Surgery | Admitting: Orthopedic Surgery

## 2015-07-23 DIAGNOSIS — E785 Hyperlipidemia, unspecified: Secondary | ICD-10-CM | POA: Diagnosis not present

## 2015-07-23 DIAGNOSIS — Z7982 Long term (current) use of aspirin: Secondary | ICD-10-CM | POA: Diagnosis not present

## 2015-07-23 DIAGNOSIS — K219 Gastro-esophageal reflux disease without esophagitis: Secondary | ICD-10-CM | POA: Diagnosis not present

## 2015-07-23 DIAGNOSIS — I10 Essential (primary) hypertension: Secondary | ICD-10-CM | POA: Diagnosis not present

## 2015-07-23 DIAGNOSIS — F17210 Nicotine dependence, cigarettes, uncomplicated: Secondary | ICD-10-CM | POA: Diagnosis not present

## 2015-07-23 DIAGNOSIS — X58XXXD Exposure to other specified factors, subsequent encounter: Secondary | ICD-10-CM | POA: Insufficient documentation

## 2015-07-23 DIAGNOSIS — I471 Supraventricular tachycardia: Secondary | ICD-10-CM | POA: Diagnosis not present

## 2015-07-23 DIAGNOSIS — Z6836 Body mass index (BMI) 36.0-36.9, adult: Secondary | ICD-10-CM | POA: Insufficient documentation

## 2015-07-23 DIAGNOSIS — N400 Enlarged prostate without lower urinary tract symptoms: Secondary | ICD-10-CM | POA: Diagnosis not present

## 2015-07-23 DIAGNOSIS — Z79899 Other long term (current) drug therapy: Secondary | ICD-10-CM | POA: Insufficient documentation

## 2015-07-23 DIAGNOSIS — Z4789 Encounter for other orthopedic aftercare: Secondary | ICD-10-CM | POA: Diagnosis not present

## 2015-07-23 DIAGNOSIS — Z419 Encounter for procedure for purposes other than remedying health state, unspecified: Secondary | ICD-10-CM

## 2015-07-23 DIAGNOSIS — M86522 Other chronic hematogenous osteomyelitis, left humerus: Secondary | ICD-10-CM | POA: Diagnosis not present

## 2015-07-23 DIAGNOSIS — G4733 Obstructive sleep apnea (adult) (pediatric): Secondary | ICD-10-CM | POA: Diagnosis not present

## 2015-07-23 DIAGNOSIS — S42302D Unspecified fracture of shaft of humerus, left arm, subsequent encounter for fracture with routine healing: Secondary | ICD-10-CM | POA: Insufficient documentation

## 2015-07-23 DIAGNOSIS — F419 Anxiety disorder, unspecified: Secondary | ICD-10-CM | POA: Insufficient documentation

## 2015-07-23 DIAGNOSIS — S42302A Unspecified fracture of shaft of humerus, left arm, initial encounter for closed fracture: Secondary | ICD-10-CM | POA: Diagnosis not present

## 2015-07-23 HISTORY — PX: EXTERNAL FIXATION REMOVAL: SHX5040

## 2015-07-23 LAB — CBC
HCT: 46.1 % (ref 39.0–52.0)
HEMOGLOBIN: 15.6 g/dL (ref 13.0–17.0)
MCH: 32.2 pg (ref 26.0–34.0)
MCHC: 33.8 g/dL (ref 30.0–36.0)
MCV: 95.1 fL (ref 78.0–100.0)
Platelets: 177 10*3/uL (ref 150–400)
RBC: 4.85 MIL/uL (ref 4.22–5.81)
RDW: 13.7 % (ref 11.5–15.5)
WBC: 5.2 10*3/uL (ref 4.0–10.5)

## 2015-07-23 LAB — BASIC METABOLIC PANEL
Anion gap: 11 (ref 5–15)
BUN: 19 mg/dL (ref 6–20)
CHLORIDE: 105 mmol/L (ref 101–111)
CO2: 23 mmol/L (ref 22–32)
CREATININE: 0.82 mg/dL (ref 0.61–1.24)
Calcium: 9.7 mg/dL (ref 8.9–10.3)
GFR calc non Af Amer: >60 mL/min (ref >60)
Glucose, Bld: 157 mg/dL — ABNORMAL HIGH (ref 65–99)
Potassium: 4.3 mmol/L (ref 3.5–5.1)
SODIUM: 139 mmol/L (ref 135–145)

## 2015-07-23 SURGERY — REMOVAL, EXTERNAL FIXATION DEVICE, UPPER EXTREMITY
Anesthesia: General | Site: Arm Upper | Laterality: Left

## 2015-07-23 MED ORDER — MIDAZOLAM HCL 2 MG/2ML IJ SOLN: 0.5000 mg | Freq: Once | INTRAMUSCULAR | Status: DC | PRN

## 2015-07-23 MED ORDER — LACTATED RINGERS IV SOLN
INTRAVENOUS | Status: DC
Start: 2015-07-23 — End: 2015-07-23

## 2015-07-23 MED ORDER — CHLORHEXIDINE GLUCONATE 4 % EX LIQD: 60.0000 mL | Freq: Once | CUTANEOUS | Status: DC

## 2015-07-23 MED ORDER — MIDAZOLAM HCL 2 MG/2ML IJ SOLN
INTRAMUSCULAR | Status: AC
  Filled 2015-07-23: qty 2

## 2015-07-23 MED ORDER — LIDOCAINE HCL (CARDIAC) 20 MG/ML IV SOLN
INTRAVENOUS | Status: DC | PRN
  Administered 2015-07-23: 50 mg via INTRAVENOUS

## 2015-07-23 MED ORDER — MIDAZOLAM HCL 5 MG/5ML IJ SOLN
INTRAMUSCULAR | Status: DC | PRN
  Administered 2015-07-23 (×3): 1 mg via INTRAVENOUS

## 2015-07-23 MED ORDER — ALBUMIN HUMAN 5 % IV SOLN
INTRAVENOUS | Status: DC | PRN
  Administered 2015-07-23: 10:00:00 via INTRAVENOUS

## 2015-07-23 MED ORDER — PROPOFOL 10 MG/ML IV BOLUS
INTRAVENOUS | Status: DC | PRN
  Administered 2015-07-23: 50 mg via INTRAVENOUS
  Administered 2015-07-23: 200 mg via INTRAVENOUS

## 2015-07-23 MED ORDER — PHENYLEPHRINE 40 MCG/ML (10ML) SYRINGE FOR IV PUSH (FOR BLOOD PRESSURE SUPPORT)
PREFILLED_SYRINGE | INTRAVENOUS | Status: AC
  Filled 2015-07-23: qty 20

## 2015-07-23 MED ORDER — 0.9 % SODIUM CHLORIDE (POUR BTL) OPTIME
TOPICAL | Status: DC | PRN
  Administered 2015-07-23: 1000 mL

## 2015-07-23 MED ORDER — DEXTROSE 5 % IV SOLN
3.0000 g | INTRAVENOUS | Status: AC
  Administered 2015-07-23: 2 g via INTRAVENOUS
  Filled 2015-07-23: qty 3000

## 2015-07-23 MED ORDER — CEFAZOLIN SODIUM-DEXTROSE 2-3 GM-% IV SOLR
INTRAVENOUS | Status: AC
  Filled 2015-07-23: qty 50

## 2015-07-23 MED ORDER — FENTANYL CITRATE (PF) 250 MCG/5ML IJ SOLN
INTRAMUSCULAR | Status: AC
  Filled 2015-07-23: qty 5

## 2015-07-23 MED ORDER — PROPOFOL 10 MG/ML IV BOLUS
INTRAVENOUS | Status: AC
  Filled 2015-07-23: qty 40

## 2015-07-23 MED ORDER — FENTANYL CITRATE (PF) 100 MCG/2ML IJ SOLN
INTRAMUSCULAR | Status: DC | PRN
  Administered 2015-07-23 (×2): 100 ug via INTRAVENOUS
  Administered 2015-07-23: 50 ug via INTRAVENOUS

## 2015-07-23 MED ORDER — HYDROMORPHONE HCL 1 MG/ML IJ SOLN: 0.2500 mg | INTRAMUSCULAR | Status: DC | PRN

## 2015-07-23 MED ORDER — SUCCINYLCHOLINE CHLORIDE 20 MG/ML IJ SOLN
INTRAMUSCULAR | Status: AC
  Filled 2015-07-23: qty 1

## 2015-07-23 MED ORDER — PROMETHAZINE HCL 25 MG/ML IJ SOLN: 6.2500 mg | INTRAMUSCULAR | Status: DC | PRN

## 2015-07-23 MED ORDER — PHENYLEPHRINE HCL 10 MG/ML IJ SOLN
INTRAMUSCULAR | Status: DC | PRN
  Administered 2015-07-23 (×3): 80 ug via INTRAVENOUS
  Administered 2015-07-23 (×3): 40 ug via INTRAVENOUS
  Administered 2015-07-23: 80 ug via INTRAVENOUS
  Administered 2015-07-23: 40 ug via INTRAVENOUS

## 2015-07-23 MED ORDER — PHENYLEPHRINE HCL 10 MG/ML IJ SOLN
10.0000 mg | INTRAMUSCULAR | Status: DC | PRN
  Administered 2015-07-23: 60 ug/min via INTRAVENOUS

## 2015-07-23 MED ORDER — MEPERIDINE HCL 25 MG/ML IJ SOLN: 6.2500 mg | INTRAMUSCULAR | Status: DC | PRN

## 2015-07-23 MED ORDER — LIDOCAINE HCL (CARDIAC) 20 MG/ML IV SOLN
INTRAVENOUS | Status: AC
  Filled 2015-07-23: qty 5

## 2015-07-23 MED ORDER — LACTATED RINGERS IV SOLN
INTRAVENOUS | Status: DC
  Administered 2015-07-23: 07:00:00 via INTRAVENOUS

## 2015-07-23 SURGICAL SUPPLY — 61 items
BANDAGE ELASTIC 4 VELCRO ST LF (GAUZE/BANDAGES/DRESSINGS) ×2 IMPLANT
BANDAGE ELASTIC 6 VELCRO ST LF (GAUZE/BANDAGES/DRESSINGS) ×2 IMPLANT
BANDAGE ESMARK 6X9 LF (GAUZE/BANDAGES/DRESSINGS) ×1 IMPLANT
BNDG CMPR 9X6 STRL LF SNTH (GAUZE/BANDAGES/DRESSINGS) ×1
BNDG COHESIVE 6X5 TAN STRL LF (GAUZE/BANDAGES/DRESSINGS) ×2 IMPLANT
BNDG ESMARK 6X9 LF (GAUZE/BANDAGES/DRESSINGS) ×2
BNDG GAUZE ELAST 4 BULKY (GAUZE/BANDAGES/DRESSINGS) ×4 IMPLANT
BRUSH SCRUB DISP (MISCELLANEOUS) ×4 IMPLANT
CLEANER TIP ELECTROSURG 2X2 (MISCELLANEOUS) ×2 IMPLANT
COVER SURGICAL LIGHT HANDLE (MISCELLANEOUS) ×4 IMPLANT
CUFF TOURNIQUET SINGLE 18IN (TOURNIQUET CUFF) IMPLANT
CUFF TOURNIQUET SINGLE 24IN (TOURNIQUET CUFF) IMPLANT
CUFF TOURNIQUET SINGLE 34IN LL (TOURNIQUET CUFF) IMPLANT
DRAPE C-ARM 42X72 X-RAY (DRAPES) IMPLANT
DRAPE C-ARMOR (DRAPES) ×2 IMPLANT
DRAPE OEC MINIVIEW 54X84 (DRAPES) ×2 IMPLANT
DRAPE U-SHAPE 47X51 STRL (DRAPES) ×2 IMPLANT
DRSG ADAPTIC 3X8 NADH LF (GAUZE/BANDAGES/DRESSINGS) ×2 IMPLANT
DRSG MEPILEX BORDER 4X4 (GAUZE/BANDAGES/DRESSINGS) ×2 IMPLANT
ELECT REM PT RETURN 9FT ADLT (ELECTROSURGICAL) ×2
ELECTRODE REM PT RTRN 9FT ADLT (ELECTROSURGICAL) ×1 IMPLANT
EVACUATOR 1/8 PVC DRAIN (DRAIN) IMPLANT
GAUZE SPONGE 4X4 12PLY STRL (GAUZE/BANDAGES/DRESSINGS) ×2 IMPLANT
GLOVE BIO SURGEON STRL SZ7.5 (GLOVE) ×2 IMPLANT
GLOVE BIO SURGEON STRL SZ8 (GLOVE) ×2 IMPLANT
GLOVE BIOGEL PI IND STRL 7.5 (GLOVE) ×1 IMPLANT
GLOVE BIOGEL PI IND STRL 8 (GLOVE) ×1 IMPLANT
GLOVE BIOGEL PI INDICATOR 7.5 (GLOVE) ×1
GLOVE BIOGEL PI INDICATOR 8 (GLOVE) ×1
GOWN STRL REUS W/ TWL LRG LVL3 (GOWN DISPOSABLE) ×2 IMPLANT
GOWN STRL REUS W/ TWL XL LVL3 (GOWN DISPOSABLE) ×1 IMPLANT
GOWN STRL REUS W/TWL LRG LVL3 (GOWN DISPOSABLE) ×4
GOWN STRL REUS W/TWL XL LVL3 (GOWN DISPOSABLE) ×2
KIT BASIN OR (CUSTOM PROCEDURE TRAY) ×2 IMPLANT
KIT ROOM TURNOVER OR (KITS) ×2 IMPLANT
MANIFOLD NEPTUNE II (INSTRUMENTS) ×2 IMPLANT
NEEDLE 22X1 1/2 (OR ONLY) (NEEDLE) IMPLANT
NS IRRIG 1000ML POUR BTL (IV SOLUTION) ×2 IMPLANT
PACK ORTHO EXTREMITY (CUSTOM PROCEDURE TRAY) ×2 IMPLANT
PAD ARMBOARD 7.5X6 YLW CONV (MISCELLANEOUS) ×4 IMPLANT
PADDING CAST COTTON 6X4 STRL (CAST SUPPLIES) ×6 IMPLANT
SPONGE LAP 18X18 X RAY DECT (DISPOSABLE) ×2 IMPLANT
SPONGE SCRUB IODOPHOR (GAUZE/BANDAGES/DRESSINGS) ×2 IMPLANT
STAPLER VISISTAT 35W (STAPLE) IMPLANT
STOCKINETTE IMPERVIOUS LG (DRAPES) ×2 IMPLANT
STRIP CLOSURE SKIN 1/2X4 (GAUZE/BANDAGES/DRESSINGS) IMPLANT
SUCTION FRAZIER TIP 10 FR DISP (SUCTIONS) IMPLANT
SUT ETHILON 3 0 PS 1 (SUTURE) IMPLANT
SUT PDS AB 2-0 CT1 27 (SUTURE) IMPLANT
SUT VIC AB 0 CT1 27 (SUTURE)
SUT VIC AB 0 CT1 27XBRD ANBCTR (SUTURE) IMPLANT
SUT VIC AB 2-0 CT1 27 (SUTURE)
SUT VIC AB 2-0 CT1 TAPERPNT 27 (SUTURE) IMPLANT
SYR CONTROL 10ML LL (SYRINGE) IMPLANT
TAPE CLOTH SURG 4X10 WHT LF (GAUZE/BANDAGES/DRESSINGS) ×1 IMPLANT
TOWEL OR 17X24 6PK STRL BLUE (TOWEL DISPOSABLE) ×4 IMPLANT
TOWEL OR 17X26 10 PK STRL BLUE (TOWEL DISPOSABLE) ×4 IMPLANT
TUBE CONNECTING 12X1/4 (SUCTIONS) ×2 IMPLANT
UNDERPAD 30X30 INCONTINENT (UNDERPADS AND DIAPERS) ×2 IMPLANT
WATER STERILE IRR 1000ML POUR (IV SOLUTION) ×4 IMPLANT
YANKAUER SUCT BULB TIP NO VENT (SUCTIONS) ×2 IMPLANT

## 2015-07-23 NOTE — Anesthesia Procedure Notes (Signed)
Procedure Name: Intubation Date/Time: 07/23/2015 9:11 AM Performed by: Terrill Mohr Pre-anesthesia Checklist: Patient identified, Emergency Drugs available, Suction available and Patient being monitored Patient Re-evaluated:Patient Re-evaluated prior to inductionOxygen Delivery Method: Circle system utilized Preoxygenation: Pre-oxygenation with 100% oxygen Intubation Type: IV induction Ventilation: Mask ventilation without difficulty Laryngoscope Size: Mac and 4 Grade View: Grade II Tube type: Oral Tube size: 7.5 mm Number of attempts: 1 Airway Equipment and Method: Stylet Placement Confirmation: ETT inserted through vocal cords under direct vision,  breath sounds checked- equal and bilateral and positive ETCO2 Secured at: 22 (cm at teeth) cm Tube secured with: Tape Dental Injury: Teeth and Oropharynx as per pre-operative assessment

## 2015-07-23 NOTE — Op Note (Signed)
[name], [name] NO.:  [id number]  MEDICAL RECORD NO.:  [id number]  LOCATION:  [facility]                         FACILITY:  [facility]  PHYSICIAN:  Astrid Divine. Marcelino Scot, M.D. 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  PROCEDURE: 1. Removal of external fixator under anesthesia. 2. Debridement and curettage of pin sites. 3. Stress view under fluoro of the fracture site which did demonstrate     union.  SURGEON:  Astrid Divine. Marcelino Scot, MD  ASSISTANT:  Ainsley Spinner, PA-C  ANESTHESIA:  General.  COMPLICATIONS:  None.  DISPOSITION:  To PACU.  CONDITION:  Stable.  BRIEF SUMMARY AND INDICATION FOR PROCEDURE:  Tanner Garza is a 32- year-old male, status post ORIF of his left humerus and was complicated by infection treated with debridement and external fixation by Dr. Beatriz Stallion.  He saw the patient, subsequently and followed him until union of his fracture and he now presents for removal of the fixator.  I did discuss the possibility of occult nonunion and we would stress his fracture site intraoperatively, other nerve injury, vessel injury, anesthetic complications, and others and patient acknowledged these risks and did wish to proceed.  BRIEF SUMMARY OF PROCEDURE:  The patient was taken to operating room, general anesthesia was induced to his left upper extremity and then underwent removal of the fixator.  Thorough chlorhexidine scrub was performed and then serial curettage of all of his pin sites including skin, subcutaneous tissue, muscle, fascia, and the near cortex of the bone.  These were copiously irrigated with saline.  We then brought in the C-arm stress  view and evaluation was performed of the humeral shaft fracture.  We did not identify any motion and did not observe any instability or cantilever.  Sterile dressing was applied.  The patient was awakened from anesthesia and transported to PACU in stable condition.  Ainsley Spinner, PA-C did assist with me and was present throughout the procedure.  PROGNOSIS:  Mr. Galeana will be progressed with therapy as he continues with unrestricted range of motion and now can add strengthening.  We will plan to see him back in the office for re-evaluation in 2 weeks with new x-rays.     Astrid Divine. Marcelino Scot, M.D.     MHH/MEDQ  D:  07/23/2015  T:  07/23/2015  Job:  [id number]

## 2015-07-23 NOTE — Progress Notes (Signed)
No orders this AM

## 2015-07-23 NOTE — Anesthesia Postprocedure Evaluation (Signed)
Anesthesia Post Note  Patient: Tanner Garza  Procedure(s) Performed: Procedure(s) (LRB): REMOVAL EXTERNAL FIXATION LEFT ARM (Left)  Patient location during evaluation: PACU Anesthesia Type: General Level of consciousness: awake and alert, oriented and patient cooperative Pain management: pain level controlled Vital Signs Assessment: post-procedure vital signs reviewed and stable Respiratory status: spontaneous breathing, nonlabored ventilation and respiratory function stable Cardiovascular status: blood pressure returned to baseline and stable Postop Assessment: no signs of nausea or vomiting Anesthetic complications: no    Last Vitals:  Filed Vitals:   07/23/15 1045 07/23/15 1104  BP: 123/88 155/93  Pulse: 80 73  Temp:    Resp: 15 18    Last Pain:  Filed Vitals:   07/23/15 1106  PainSc: 0-No pain                 Shahid Flori,E. [name]

## 2015-07-23 NOTE — Discharge Instructions (Signed)
Orthopaedic Trauma Service Discharge Instructions   General Discharge Instructions  WEIGHT BEARING STATUS: no lifting heavier than 5lbs with Left arm  RANGE OF MOTION/ACTIVITY: Range of motion as tolerated Left upper extremity   Wound Care: ok to shower and wash pinsites with soap and water. Keep covered until pinsites dry   PAIN MEDICATION USE AND EXPECTATIONS  You have likely been given narcotic medications to help control your pain.  After a traumatic event that results in an fracture (broken bone) with or without surgery, it is ok to use narcotic pain medications to help control one's pain.  We understand that everyone responds to pain differently and each individual patient will be evaluated on a regular basis for the continued need for narcotic medications. Ideally, narcotic medication use should last no more than 6-8 weeks (coinciding with fracture healing).   As a patient it is your responsibility as well to monitor narcotic medication use and report the amount and frequency you use these medications when you come to your office visit.   We would also advise that if you are using narcotic medications, you should take a dose prior to therapy to maximize you participation.  IF YOU ARE ON NARCOTIC MEDICATIONS IT IS NOT PERMISSIBLE TO OPERATE A MOTOR VEHICLE (MOTORCYCLE/CAR/TRUCK/MOPED) OR HEAVY MACHINERY DO NOT MIX NARCOTICS WITH OTHER CNS (CENTRAL NERVOUS SYSTEM) DEPRESSANTS SUCH AS ALCOHOL  Diet: as you were eating previously.  Can use over the counter stool softeners and bowel preparations, such as Miralax, to help with bowel movements.  Narcotics can be constipating.  Be sure to drink plenty of fluids    STOP SMOKING OR USING NICOTINE PRODUCTS!!!!  As discussed nicotine severely impairs your body's ability to heal surgical and traumatic wounds but also impairs bone healing.  Wounds and bone heal by forming microscopic blood vessels (angiogenesis) and nicotine is a vasoconstrictor  (essentially, shrinks blood vessels).  Therefore, if vasoconstriction occurs to these microscopic blood vessels they essentially disappear and are unable to deliver necessary nutrients to the healing tissue.  This is one modifiable factor that you can do to dramatically increase your chances of healing your injury.    (This means no smoking, no nicotine gum, patches, etc)  DO NOT USE NONSTEROIDAL ANTI-INFLAMMATORY DRUGS (NSAID'S)  Using products such as Advil (ibuprofen), Aleve (naproxen), Motrin (ibuprofen) for additional pain control during fracture healing can delay and/or prevent the healing response.  If you would like to take over the counter (OTC) medication, Tylenol (acetaminophen) is ok.  However, some narcotic medications that are given for pain control contain acetaminophen as well. Therefore, you should not exceed more than 4000 mg of tylenol in a day if you do not have liver disease.  Also note that there are may OTC medicines, such as cold medicines and allergy medicines that my contain tylenol as well.  If you have any questions about medications and/or interactions please ask your doctor/PA or your pharmacist.      ICE AND ELEVATE INJURED/OPERATIVE EXTREMITY  Using ice and elevating the injured extremity above your heart can help with swelling and pain control.  Icing in a pulsatile fashion, such as 20 minutes on and 20 minutes off, can be followed.    Do not place ice directly on skin. Make sure there is a barrier between to skin and the ice pack.    Using frozen items such as frozen peas works well as the conform nicely to the are that needs to be iced.  USE AN  ACE WRAP OR TED HOSE FOR SWELLING CONTROL  In addition to icing and elevation, Ace wraps or TED hose are used to help limit and resolve swelling.  It is recommended to use Ace wraps or TED hose until you are informed to stop.    When using Ace Wraps start the wrapping distally (farthest away from the body) and wrap proximally  (closer to the body)   Example: If you had surgery on your leg or thing and you do not have a splint on, start the ace wrap at the toes and work your way up to the thigh        If you had surgery on your upper extremity and do not have a splint on, start the ace wrap at your fingers and work your way up to the upper arm  IF YOU ARE IN A SPLINT OR CAST DO NOT [place]   If your splint gets wet for any reason please contact the office immediately. You may shower in your splint or cast as long as you keep it dry.  This can be done by wrapping in a cast cover or garbage back (or similar)  Do Not stick any thing down your splint or cast such as pencils, money, or hangers to try and scratch yourself with.  If you feel itchy take benadryl as prescribed on the bottle for itching  IF YOU ARE IN A CAM BOOT (BLACK BOOT)  You may remove boot periodically. Perform daily dressing changes as noted below.  Wash the liner of the boot regularly and wear a sock when wearing the boot. It is recommended that you sleep in the boot until told otherwise  CALL THE OFFICE WITH ANY QUESTIONS OR CONCERTS: [phone]      Discharge Wound Care Instructions  Do NOT apply any ointments, solutions or lotions to pin sites or surgical wounds.  These prevent needed drainage and even though solutions like hydrogen peroxide kill bacteria, they also damage cells lining the pin sites that help fight infection.  Applying lotions or ointments can keep the wounds moist and can cause them to breakdown and open up as well. This can increase the risk for infection. When in doubt call the office.  Surgical incisions should be dressed daily.  If any drainage is noted, use one layer of adaptic, then gauze, Kerlix, and an ace wrap.  Once the incision is completely dry and without drainage, it may be left open to air out.  Showering may begin 36-48 hours later.  Cleaning gently with soap and water.  Traumatic wounds should  be dressed daily as well.    One layer of adaptic, gauze, Kerlix, then ace wrap.  The adaptic can be discontinued once the draining has ceased    If you have a wet to dry dressing: wet the gauze with saline the squeeze as much saline out so the gauze is moist (not soaking wet), place moistened gauze over wound, then place a dry gauze over the moist one, followed by Kerlix wrap, then ace wrap.

## 2015-07-23 NOTE — Transfer of Care (Signed)
Immediate Anesthesia Transfer of Care Note  Patient: Tanner Garza  Procedure(s) Performed: Procedure(s): REMOVAL EXTERNAL FIXATION LEFT ARM (Left)  Patient Location: PACU  Anesthesia Type:General  Level of Consciousness: awake, alert , oriented and patient cooperative  Airway & Oxygen Therapy: Patient Spontanous Breathing and Patient connected to face mask oxygen  Post-op Assessment: Report given to RN, Post -op Vital signs reviewed and stable and Patient moving all extremities  Post vital signs: Reviewed and stable  Last Vitals:  Filed Vitals:   07/23/15 0643 07/23/15 1000  BP: 162/103 124/92  Pulse: 85 78  Temp: 36.6 C 36.7 C  Resp: 20 15    Complications: No apparent anesthesia complications

## 2015-07-23 NOTE — H&P (Signed)
[facility]    Chief Complaint: retained ex fix Left humerus for infected nonunion  HPI:   70 y/o male with hx of L humerus fracture 11/2014 that was corrected surgically. Ultimately developed an infected nonunion. Hardware was removed and pt was placed into an external fixator. Pt completed 6 week course of IV abx and 4 week course of oral abx. Most recent CRP and ESR from 3 weeks ago are normal. Pt also being followed by ID   Past Medical History  Diagnosis Date  . Hypertension   . BPH (benign prostatic hyperplasia)   . Anxiety   . Esophageal reflux   . Bell's palsy 09/2001  . Hyperlipidemia   . Eczema   . Colon polyp   . SVT (supraventricular tachycardia) ([place])   . Complication of anesthesia     Slow to awaken  . Dysrhythmia     2 "bouts of tachycardia  . OSA (obstructive sleep apnea)     uses cpap    Past Surgical History  Procedure Laterality Date  . Cholecystectomy    . Appendectomy    . Replantation thumb Right   . Colonoscopy w/ polypectomy    . Orif humerus fracture Left 12/09/2014    Procedure: OPEN REDUCTION INTERNAL FIXATION (ORIF) HUMERAL SHAFT FRACTURE;  Surgeon: Leandrew Koyanagi, MD;  Location: [place];  Service: Orthopedics;  Laterality: Left;  . Open reduction internal fixation (orif) distal radial fracture Left 12/09/2014    Procedure: OPEN REDUCTION INTERNAL FIXATION (ORIF) DISTAL RADIAL FRACTURE;  Surgeon: Leandrew Koyanagi, MD;  Location: [place];  Service: Orthopedics;  Laterality: Left;  . I&d extremity Left 12/09/2014    Procedure: IRRIGATION AND DEBRIDEMENT LEFT LONG FINGER;  Surgeon: Leandrew Koyanagi, MD;  Location: [place];  Service: Orthopedics;  Laterality: Left;  . Orif humerus fracture Left 04/20/2015    Procedure: OPEN REDUCTION INTERNAL FIXATION (ORIF) LEFT HUMERAL SHAFT NONUNION ATTEMPTED, REMOVAL OF PLATE and SCREWS APPLICATION OF EXTERNAL FIXATOR LEFT HUMERUS PLACEMENT OF ANTIBIOTIC BEADS;  Surgeon: Leandrew Koyanagi, MD;  Location: [place];  Service:  Orthopedics;  Laterality: Left;    History reviewed. No pertinent family history. Social History:  reports that he has been smoking Cigarettes.  He has a 22.5 pack-year smoking history. He has never used smokeless tobacco. He reports that he does not drink alcohol or use illicit drugs.  Allergies:  Allergies  Allergen Reactions  . Atenolol     bradycardia  . Chantix [Varenicline]     Sedation   . Hctz [Hydrochlorothiazide]     Bradycardia   . Other     bandaid -rash    Medications Prior to Admission  Medication Sig Dispense Refill  . ALPRAZolam (XANAX) 0.5 MG tablet Take 0.5 mg by mouth every morning.     Marland Kitchen amLODipine (NORVASC) 5 MG tablet Take 5 mg by mouth every morning.     . Ascorbic Acid (VITAMIN C) 500 MG CHEW Chew 1 tablet (500 mg total) by mouth 2 (two) times daily. 84 tablet 0  . aspirin 81 MG tablet Take 81 mg by mouth every evening.    . dutasteride (AVODART) 0.5 MG capsule Take 0.5 mg by mouth daily with supper.     . gabapentin (NEURONTIN) 300 MG capsule Take 300 mg by mouth daily with supper.     . losartan (COZAAR) 100 MG tablet Take 100 mg by mouth daily with supper.    . Lutein 6 MG TABS Take 6 mg by  mouth daily with supper.     . Multiple Vitamin (MULTIVITAMIN) tablet Take 1 tablet by mouth every morning.     . ondansetron (ZOFRAN ODT) 4 MG disintegrating tablet Take 1 tablet (4 mg total) by mouth every 8 (eight) hours as needed for nausea or vomiting. 30 tablet 2  . OXYGEN-HELIUM IN Inhale into the lungs. CPAP    . pantoprazole (PROTONIX) 40 MG tablet Take 40 mg by mouth daily with supper.     . rifampin (RIFADIN) 300 MG capsule Take 1 capsule (300 mg total) by mouth 2 (two) times daily. 60 capsule 0  . spironolactone (ALDACTONE) 25 MG tablet Take 25 mg by mouth every morning.     . zinc sulfate 220 MG capsule Take 1 capsule (220 mg total) by mouth daily. 42 capsule 0    No results found for this or any previous visit (from the past 48 hour(s)). No results  found.  Review of Systems  Constitutional: Negative for fever and chills.  Respiratory: Negative for shortness of breath.   Cardiovascular: Negative for chest pain.    Blood pressure 162/103, pulse 85, temperature 97.9 F (36.6 C), temperature source Oral, resp. rate 20, height 6' (1.829 m), weight 123.378 kg (272 lb), SpO2 98 %. Physical Exam  Constitutional: He appears well-developed and well-nourished. No distress.  Cardiovascular: Normal rate and regular rhythm.   Respiratory: Effort normal and breath sounds normal.  Musculoskeletal:  Left Upper Extremity     Ex fix stable left humerus     Motor and sensory functions grossly intact   Ext warm   + Radial pulse    NT over nonunion site      Assessment/Plan  70 y/o male s/p ex fix for infected nonunion L humeral shaft fracture 04/2015  OR for removal of ex fix  Evaluation under fluoroscopy to confirm complete union  Outpt procedure   Jari Pigg, PA-C [facility] [phone] (P)  07/23/2015, 7:30 AM

## 2015-07-23 NOTE — Anesthesia Preprocedure Evaluation (Addendum)
Anesthesia Evaluation  Patient identified by MRN, date of birth, ID band Patient awake    Reviewed: Allergy & Precautions, NPO status , Patient's Chart, lab work & pertinent test results  History of Anesthesia Complications Negative for: history of anesthetic complications  Airway Mallampati: II  TM Distance: >3 FB Neck ROM: Full    Dental  (+) Missing, Dental Advisory Given   Pulmonary sleep apnea and Continuous Positive Airway Pressure Ventilation , Current Smoker,    breath sounds clear to auscultation       Cardiovascular hypertension, Pt. on medications (-) angina+ dysrhythmias Supra Ventricular Tachycardia  Rhythm:Regular Rate:Normal  '16 stress test: no ischemia, normal perfusion '16 ECHO: EF 50%, LVH, valves OK   Neuro/Psych Anxiety    GI/Hepatic Neg liver ROS, GERD  Medicated and Controlled,  Endo/Other  Morbid obesity  Renal/GU negative Renal ROS     Musculoskeletal   Abdominal (+) + obese,   Peds  Hematology negative hematology ROS (+)   Anesthesia Other Findings Full Beard  Reproductive/Obstetrics                         Anesthesia Physical Anesthesia Plan  ASA: III  Anesthesia Plan: General   Post-op Pain Management:    Induction: Intravenous  Airway Management Planned: Oral ETT  Additional Equipment:   Intra-op Plan:   Post-operative Plan: Extubation in OR  Informed Consent: I have reviewed the patients History and Physical, chart, labs and discussed the procedure including the risks, benefits and alternatives for the proposed anesthesia with the patient or authorized representative who has indicated his/her understanding and acceptance.   Dental advisory given  Plan Discussed with: CRNA and Surgeon  Anesthesia Plan Comments: (Plan routine monitors, GETA)        Anesthesia Quick Evaluation

## 2015-07-23 NOTE — Brief Op Note (Signed)
07/23/2015  9:43 AM  PATIENT:  Tanner Garza  70 y.o. male  PRE-OPERATIVE DIAGNOSIS:  left humeral shaft fracture nonunion  POST-OPERATIVE DIAGNOSIS:  left humeral shaft fracture nonunion  PROCEDURE:  Procedure(s): 1. REMOVAL EXTERNAL FIXATION LEFT ARM (Left)  2. Debridement and curettage of pins sites 3. Stress view of fracture site  SURGEON:  Surgeon(s) and Role:    * Altamese Ripley, MD - Primary  PHYSICIAN ASSISTANT: Ainsley Spinner, PA-C  ANESTHESIA:   general  I/O:     SPECIMEN:  No Specimen  TOURNIQUET:  * No tourniquets in log *  DICTATION: .Other Dictation: Dictation Number [id number]

## 2015-07-24 LAB — HEMOGLOBIN A1C
Hgb A1c MFr Bld: 6.7 % — ABNORMAL HIGH (ref 4.8–5.6)
MEAN PLASMA GLUCOSE: 146 mg/dL

## 2015-07-26 ENCOUNTER — Encounter (HOSPITAL_COMMUNITY): Payer: Self-pay | Admitting: Orthopedic Surgery

## 2015-08-04 DIAGNOSIS — S42302K Unspecified fracture of shaft of humerus, left arm, subsequent encounter for fracture with nonunion: Secondary | ICD-10-CM | POA: Diagnosis not present

## 2015-08-04 DIAGNOSIS — M25432 Effusion, left wrist: Secondary | ICD-10-CM | POA: Diagnosis not present

## 2015-08-06 DIAGNOSIS — M25512 Pain in left shoulder: Secondary | ICD-10-CM | POA: Diagnosis not present

## 2015-08-06 DIAGNOSIS — M25612 Stiffness of left shoulder, not elsewhere classified: Secondary | ICD-10-CM | POA: Diagnosis not present

## 2015-08-10 DIAGNOSIS — M25512 Pain in left shoulder: Secondary | ICD-10-CM | POA: Diagnosis not present

## 2015-08-10 DIAGNOSIS — M25612 Stiffness of left shoulder, not elsewhere classified: Secondary | ICD-10-CM | POA: Diagnosis not present

## 2015-08-13 DIAGNOSIS — M25612 Stiffness of left shoulder, not elsewhere classified: Secondary | ICD-10-CM | POA: Diagnosis not present

## 2015-08-13 DIAGNOSIS — M25512 Pain in left shoulder: Secondary | ICD-10-CM | POA: Diagnosis not present

## 2015-08-17 DIAGNOSIS — M25612 Stiffness of left shoulder, not elsewhere classified: Secondary | ICD-10-CM | POA: Diagnosis not present

## 2015-08-17 DIAGNOSIS — M25512 Pain in left shoulder: Secondary | ICD-10-CM | POA: Diagnosis not present

## 2015-08-20 DIAGNOSIS — M25612 Stiffness of left shoulder, not elsewhere classified: Secondary | ICD-10-CM | POA: Diagnosis not present

## 2015-08-20 DIAGNOSIS — M25512 Pain in left shoulder: Secondary | ICD-10-CM | POA: Diagnosis not present

## 2015-08-24 DIAGNOSIS — M25512 Pain in left shoulder: Secondary | ICD-10-CM | POA: Diagnosis not present

## 2015-08-24 DIAGNOSIS — M25612 Stiffness of left shoulder, not elsewhere classified: Secondary | ICD-10-CM | POA: Diagnosis not present

## 2015-08-27 DIAGNOSIS — M25512 Pain in left shoulder: Secondary | ICD-10-CM | POA: Diagnosis not present

## 2015-08-27 DIAGNOSIS — M25612 Stiffness of left shoulder, not elsewhere classified: Secondary | ICD-10-CM | POA: Diagnosis not present

## 2015-08-31 DIAGNOSIS — M25612 Stiffness of left shoulder, not elsewhere classified: Secondary | ICD-10-CM | POA: Diagnosis not present

## 2015-08-31 DIAGNOSIS — M25512 Pain in left shoulder: Secondary | ICD-10-CM | POA: Diagnosis not present

## 2015-09-01 ENCOUNTER — Other Ambulatory Visit: Payer: Self-pay | Admitting: Orthopedic Surgery

## 2015-09-01 DIAGNOSIS — S42302K Unspecified fracture of shaft of humerus, left arm, subsequent encounter for fracture with nonunion: Secondary | ICD-10-CM

## 2015-09-01 DIAGNOSIS — S62102K Fracture of unspecified carpal bone, left wrist, subsequent encounter for fracture with nonunion: Secondary | ICD-10-CM | POA: Diagnosis not present

## 2015-09-02 DIAGNOSIS — M25612 Stiffness of left shoulder, not elsewhere classified: Secondary | ICD-10-CM | POA: Diagnosis not present

## 2015-09-02 DIAGNOSIS — M25512 Pain in left shoulder: Secondary | ICD-10-CM | POA: Diagnosis not present

## 2015-09-06 ENCOUNTER — Ambulatory Visit
Admission: RE | Admit: 2015-09-06 | Discharge: 2015-09-06 | Disposition: A | Discharge status: Home / Self Care | Payer: Medicare Other | Source: Ambulatory Visit | Attending: Orthopedic Surgery | Admitting: Orthopedic Surgery

## 2015-09-06 DIAGNOSIS — S52572A Other intraarticular fracture of lower end of left radius, initial encounter for closed fracture: Secondary | ICD-10-CM | POA: Diagnosis not present

## 2015-09-06 DIAGNOSIS — S42302K Unspecified fracture of shaft of humerus, left arm, subsequent encounter for fracture with nonunion: Secondary | ICD-10-CM

## 2015-09-09 DIAGNOSIS — E785 Hyperlipidemia, unspecified: Secondary | ICD-10-CM | POA: Diagnosis not present

## 2015-09-09 DIAGNOSIS — I10 Essential (primary) hypertension: Secondary | ICD-10-CM | POA: Diagnosis not present

## 2015-09-09 DIAGNOSIS — N400 Enlarged prostate without lower urinary tract symptoms: Secondary | ICD-10-CM | POA: Diagnosis not present

## 2015-09-09 DIAGNOSIS — F419 Anxiety disorder, unspecified: Secondary | ICD-10-CM | POA: Diagnosis not present

## 2015-09-09 DIAGNOSIS — F17200 Nicotine dependence, unspecified, uncomplicated: Secondary | ICD-10-CM | POA: Diagnosis not present

## 2015-09-09 DIAGNOSIS — E1165 Type 2 diabetes mellitus with hyperglycemia: Secondary | ICD-10-CM | POA: Diagnosis not present

## 2015-09-20 DIAGNOSIS — H40052 Ocular hypertension, left eye: Secondary | ICD-10-CM | POA: Diagnosis not present

## 2015-09-20 DIAGNOSIS — H40051 Ocular hypertension, right eye: Secondary | ICD-10-CM | POA: Diagnosis not present

## 2015-10-04 DIAGNOSIS — E785 Hyperlipidemia, unspecified: Secondary | ICD-10-CM | POA: Diagnosis not present

## 2015-10-04 DIAGNOSIS — E668 Other obesity: Secondary | ICD-10-CM | POA: Diagnosis not present

## 2015-10-04 DIAGNOSIS — I119 Hypertensive heart disease without heart failure: Secondary | ICD-10-CM | POA: Diagnosis not present

## 2015-10-04 DIAGNOSIS — G4733 Obstructive sleep apnea (adult) (pediatric): Secondary | ICD-10-CM | POA: Diagnosis not present

## 2015-10-04 DIAGNOSIS — I471 Supraventricular tachycardia: Secondary | ICD-10-CM | POA: Diagnosis not present

## 2015-10-05 DIAGNOSIS — I119 Hypertensive heart disease without heart failure: Secondary | ICD-10-CM | POA: Diagnosis not present

## 2015-10-11 DIAGNOSIS — S42322P Displaced transverse fracture of shaft of humerus, left arm, subsequent encounter for fracture with malunion: Secondary | ICD-10-CM | POA: Diagnosis not present

## 2015-10-11 DIAGNOSIS — S62102P Fracture of unspecified carpal bone, left wrist, subsequent encounter for fracture with malunion: Secondary | ICD-10-CM | POA: Diagnosis not present

## 2015-10-11 DIAGNOSIS — Z969 Presence of functional implant, unspecified: Secondary | ICD-10-CM | POA: Diagnosis not present

## 2015-11-16 DIAGNOSIS — S62102P Fracture of unspecified carpal bone, left wrist, subsequent encounter for fracture with malunion: Secondary | ICD-10-CM | POA: Diagnosis not present

## 2015-11-16 DIAGNOSIS — M65832 Other synovitis and tenosynovitis, left forearm: Secondary | ICD-10-CM | POA: Diagnosis not present

## 2015-11-16 DIAGNOSIS — T8484XA Pain due to internal orthopedic prosthetic devices, implants and grafts, initial encounter: Secondary | ICD-10-CM | POA: Diagnosis not present

## 2015-11-26 DIAGNOSIS — Z4789 Encounter for other orthopedic aftercare: Secondary | ICD-10-CM | POA: Diagnosis not present

## 2015-12-06 DIAGNOSIS — G4733 Obstructive sleep apnea (adult) (pediatric): Secondary | ICD-10-CM | POA: Diagnosis not present

## 2015-12-06 DIAGNOSIS — I10 Essential (primary) hypertension: Secondary | ICD-10-CM | POA: Diagnosis not present

## 2015-12-09 DIAGNOSIS — Z4789 Encounter for other orthopedic aftercare: Secondary | ICD-10-CM | POA: Diagnosis not present

## 2015-12-09 DIAGNOSIS — S62102B Fracture of unspecified carpal bone, left wrist, initial encounter for open fracture: Secondary | ICD-10-CM | POA: Diagnosis not present

## 2015-12-15 IMAGING — CR DG RIBS W/ CHEST 3+V*L*
3 series · 3 of 3 positions shown · non-contrast
Comparison: 12/05/2014

CLINICAL DATA: Recent fall with left chest pain, initial encounter

EXAM:
LEFT RIBS AND CHEST - 3+ VIEW

[chest pa]
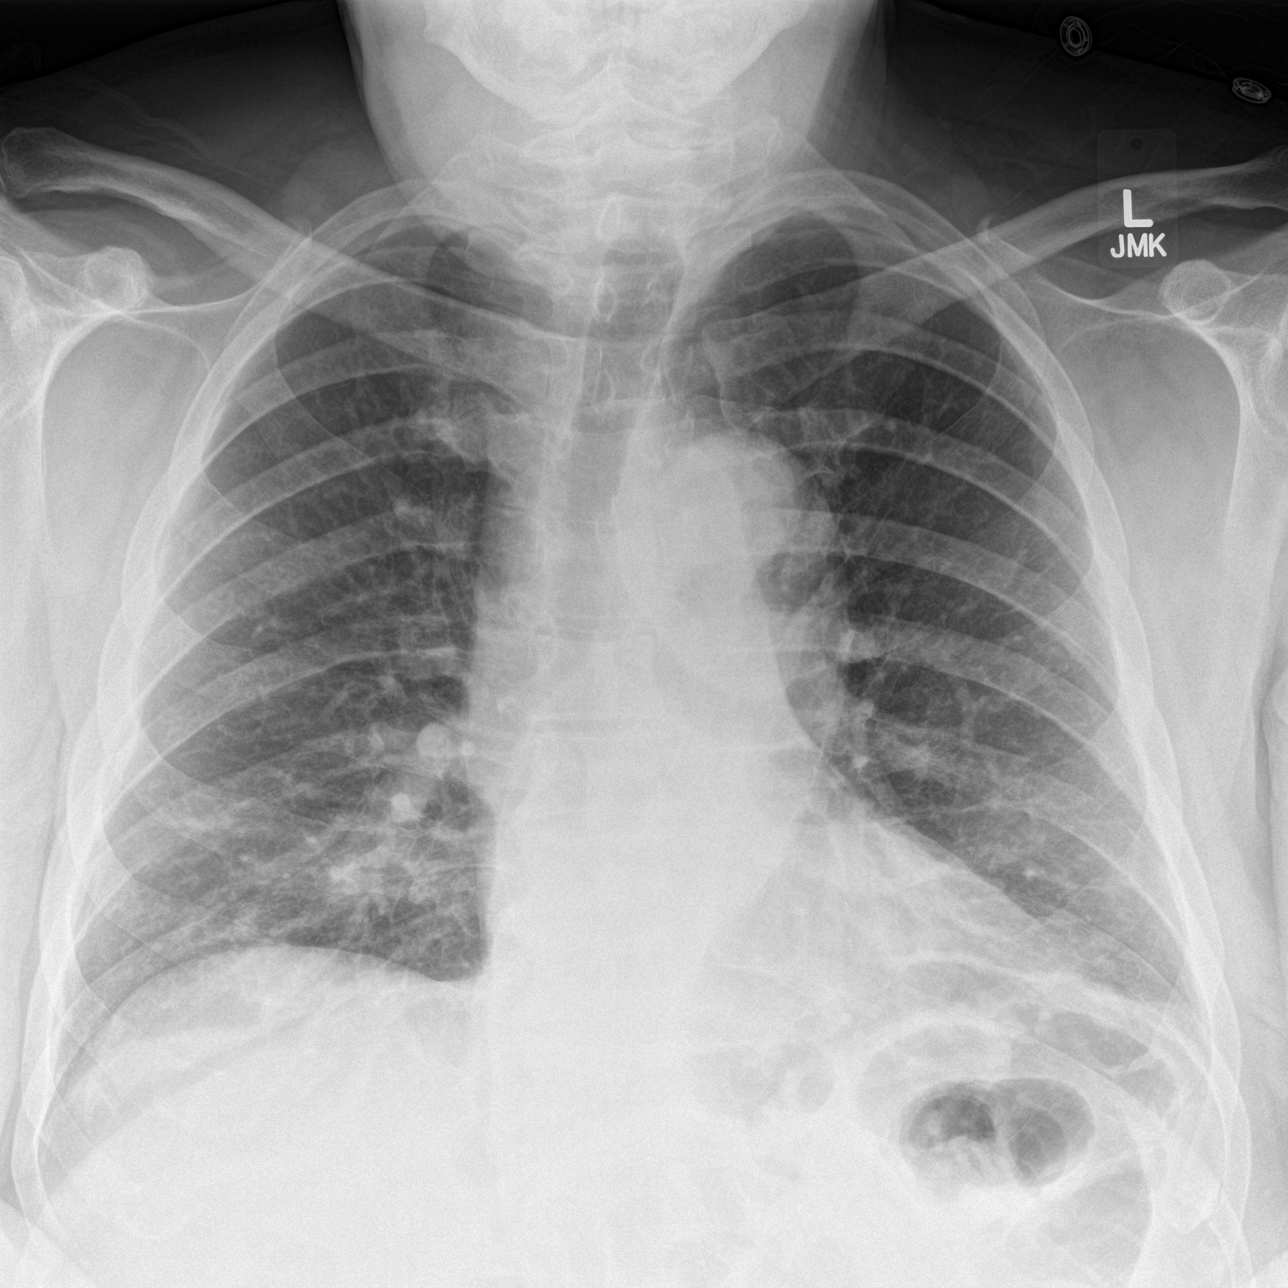

[rib pa obl]
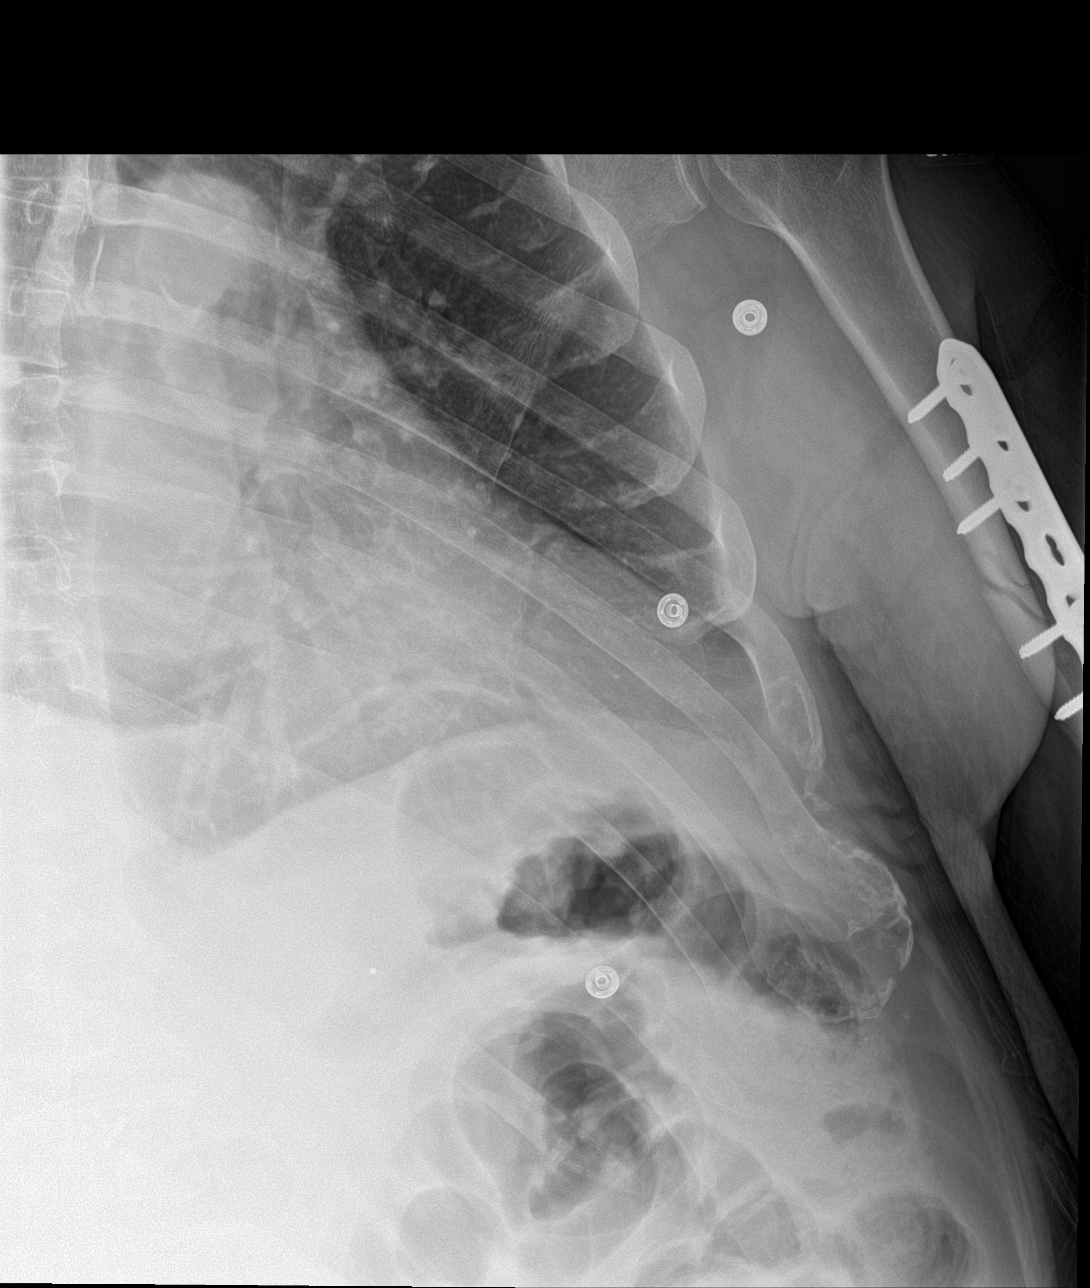

[rib pa]
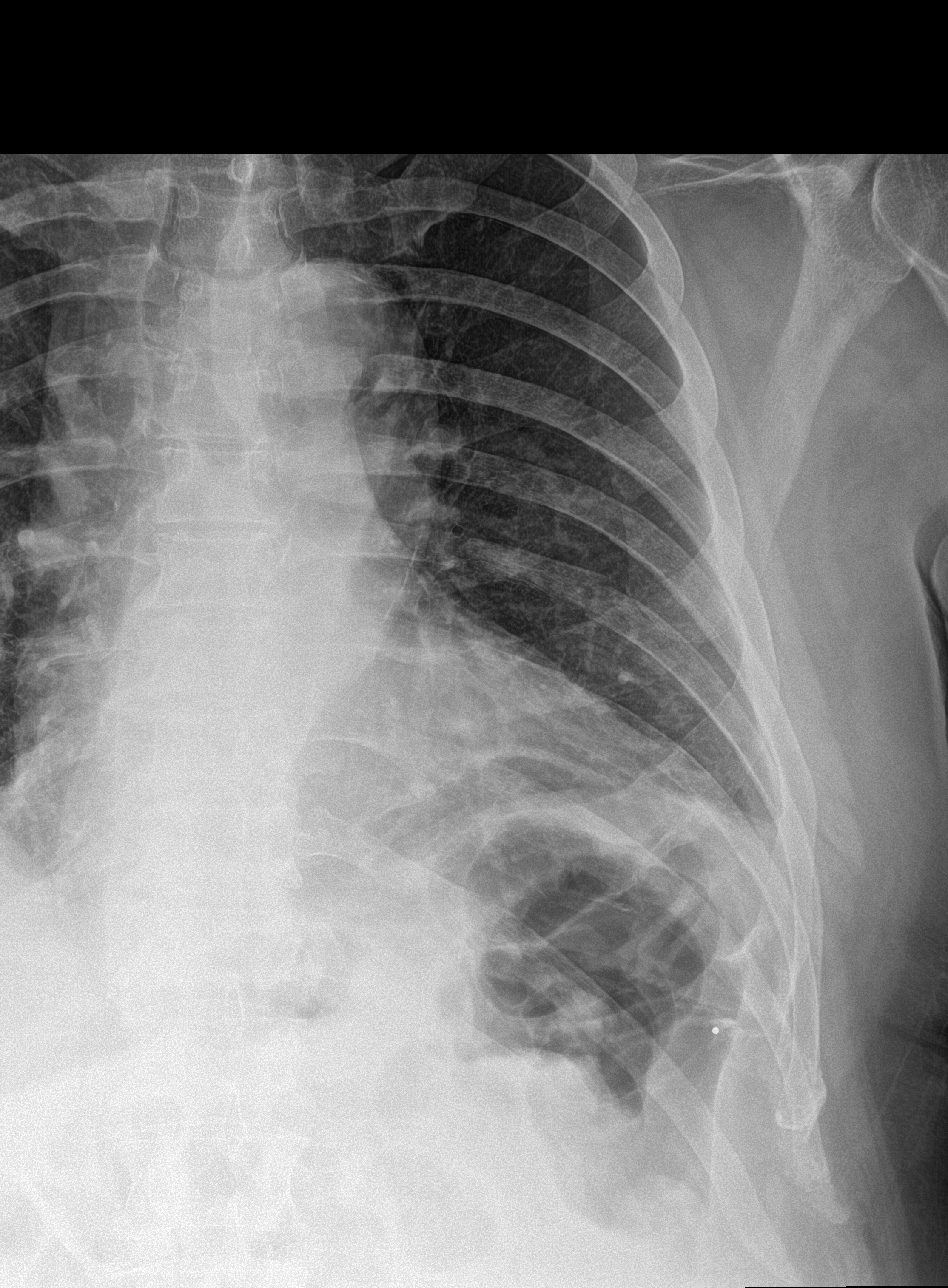

[3 of 3 positions shown; findings below may reference images not displayed]

FINDINGS: Cardiac shadow is within normal limits. Left rib cage is well
visualized. There is a mildly displaced fracture of the left eighth
rib posteriorly and anteriorly. Similar changes are noted in the
ninth rib posteriorly. No pneumothorax is seen. The lungs are clear
bilaterally.
IMPRESSION: Fractures of the left eighth and ninth ribs not well appreciated on
prior exams. No associated pneumothorax is noted.

## 2016-01-20 DIAGNOSIS — S62102P Fracture of unspecified carpal bone, left wrist, subsequent encounter for fracture with malunion: Secondary | ICD-10-CM | POA: Diagnosis not present

## 2016-03-21 DIAGNOSIS — H40051 Ocular hypertension, right eye: Secondary | ICD-10-CM | POA: Diagnosis not present

## 2016-03-21 DIAGNOSIS — H524 Presbyopia: Secondary | ICD-10-CM | POA: Diagnosis not present

## 2016-03-21 DIAGNOSIS — H2513 Age-related nuclear cataract, bilateral: Secondary | ICD-10-CM | POA: Diagnosis not present

## 2016-03-21 DIAGNOSIS — H40052 Ocular hypertension, left eye: Secondary | ICD-10-CM | POA: Diagnosis not present

## 2016-03-30 DIAGNOSIS — Z4789 Encounter for other orthopedic aftercare: Secondary | ICD-10-CM | POA: Diagnosis not present

## 2016-03-30 DIAGNOSIS — S62102P Fracture of unspecified carpal bone, left wrist, subsequent encounter for fracture with malunion: Secondary | ICD-10-CM | POA: Diagnosis not present

## 2016-04-18 DIAGNOSIS — Z23 Encounter for immunization: Secondary | ICD-10-CM | POA: Diagnosis not present

## 2016-05-05 DIAGNOSIS — I10 Essential (primary) hypertension: Secondary | ICD-10-CM | POA: Diagnosis not present

## 2016-05-05 DIAGNOSIS — E1165 Type 2 diabetes mellitus with hyperglycemia: Secondary | ICD-10-CM | POA: Diagnosis not present

## 2016-05-05 DIAGNOSIS — N400 Enlarged prostate without lower urinary tract symptoms: Secondary | ICD-10-CM | POA: Diagnosis not present

## 2016-05-05 DIAGNOSIS — K219 Gastro-esophageal reflux disease without esophagitis: Secondary | ICD-10-CM | POA: Diagnosis not present

## 2016-05-05 DIAGNOSIS — F17200 Nicotine dependence, unspecified, uncomplicated: Secondary | ICD-10-CM | POA: Diagnosis not present

## 2016-05-05 DIAGNOSIS — G4733 Obstructive sleep apnea (adult) (pediatric): Secondary | ICD-10-CM | POA: Diagnosis not present

## 2016-05-05 DIAGNOSIS — Z125 Encounter for screening for malignant neoplasm of prostate: Secondary | ICD-10-CM | POA: Diagnosis not present

## 2016-05-05 DIAGNOSIS — Z0000 Encounter for general adult medical examination without abnormal findings: Secondary | ICD-10-CM | POA: Diagnosis not present

## 2016-05-05 DIAGNOSIS — F419 Anxiety disorder, unspecified: Secondary | ICD-10-CM | POA: Diagnosis not present

## 2016-05-05 DIAGNOSIS — E7800 Pure hypercholesterolemia, unspecified: Secondary | ICD-10-CM | POA: Diagnosis not present

## 2016-05-16 DIAGNOSIS — I10 Essential (primary) hypertension: Secondary | ICD-10-CM | POA: Diagnosis not present

## 2016-05-16 DIAGNOSIS — E1165 Type 2 diabetes mellitus with hyperglycemia: Secondary | ICD-10-CM | POA: Diagnosis not present

## 2016-05-16 DIAGNOSIS — E7800 Pure hypercholesterolemia, unspecified: Secondary | ICD-10-CM | POA: Diagnosis not present

## 2016-05-16 DIAGNOSIS — Z125 Encounter for screening for malignant neoplasm of prostate: Secondary | ICD-10-CM | POA: Diagnosis not present

## 2016-06-29 DIAGNOSIS — Z4789 Encounter for other orthopedic aftercare: Secondary | ICD-10-CM | POA: Diagnosis not present

## 2016-06-29 DIAGNOSIS — S62102P Fracture of unspecified carpal bone, left wrist, subsequent encounter for fracture with malunion: Secondary | ICD-10-CM | POA: Diagnosis not present

## 2016-06-29 DIAGNOSIS — M79602 Pain in left arm: Secondary | ICD-10-CM | POA: Diagnosis not present

## 2016-07-23 IMAGING — RF DG HUMERUS 2V *L*
1 series · 1 of 1 positions shown · non-contrast
Comparison: 04/20/2015

CLINICAL DATA: Removal of external fixator.

EXAM:
DG C-ARM 61-120 MIN; LEFT HUMERUS - 2+ VIEW

[Series 1: run · 1 of 1 slices shown]
[im 1/1]
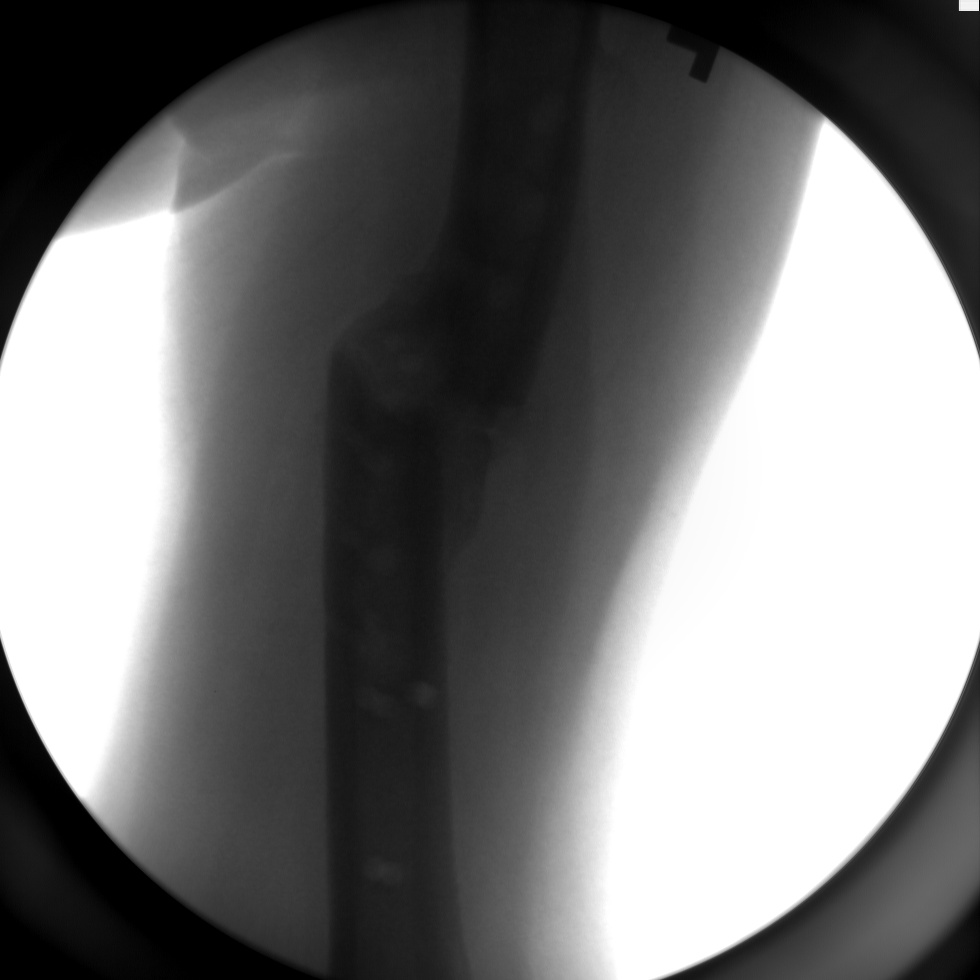

[1 of 1 positions shown; findings below may reference images not displayed]

FINDINGS: Single intraoperative spot image demonstrates removal of hardware
from the left humerus. Partially healed fracture noted within the
mid shaft with mild displacement. There is some bridging callus, but
the fracture line likely remains partially evident.
IMPRESSION: Removal of hardware from proximal left humerus. Partially healed
displaced midshaft left humeral fracture.

## 2016-09-06 IMAGING — CT CT WRIST*L* W/O CM
1 series · 9 of 14 positions shown, 12 images · non-contrast
Comparison: Intraoperative views of the left wrist 12/09/2014 and
plain films 12/05/2014.

CLINICAL DATA: History of fall from a ladder 12/05/2014 with a left
wrist fracture. Status post open reduction and internal fixation
12/09/2014. Left wrist swelling for 3 weeks. Question nonunion.
Subsequent encounter.

EXAM:
CT OF THE LEFT WRIST WITHOUT CONTRAST
TECHNIQUE: Multidetector CT imaging was performed according to the standard
protocol. Multiplanar CT image reconstructions were also generated.

[Series 10: thin bone 0.6 axial · oblique · 0.33mm/px · 9 of 370 slices shown, 12 images]
[im 29/370  soft-tissue]
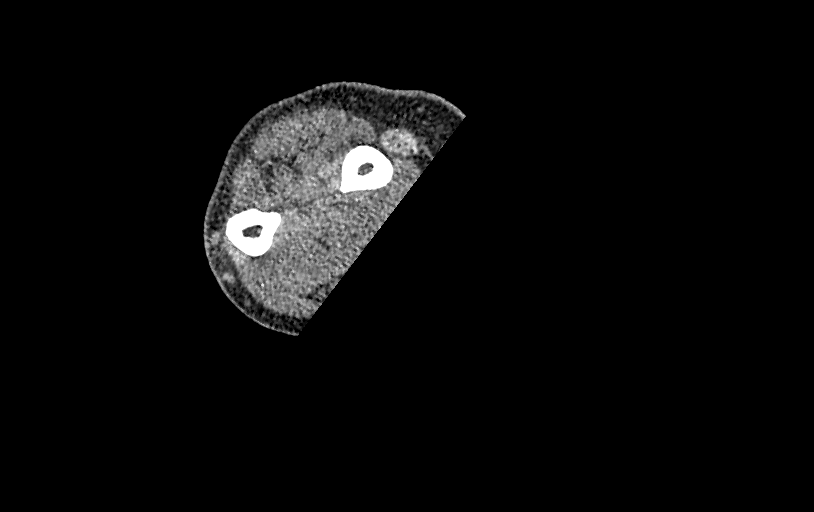
[im 29/370  bone]
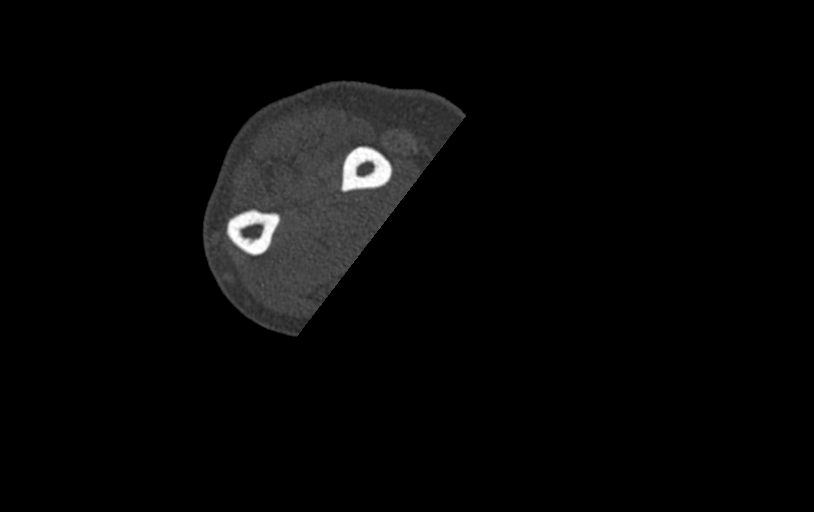
[im 86/370  bone]
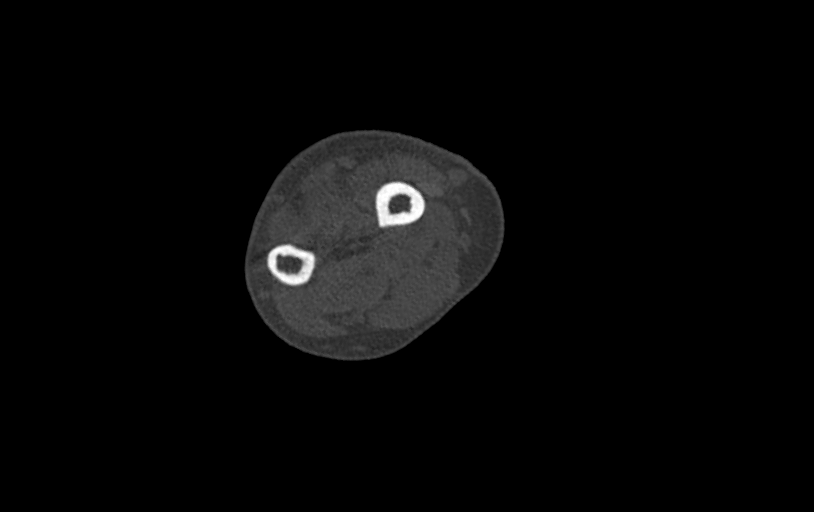
[im 114/370  bone]
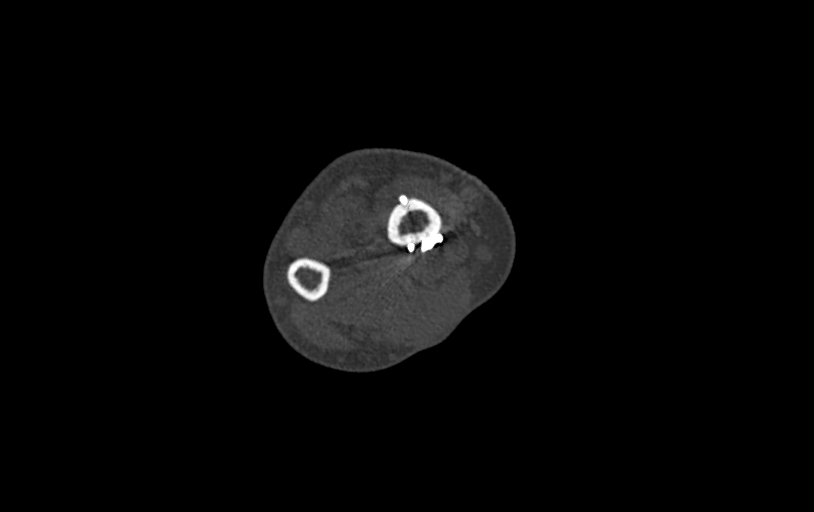
[im 142/370  bone]
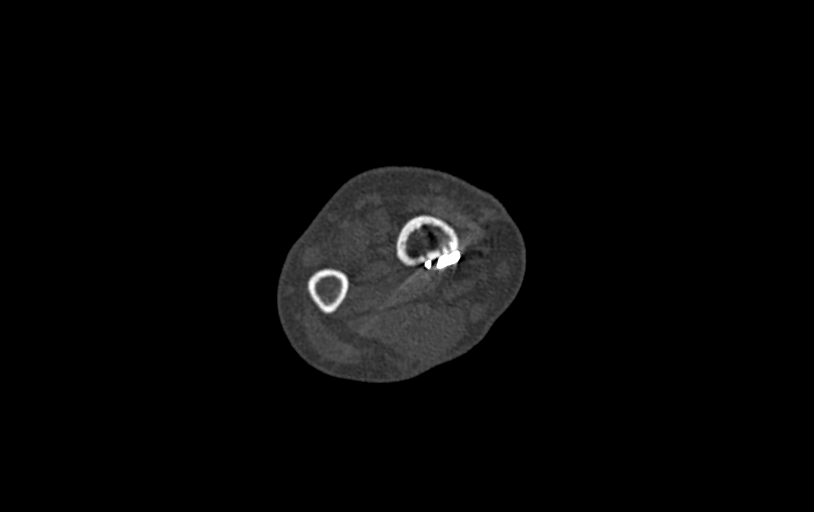
[im 199/370  soft-tissue]
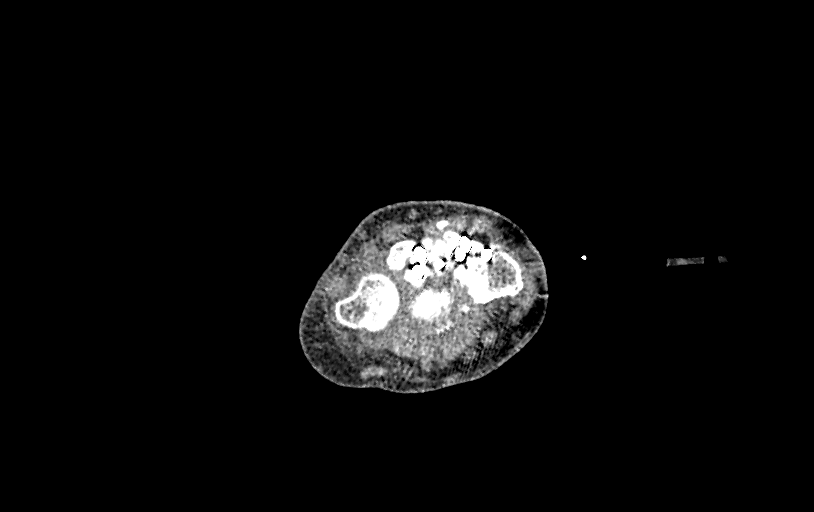
[im 199/370  bone]
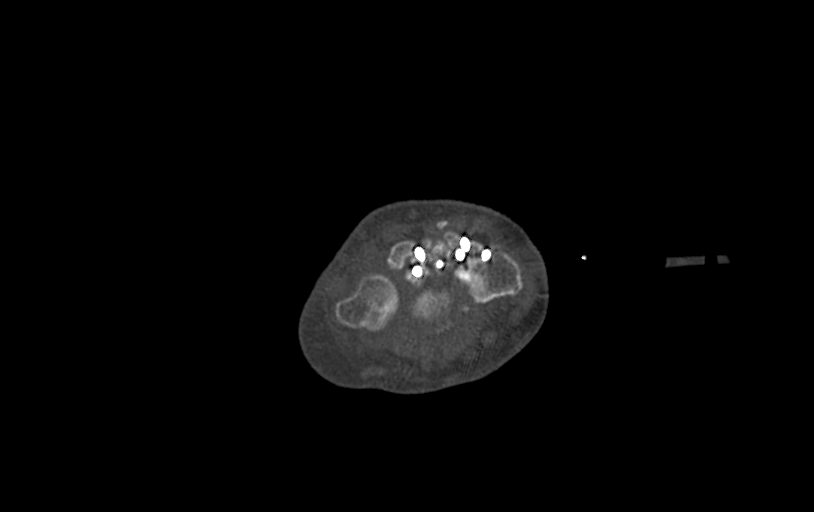
[im 228/370  bone]
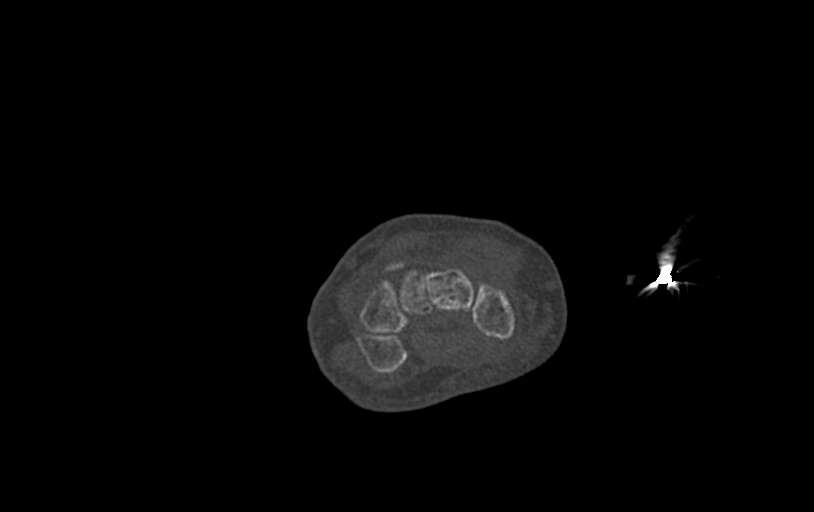
[im 256/370  bone]
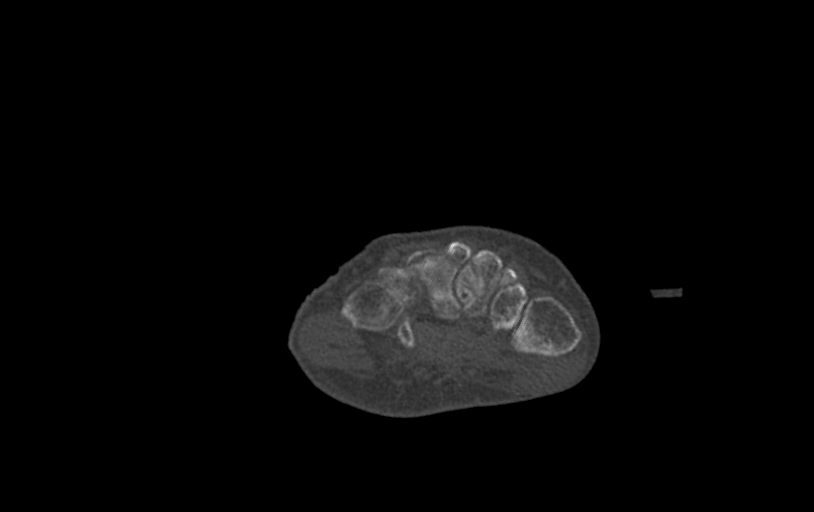
[im 313/370  bone]
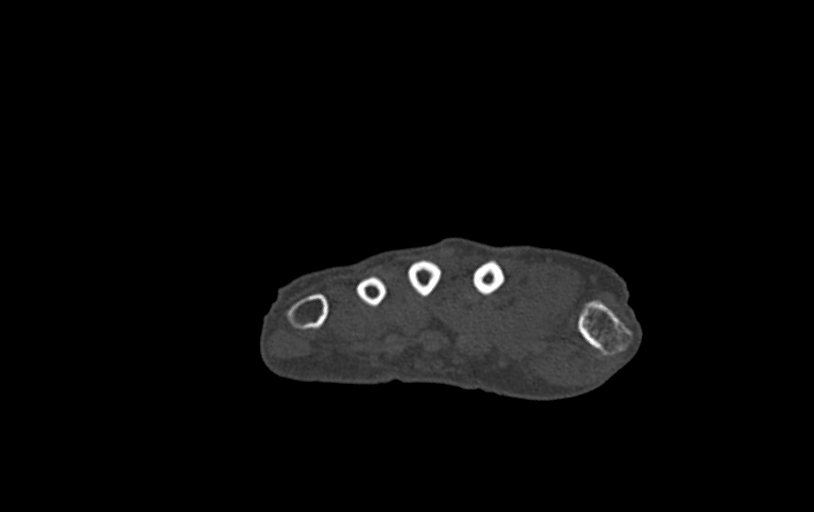
[im 341/370  soft-tissue]
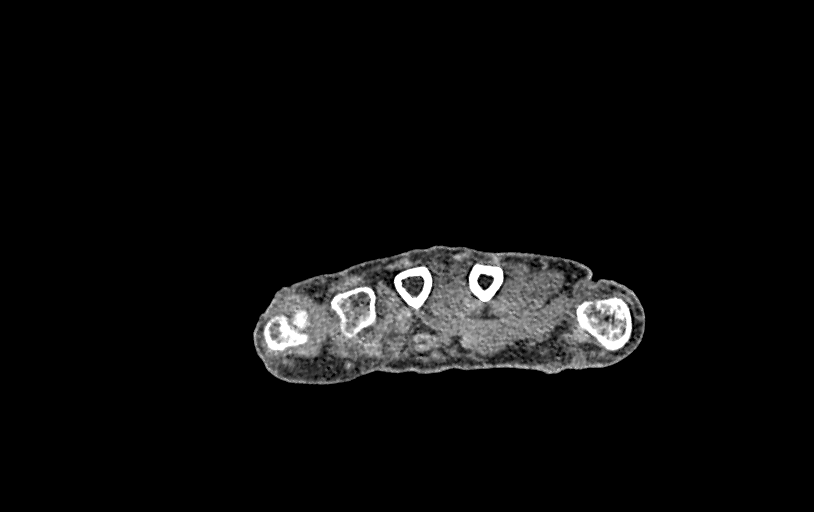
[im 341/370  bone]
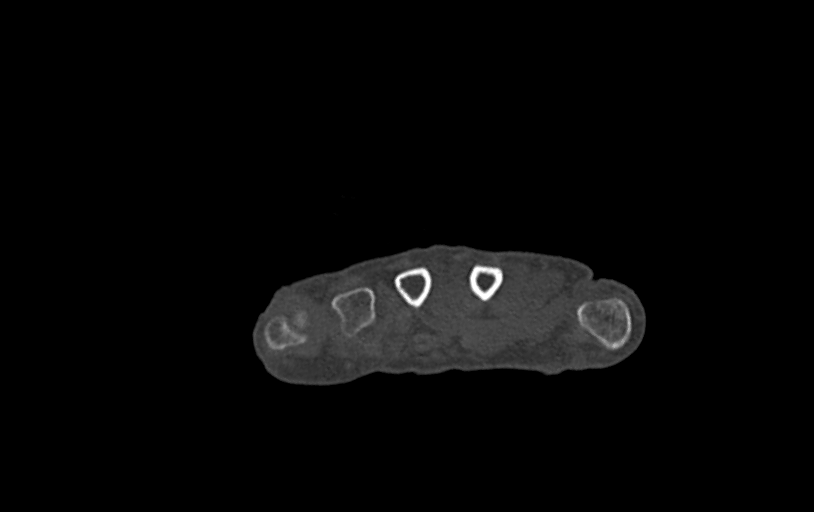

[9 of 14 positions shown; findings below may reference images not displayed]

FINDINGS: Volar plate and screws are in place for fixation of a comminuted
fracture of the left wrist. Hardware is intact without evidence of
loosening. There is a large gap at the articular surface eccentric
toward the ulna measuring 1.9 cm transverse by 1.8 cm AP by 0.6 cm
craniocaudal. Several screws traverse this gap in bone and are
uncovered.

There is a fragment over the dorsum of the wrist on the ulnar side
measuring 1.6 cm craniocaudal by 8 1.6 cm transverse by 0.7 cm AP
which has only the small focus of bridging bone along its radial
aspect. There is a screw localizing this fragment. The fracture
otherwise appears healed.

Bones are somewhat osteopenic. Mild first CMC osteoarthritis is
noted. Some soft mild soft tissue swelling is seen about the wrist.
No tendon tear is identified. As visualized, ligaments and the
triangular fibrocartilage appear intact.
IMPRESSION: Status post ORIF of a highly comminuted intra-articular fracture of
the distal radius. There is a large gap in the articular surface of
the radius with screws traversing the gap which are not covered by
bone.

The bulk of the patient's fracture appears healed. There is a
fragment along the dorsal and ulnar aspect of the wrist which is
predominantly a nonunion with only a small focus of bridging bone
seen radially. This fragment is localized by a screw.

## 2016-10-03 DIAGNOSIS — E785 Hyperlipidemia, unspecified: Secondary | ICD-10-CM | POA: Diagnosis not present

## 2016-10-03 DIAGNOSIS — I471 Supraventricular tachycardia: Secondary | ICD-10-CM | POA: Diagnosis not present

## 2016-10-03 DIAGNOSIS — E668 Other obesity: Secondary | ICD-10-CM | POA: Diagnosis not present

## 2016-10-03 DIAGNOSIS — G4733 Obstructive sleep apnea (adult) (pediatric): Secondary | ICD-10-CM | POA: Diagnosis not present

## 2016-10-03 DIAGNOSIS — I119 Hypertensive heart disease without heart failure: Secondary | ICD-10-CM | POA: Diagnosis not present

## 2016-10-30 DIAGNOSIS — H40053 Ocular hypertension, bilateral: Secondary | ICD-10-CM | POA: Diagnosis not present

## 2016-11-06 DIAGNOSIS — E7800 Pure hypercholesterolemia, unspecified: Secondary | ICD-10-CM | POA: Diagnosis not present

## 2016-11-06 DIAGNOSIS — F419 Anxiety disorder, unspecified: Secondary | ICD-10-CM | POA: Diagnosis not present

## 2016-11-06 DIAGNOSIS — E1142 Type 2 diabetes mellitus with diabetic polyneuropathy: Secondary | ICD-10-CM | POA: Diagnosis not present

## 2016-11-06 DIAGNOSIS — E1165 Type 2 diabetes mellitus with hyperglycemia: Secondary | ICD-10-CM | POA: Diagnosis not present

## 2016-11-06 DIAGNOSIS — Z6841 Body Mass Index (BMI) 40.0 and over, adult: Secondary | ICD-10-CM | POA: Diagnosis not present

## 2016-11-06 DIAGNOSIS — M19042 Primary osteoarthritis, left hand: Secondary | ICD-10-CM | POA: Diagnosis not present

## 2016-11-06 DIAGNOSIS — I10 Essential (primary) hypertension: Secondary | ICD-10-CM | POA: Diagnosis not present

## 2016-11-06 DIAGNOSIS — G4733 Obstructive sleep apnea (adult) (pediatric): Secondary | ICD-10-CM | POA: Diagnosis not present

## 2016-11-06 DIAGNOSIS — N400 Enlarged prostate without lower urinary tract symptoms: Secondary | ICD-10-CM | POA: Diagnosis not present

## 2016-11-06 DIAGNOSIS — E669 Obesity, unspecified: Secondary | ICD-10-CM | POA: Diagnosis not present

## 2016-11-06 DIAGNOSIS — K219 Gastro-esophageal reflux disease without esophagitis: Secondary | ICD-10-CM | POA: Diagnosis not present

## 2016-12-18 DIAGNOSIS — G4733 Obstructive sleep apnea (adult) (pediatric): Secondary | ICD-10-CM | POA: Diagnosis not present

## 2017-02-06 DIAGNOSIS — Z1389 Encounter for screening for other disorder: Secondary | ICD-10-CM | POA: Diagnosis not present

## 2017-02-06 DIAGNOSIS — M19041 Primary osteoarthritis, right hand: Secondary | ICD-10-CM | POA: Diagnosis not present

## 2017-02-06 DIAGNOSIS — F17200 Nicotine dependence, unspecified, uncomplicated: Secondary | ICD-10-CM | POA: Diagnosis not present

## 2017-02-06 DIAGNOSIS — G4733 Obstructive sleep apnea (adult) (pediatric): Secondary | ICD-10-CM | POA: Diagnosis not present

## 2017-02-06 DIAGNOSIS — M19042 Primary osteoarthritis, left hand: Secondary | ICD-10-CM | POA: Diagnosis not present

## 2017-02-06 DIAGNOSIS — E7800 Pure hypercholesterolemia, unspecified: Secondary | ICD-10-CM | POA: Diagnosis not present

## 2017-02-06 DIAGNOSIS — F419 Anxiety disorder, unspecified: Secondary | ICD-10-CM | POA: Diagnosis not present

## 2017-02-06 DIAGNOSIS — E1165 Type 2 diabetes mellitus with hyperglycemia: Secondary | ICD-10-CM | POA: Diagnosis not present

## 2017-02-06 DIAGNOSIS — N400 Enlarged prostate without lower urinary tract symptoms: Secondary | ICD-10-CM | POA: Diagnosis not present

## 2017-02-06 DIAGNOSIS — E1142 Type 2 diabetes mellitus with diabetic polyneuropathy: Secondary | ICD-10-CM | POA: Diagnosis not present

## 2017-02-06 DIAGNOSIS — I10 Essential (primary) hypertension: Secondary | ICD-10-CM | POA: Diagnosis not present

## 2017-02-06 DIAGNOSIS — K219 Gastro-esophageal reflux disease without esophagitis: Secondary | ICD-10-CM | POA: Diagnosis not present

## 2017-05-01 DIAGNOSIS — H524 Presbyopia: Secondary | ICD-10-CM | POA: Diagnosis not present

## 2017-05-01 DIAGNOSIS — H40053 Ocular hypertension, bilateral: Secondary | ICD-10-CM | POA: Diagnosis not present

## 2017-05-01 DIAGNOSIS — E119 Type 2 diabetes mellitus without complications: Secondary | ICD-10-CM | POA: Diagnosis not present

## 2017-05-01 DIAGNOSIS — H2513 Age-related nuclear cataract, bilateral: Secondary | ICD-10-CM | POA: Diagnosis not present

## 2017-05-17 DIAGNOSIS — Z125 Encounter for screening for malignant neoplasm of prostate: Secondary | ICD-10-CM | POA: Diagnosis not present

## 2017-05-17 DIAGNOSIS — E1165 Type 2 diabetes mellitus with hyperglycemia: Secondary | ICD-10-CM | POA: Diagnosis not present

## 2017-05-17 DIAGNOSIS — Z23 Encounter for immunization: Secondary | ICD-10-CM | POA: Diagnosis not present

## 2017-05-17 DIAGNOSIS — I10 Essential (primary) hypertension: Secondary | ICD-10-CM | POA: Diagnosis not present

## 2017-05-17 DIAGNOSIS — G4733 Obstructive sleep apnea (adult) (pediatric): Secondary | ICD-10-CM | POA: Diagnosis not present

## 2017-05-17 DIAGNOSIS — F17200 Nicotine dependence, unspecified, uncomplicated: Secondary | ICD-10-CM | POA: Diagnosis not present

## 2017-05-17 DIAGNOSIS — F419 Anxiety disorder, unspecified: Secondary | ICD-10-CM | POA: Diagnosis not present

## 2017-05-17 DIAGNOSIS — E7800 Pure hypercholesterolemia, unspecified: Secondary | ICD-10-CM | POA: Diagnosis not present

## 2017-05-17 DIAGNOSIS — K219 Gastro-esophageal reflux disease without esophagitis: Secondary | ICD-10-CM | POA: Diagnosis not present

## 2017-05-17 DIAGNOSIS — N400 Enlarged prostate without lower urinary tract symptoms: Secondary | ICD-10-CM | POA: Diagnosis not present

## 2017-10-09 DIAGNOSIS — E785 Hyperlipidemia, unspecified: Secondary | ICD-10-CM | POA: Diagnosis not present

## 2017-10-09 DIAGNOSIS — G4733 Obstructive sleep apnea (adult) (pediatric): Secondary | ICD-10-CM | POA: Diagnosis not present

## 2017-10-09 DIAGNOSIS — E668 Other obesity: Secondary | ICD-10-CM | POA: Diagnosis not present

## 2017-10-09 DIAGNOSIS — I471 Supraventricular tachycardia: Secondary | ICD-10-CM | POA: Diagnosis not present

## 2017-10-09 DIAGNOSIS — I119 Hypertensive heart disease without heart failure: Secondary | ICD-10-CM | POA: Diagnosis not present

## 2017-10-24 DIAGNOSIS — E785 Hyperlipidemia, unspecified: Secondary | ICD-10-CM | POA: Diagnosis not present

## 2017-10-30 DIAGNOSIS — H40053 Ocular hypertension, bilateral: Secondary | ICD-10-CM | POA: Diagnosis not present

## 2017-11-28 DIAGNOSIS — E7800 Pure hypercholesterolemia, unspecified: Secondary | ICD-10-CM | POA: Diagnosis not present

## 2017-11-28 DIAGNOSIS — K219 Gastro-esophageal reflux disease without esophagitis: Secondary | ICD-10-CM | POA: Diagnosis not present

## 2017-11-28 DIAGNOSIS — Z6839 Body mass index (BMI) 39.0-39.9, adult: Secondary | ICD-10-CM | POA: Diagnosis not present

## 2017-11-28 DIAGNOSIS — M19042 Primary osteoarthritis, left hand: Secondary | ICD-10-CM | POA: Diagnosis not present

## 2017-11-28 DIAGNOSIS — I10 Essential (primary) hypertension: Secondary | ICD-10-CM | POA: Diagnosis not present

## 2017-11-28 DIAGNOSIS — F419 Anxiety disorder, unspecified: Secondary | ICD-10-CM | POA: Diagnosis not present

## 2017-11-28 DIAGNOSIS — N400 Enlarged prostate without lower urinary tract symptoms: Secondary | ICD-10-CM | POA: Diagnosis not present

## 2017-11-28 DIAGNOSIS — F17200 Nicotine dependence, unspecified, uncomplicated: Secondary | ICD-10-CM | POA: Diagnosis not present

## 2017-11-28 DIAGNOSIS — E1142 Type 2 diabetes mellitus with diabetic polyneuropathy: Secondary | ICD-10-CM | POA: Diagnosis not present

## 2017-11-28 DIAGNOSIS — G4733 Obstructive sleep apnea (adult) (pediatric): Secondary | ICD-10-CM | POA: Diagnosis not present

## 2017-11-28 DIAGNOSIS — M19041 Primary osteoarthritis, right hand: Secondary | ICD-10-CM | POA: Diagnosis not present

## 2017-12-17 DIAGNOSIS — G4733 Obstructive sleep apnea (adult) (pediatric): Secondary | ICD-10-CM | POA: Diagnosis not present

## 2018-03-14 DIAGNOSIS — G4733 Obstructive sleep apnea (adult) (pediatric): Secondary | ICD-10-CM | POA: Diagnosis not present

## 2018-03-14 DIAGNOSIS — K219 Gastro-esophageal reflux disease without esophagitis: Secondary | ICD-10-CM | POA: Diagnosis not present

## 2018-03-14 DIAGNOSIS — N400 Enlarged prostate without lower urinary tract symptoms: Secondary | ICD-10-CM | POA: Diagnosis not present

## 2018-03-14 DIAGNOSIS — E7800 Pure hypercholesterolemia, unspecified: Secondary | ICD-10-CM | POA: Diagnosis not present

## 2018-03-14 DIAGNOSIS — E1142 Type 2 diabetes mellitus with diabetic polyneuropathy: Secondary | ICD-10-CM | POA: Diagnosis not present

## 2018-03-14 DIAGNOSIS — I10 Essential (primary) hypertension: Secondary | ICD-10-CM | POA: Diagnosis not present

## 2018-03-14 DIAGNOSIS — Z23 Encounter for immunization: Secondary | ICD-10-CM | POA: Diagnosis not present

## 2018-03-14 DIAGNOSIS — F17200 Nicotine dependence, unspecified, uncomplicated: Secondary | ICD-10-CM | POA: Diagnosis not present

## 2018-03-14 DIAGNOSIS — F419 Anxiety disorder, unspecified: Secondary | ICD-10-CM | POA: Diagnosis not present

## 2018-04-11 DIAGNOSIS — J209 Acute bronchitis, unspecified: Secondary | ICD-10-CM | POA: Diagnosis not present

## 2018-04-11 DIAGNOSIS — Z23 Encounter for immunization: Secondary | ICD-10-CM | POA: Diagnosis not present

## 2018-04-11 DIAGNOSIS — T148XXA Other injury of unspecified body region, initial encounter: Secondary | ICD-10-CM | POA: Diagnosis not present

## 2018-05-15 DIAGNOSIS — H524 Presbyopia: Secondary | ICD-10-CM | POA: Diagnosis not present

## 2018-05-15 DIAGNOSIS — H2513 Age-related nuclear cataract, bilateral: Secondary | ICD-10-CM | POA: Diagnosis not present

## 2018-05-15 DIAGNOSIS — H40053 Ocular hypertension, bilateral: Secondary | ICD-10-CM | POA: Diagnosis not present

## 2018-05-15 DIAGNOSIS — E119 Type 2 diabetes mellitus without complications: Secondary | ICD-10-CM | POA: Diagnosis not present

## 2018-05-28 DIAGNOSIS — E7800 Pure hypercholesterolemia, unspecified: Secondary | ICD-10-CM | POA: Diagnosis not present

## 2018-05-28 DIAGNOSIS — E1142 Type 2 diabetes mellitus with diabetic polyneuropathy: Secondary | ICD-10-CM | POA: Diagnosis not present

## 2018-05-28 DIAGNOSIS — Z125 Encounter for screening for malignant neoplasm of prostate: Secondary | ICD-10-CM | POA: Diagnosis not present

## 2018-05-28 DIAGNOSIS — I10 Essential (primary) hypertension: Secondary | ICD-10-CM | POA: Diagnosis not present

## 2018-05-30 DIAGNOSIS — I10 Essential (primary) hypertension: Secondary | ICD-10-CM | POA: Diagnosis not present

## 2018-05-30 DIAGNOSIS — F419 Anxiety disorder, unspecified: Secondary | ICD-10-CM | POA: Diagnosis not present

## 2018-05-30 DIAGNOSIS — E1165 Type 2 diabetes mellitus with hyperglycemia: Secondary | ICD-10-CM | POA: Diagnosis not present

## 2018-05-30 DIAGNOSIS — N400 Enlarged prostate without lower urinary tract symptoms: Secondary | ICD-10-CM | POA: Diagnosis not present

## 2018-05-30 DIAGNOSIS — F17200 Nicotine dependence, unspecified, uncomplicated: Secondary | ICD-10-CM | POA: Diagnosis not present

## 2018-05-30 DIAGNOSIS — E7800 Pure hypercholesterolemia, unspecified: Secondary | ICD-10-CM | POA: Diagnosis not present

## 2018-08-26 DIAGNOSIS — E1142 Type 2 diabetes mellitus with diabetic polyneuropathy: Secondary | ICD-10-CM | POA: Diagnosis not present

## 2018-08-29 DIAGNOSIS — I10 Essential (primary) hypertension: Secondary | ICD-10-CM | POA: Diagnosis not present

## 2018-08-29 DIAGNOSIS — F17210 Nicotine dependence, cigarettes, uncomplicated: Secondary | ICD-10-CM | POA: Diagnosis not present

## 2018-08-29 DIAGNOSIS — N400 Enlarged prostate without lower urinary tract symptoms: Secondary | ICD-10-CM | POA: Diagnosis not present

## 2018-08-29 DIAGNOSIS — F419 Anxiety disorder, unspecified: Secondary | ICD-10-CM | POA: Diagnosis not present

## 2018-08-29 DIAGNOSIS — G4733 Obstructive sleep apnea (adult) (pediatric): Secondary | ICD-10-CM | POA: Diagnosis not present

## 2018-08-29 DIAGNOSIS — Z7984 Long term (current) use of oral hypoglycemic drugs: Secondary | ICD-10-CM | POA: Diagnosis not present

## 2018-08-29 DIAGNOSIS — Z0000 Encounter for general adult medical examination without abnormal findings: Secondary | ICD-10-CM | POA: Diagnosis not present

## 2018-08-29 DIAGNOSIS — K219 Gastro-esophageal reflux disease without esophagitis: Secondary | ICD-10-CM | POA: Diagnosis not present

## 2018-08-29 DIAGNOSIS — E7800 Pure hypercholesterolemia, unspecified: Secondary | ICD-10-CM | POA: Diagnosis not present

## 2018-08-29 DIAGNOSIS — E1142 Type 2 diabetes mellitus with diabetic polyneuropathy: Secondary | ICD-10-CM | POA: Diagnosis not present

## 2018-09-17 ENCOUNTER — Ambulatory Visit: Payer: Medicare Other | Admitting: Cardiology

## 2018-10-10 ENCOUNTER — Telehealth: Payer: Self-pay

## 2018-10-10 NOTE — Telephone Encounter (Signed)
Left message for patient to call back about appt.

## 2018-10-10 NOTE — Telephone Encounter (Signed)
Patient called again

## 2018-10-10 NOTE — Telephone Encounter (Signed)
Follow up ° ° °Patient is returning your call. Please call patient. °

## 2018-10-10 NOTE — Telephone Encounter (Signed)
Patient returned your call.

## 2018-10-15 ENCOUNTER — Ambulatory Visit: Payer: Medicare Other | Admitting: Cardiovascular Disease

## 2019-01-08 DIAGNOSIS — G4733 Obstructive sleep apnea (adult) (pediatric): Secondary | ICD-10-CM | POA: Diagnosis not present

## 2019-01-08 DIAGNOSIS — R5383 Other fatigue: Secondary | ICD-10-CM | POA: Diagnosis not present

## 2019-01-14 ENCOUNTER — Telehealth: Payer: Self-pay

## 2019-01-14 NOTE — Telephone Encounter (Signed)
Spoke with pt and he would rather push his appt out until he can definitely be seen in office. Advised pt we will reach out to him to get him back on the schedule when we are certain we have in office availability. Pt denies any complications at this time. Advised pt to call and let us know if anything changes and we will get him in the office with one of our Drs as soon as possible.

## 2019-01-21 ENCOUNTER — Ambulatory Visit: Payer: Medicare Other | Admitting: Cardiovascular Disease

## 2019-02-25 DIAGNOSIS — E119 Type 2 diabetes mellitus without complications: Secondary | ICD-10-CM | POA: Diagnosis not present

## 2019-02-25 DIAGNOSIS — Z7984 Long term (current) use of oral hypoglycemic drugs: Secondary | ICD-10-CM | POA: Diagnosis not present

## 2019-02-25 DIAGNOSIS — E1142 Type 2 diabetes mellitus with diabetic polyneuropathy: Secondary | ICD-10-CM | POA: Diagnosis not present

## 2019-02-28 DIAGNOSIS — E1142 Type 2 diabetes mellitus with diabetic polyneuropathy: Secondary | ICD-10-CM | POA: Diagnosis not present

## 2019-02-28 DIAGNOSIS — G4733 Obstructive sleep apnea (adult) (pediatric): Secondary | ICD-10-CM | POA: Diagnosis not present

## 2019-02-28 DIAGNOSIS — E7800 Pure hypercholesterolemia, unspecified: Secondary | ICD-10-CM | POA: Diagnosis not present

## 2019-02-28 DIAGNOSIS — N400 Enlarged prostate without lower urinary tract symptoms: Secondary | ICD-10-CM | POA: Diagnosis not present

## 2019-02-28 DIAGNOSIS — F419 Anxiety disorder, unspecified: Secondary | ICD-10-CM | POA: Diagnosis not present

## 2019-02-28 DIAGNOSIS — K219 Gastro-esophageal reflux disease without esophagitis: Secondary | ICD-10-CM | POA: Diagnosis not present

## 2019-02-28 DIAGNOSIS — I10 Essential (primary) hypertension: Secondary | ICD-10-CM | POA: Diagnosis not present

## 2019-02-28 DIAGNOSIS — F17200 Nicotine dependence, unspecified, uncomplicated: Secondary | ICD-10-CM | POA: Diagnosis not present

## 2019-03-31 ENCOUNTER — Other Ambulatory Visit: Payer: Self-pay

## 2019-03-31 ENCOUNTER — Other Ambulatory Visit (HOSPITAL_COMMUNITY): Payer: Self-pay | Admitting: Family Medicine

## 2019-03-31 ENCOUNTER — Ambulatory Visit (HOSPITAL_COMMUNITY)
Admission: RE | Admit: 2019-03-31 | Discharge: 2019-03-31 | Disposition: A | Discharge status: Home / Self Care | Payer: Medicare Other | Source: Ambulatory Visit | Attending: Family Medicine | Admitting: Family Medicine

## 2019-03-31 DIAGNOSIS — M7989 Other specified soft tissue disorders: Secondary | ICD-10-CM | POA: Diagnosis not present

## 2019-03-31 DIAGNOSIS — R238 Other skin changes: Secondary | ICD-10-CM | POA: Insufficient documentation

## 2019-03-31 NOTE — Progress Notes (Signed)
RLE venous duplex       has been completed. Preliminary results can be found under CV proc through chart review. June Leap, BS, RDMS, RVT     Called results to Dr. Kenton Kingfisher' office

## 2019-04-01 DIAGNOSIS — M7989 Other specified soft tissue disorders: Secondary | ICD-10-CM | POA: Diagnosis not present

## 2019-04-01 DIAGNOSIS — Z23 Encounter for immunization: Secondary | ICD-10-CM | POA: Diagnosis not present

## 2019-04-02 DIAGNOSIS — E119 Type 2 diabetes mellitus without complications: Secondary | ICD-10-CM | POA: Diagnosis not present

## 2019-04-02 DIAGNOSIS — H524 Presbyopia: Secondary | ICD-10-CM | POA: Diagnosis not present

## 2019-04-02 DIAGNOSIS — H5703 Miosis: Secondary | ICD-10-CM | POA: Diagnosis not present

## 2019-04-02 DIAGNOSIS — H2513 Age-related nuclear cataract, bilateral: Secondary | ICD-10-CM | POA: Diagnosis not present

## 2019-04-02 DIAGNOSIS — H40033 Anatomical narrow angle, bilateral: Secondary | ICD-10-CM | POA: Diagnosis not present

## 2019-04-02 DIAGNOSIS — G4733 Obstructive sleep apnea (adult) (pediatric): Secondary | ICD-10-CM | POA: Diagnosis not present

## 2019-04-02 DIAGNOSIS — H04123 Dry eye syndrome of bilateral lacrimal glands: Secondary | ICD-10-CM | POA: Diagnosis not present

## 2019-04-02 DIAGNOSIS — H5203 Hypermetropia, bilateral: Secondary | ICD-10-CM | POA: Diagnosis not present

## 2019-04-02 DIAGNOSIS — H52223 Regular astigmatism, bilateral: Secondary | ICD-10-CM | POA: Diagnosis not present

## 2019-04-02 DIAGNOSIS — H40053 Ocular hypertension, bilateral: Secondary | ICD-10-CM | POA: Diagnosis not present

## 2019-04-02 DIAGNOSIS — Z8679 Personal history of other diseases of the circulatory system: Secondary | ICD-10-CM | POA: Diagnosis not present

## 2019-04-28 DIAGNOSIS — G4733 Obstructive sleep apnea (adult) (pediatric): Secondary | ICD-10-CM | POA: Diagnosis not present

## 2019-05-30 DIAGNOSIS — Z800 Family history of malignant neoplasm of digestive organs: Secondary | ICD-10-CM | POA: Diagnosis not present

## 2019-05-30 DIAGNOSIS — K921 Melena: Secondary | ICD-10-CM | POA: Diagnosis not present

## 2019-06-17 DIAGNOSIS — Z1159 Encounter for screening for other viral diseases: Secondary | ICD-10-CM | POA: Diagnosis not present

## 2019-08-05 DIAGNOSIS — Z1159 Encounter for screening for other viral diseases: Secondary | ICD-10-CM | POA: Diagnosis not present

## 2019-08-08 DIAGNOSIS — Z800 Family history of malignant neoplasm of digestive organs: Secondary | ICD-10-CM | POA: Diagnosis not present

## 2019-08-08 DIAGNOSIS — K621 Rectal polyp: Secondary | ICD-10-CM | POA: Diagnosis not present

## 2019-08-08 DIAGNOSIS — D123 Benign neoplasm of transverse colon: Secondary | ICD-10-CM | POA: Diagnosis not present

## 2019-08-08 DIAGNOSIS — Z1211 Encounter for screening for malignant neoplasm of colon: Secondary | ICD-10-CM | POA: Diagnosis not present

## 2019-08-12 DIAGNOSIS — K621 Rectal polyp: Secondary | ICD-10-CM | POA: Diagnosis not present

## 2019-08-12 DIAGNOSIS — D123 Benign neoplasm of transverse colon: Secondary | ICD-10-CM | POA: Diagnosis not present

## 2019-09-09 DIAGNOSIS — I10 Essential (primary) hypertension: Secondary | ICD-10-CM | POA: Diagnosis not present

## 2019-09-09 DIAGNOSIS — E1142 Type 2 diabetes mellitus with diabetic polyneuropathy: Secondary | ICD-10-CM | POA: Diagnosis not present

## 2019-09-09 DIAGNOSIS — E7800 Pure hypercholesterolemia, unspecified: Secondary | ICD-10-CM | POA: Diagnosis not present

## 2019-09-09 DIAGNOSIS — Z125 Encounter for screening for malignant neoplasm of prostate: Secondary | ICD-10-CM | POA: Diagnosis not present

## 2019-09-12 DIAGNOSIS — E1142 Type 2 diabetes mellitus with diabetic polyneuropathy: Secondary | ICD-10-CM | POA: Diagnosis not present

## 2019-09-12 DIAGNOSIS — K219 Gastro-esophageal reflux disease without esophagitis: Secondary | ICD-10-CM | POA: Diagnosis not present

## 2019-09-12 DIAGNOSIS — F17200 Nicotine dependence, unspecified, uncomplicated: Secondary | ICD-10-CM | POA: Diagnosis not present

## 2019-09-12 DIAGNOSIS — G4733 Obstructive sleep apnea (adult) (pediatric): Secondary | ICD-10-CM | POA: Diagnosis not present

## 2019-09-12 DIAGNOSIS — N400 Enlarged prostate without lower urinary tract symptoms: Secondary | ICD-10-CM | POA: Diagnosis not present

## 2019-09-12 DIAGNOSIS — Z6839 Body mass index (BMI) 39.0-39.9, adult: Secondary | ICD-10-CM | POA: Diagnosis not present

## 2019-09-12 DIAGNOSIS — Z0000 Encounter for general adult medical examination without abnormal findings: Secondary | ICD-10-CM | POA: Diagnosis not present

## 2019-09-12 DIAGNOSIS — I10 Essential (primary) hypertension: Secondary | ICD-10-CM | POA: Diagnosis not present

## 2019-09-12 DIAGNOSIS — F419 Anxiety disorder, unspecified: Secondary | ICD-10-CM | POA: Diagnosis not present

## 2019-09-12 DIAGNOSIS — E7800 Pure hypercholesterolemia, unspecified: Secondary | ICD-10-CM | POA: Diagnosis not present

## 2019-09-12 DIAGNOSIS — M19041 Primary osteoarthritis, right hand: Secondary | ICD-10-CM | POA: Diagnosis not present

## 2019-10-21 DIAGNOSIS — Z23 Encounter for immunization: Secondary | ICD-10-CM | POA: Diagnosis not present

## 2019-11-18 DIAGNOSIS — Z23 Encounter for immunization: Secondary | ICD-10-CM | POA: Diagnosis not present

## 2020-04-28 DIAGNOSIS — G4733 Obstructive sleep apnea (adult) (pediatric): Secondary | ICD-10-CM | POA: Diagnosis not present

## 2020-05-14 DIAGNOSIS — F17200 Nicotine dependence, unspecified, uncomplicated: Secondary | ICD-10-CM | POA: Diagnosis not present

## 2020-05-14 DIAGNOSIS — E7800 Pure hypercholesterolemia, unspecified: Secondary | ICD-10-CM | POA: Diagnosis not present

## 2020-05-14 DIAGNOSIS — F419 Anxiety disorder, unspecified: Secondary | ICD-10-CM | POA: Diagnosis not present

## 2020-05-14 DIAGNOSIS — G4733 Obstructive sleep apnea (adult) (pediatric): Secondary | ICD-10-CM | POA: Diagnosis not present

## 2020-05-14 DIAGNOSIS — Z7984 Long term (current) use of oral hypoglycemic drugs: Secondary | ICD-10-CM | POA: Diagnosis not present

## 2020-05-14 DIAGNOSIS — E1142 Type 2 diabetes mellitus with diabetic polyneuropathy: Secondary | ICD-10-CM | POA: Diagnosis not present

## 2020-05-14 DIAGNOSIS — N400 Enlarged prostate without lower urinary tract symptoms: Secondary | ICD-10-CM | POA: Diagnosis not present

## 2020-05-14 DIAGNOSIS — I10 Essential (primary) hypertension: Secondary | ICD-10-CM | POA: Diagnosis not present

## 2020-05-14 DIAGNOSIS — K219 Gastro-esophageal reflux disease without esophagitis: Secondary | ICD-10-CM | POA: Diagnosis not present

## 2020-05-14 DIAGNOSIS — Z23 Encounter for immunization: Secondary | ICD-10-CM | POA: Diagnosis not present

## 2020-07-30 DIAGNOSIS — H40033 Anatomical narrow angle, bilateral: Secondary | ICD-10-CM | POA: Diagnosis not present

## 2020-07-30 DIAGNOSIS — H2513 Age-related nuclear cataract, bilateral: Secondary | ICD-10-CM | POA: Diagnosis not present

## 2020-07-30 DIAGNOSIS — H04123 Dry eye syndrome of bilateral lacrimal glands: Secondary | ICD-10-CM | POA: Diagnosis not present

## 2020-07-30 DIAGNOSIS — E119 Type 2 diabetes mellitus without complications: Secondary | ICD-10-CM | POA: Diagnosis not present

## 2020-07-30 DIAGNOSIS — Z7984 Long term (current) use of oral hypoglycemic drugs: Secondary | ICD-10-CM | POA: Diagnosis not present

## 2020-09-16 DIAGNOSIS — I10 Essential (primary) hypertension: Secondary | ICD-10-CM | POA: Diagnosis not present

## 2020-09-16 DIAGNOSIS — R5383 Other fatigue: Secondary | ICD-10-CM | POA: Diagnosis not present

## 2020-09-16 DIAGNOSIS — Z0000 Encounter for general adult medical examination without abnormal findings: Secondary | ICD-10-CM | POA: Diagnosis not present

## 2020-09-16 DIAGNOSIS — F419 Anxiety disorder, unspecified: Secondary | ICD-10-CM | POA: Diagnosis not present

## 2020-09-16 DIAGNOSIS — E1165 Type 2 diabetes mellitus with hyperglycemia: Secondary | ICD-10-CM | POA: Diagnosis not present

## 2020-09-16 DIAGNOSIS — Z720 Tobacco use: Secondary | ICD-10-CM | POA: Diagnosis not present

## 2020-09-16 DIAGNOSIS — N400 Enlarged prostate without lower urinary tract symptoms: Secondary | ICD-10-CM | POA: Diagnosis not present

## 2020-09-16 DIAGNOSIS — E7800 Pure hypercholesterolemia, unspecified: Secondary | ICD-10-CM | POA: Diagnosis not present

## 2020-09-16 DIAGNOSIS — Z125 Encounter for screening for malignant neoplasm of prostate: Secondary | ICD-10-CM | POA: Diagnosis not present

## 2020-12-02 DIAGNOSIS — R0789 Other chest pain: Secondary | ICD-10-CM | POA: Diagnosis not present

## 2020-12-02 DIAGNOSIS — I471 Supraventricular tachycardia: Secondary | ICD-10-CM | POA: Diagnosis not present

## 2020-12-02 DIAGNOSIS — R61 Generalized hyperhidrosis: Secondary | ICD-10-CM | POA: Diagnosis not present

## 2020-12-20 DIAGNOSIS — I471 Supraventricular tachycardia: Secondary | ICD-10-CM | POA: Diagnosis not present

## 2020-12-20 DIAGNOSIS — F17200 Nicotine dependence, unspecified, uncomplicated: Secondary | ICD-10-CM | POA: Diagnosis not present

## 2020-12-20 DIAGNOSIS — I10 Essential (primary) hypertension: Secondary | ICD-10-CM | POA: Diagnosis not present

## 2020-12-20 DIAGNOSIS — E1165 Type 2 diabetes mellitus with hyperglycemia: Secondary | ICD-10-CM | POA: Diagnosis not present

## 2020-12-20 DIAGNOSIS — N5201 Erectile dysfunction due to arterial insufficiency: Secondary | ICD-10-CM | POA: Diagnosis not present

## 2020-12-20 DIAGNOSIS — E7800 Pure hypercholesterolemia, unspecified: Secondary | ICD-10-CM | POA: Diagnosis not present

## 2021-02-07 DIAGNOSIS — E7800 Pure hypercholesterolemia, unspecified: Secondary | ICD-10-CM | POA: Diagnosis not present

## 2021-02-07 DIAGNOSIS — I471 Supraventricular tachycardia: Secondary | ICD-10-CM | POA: Diagnosis not present

## 2021-02-07 DIAGNOSIS — M25562 Pain in left knee: Secondary | ICD-10-CM | POA: Diagnosis not present

## 2021-02-07 DIAGNOSIS — E1165 Type 2 diabetes mellitus with hyperglycemia: Secondary | ICD-10-CM | POA: Diagnosis not present

## 2021-02-07 DIAGNOSIS — F17200 Nicotine dependence, unspecified, uncomplicated: Secondary | ICD-10-CM | POA: Diagnosis not present

## 2021-02-07 DIAGNOSIS — I10 Essential (primary) hypertension: Secondary | ICD-10-CM | POA: Diagnosis not present

## 2021-02-07 DIAGNOSIS — K219 Gastro-esophageal reflux disease without esophagitis: Secondary | ICD-10-CM | POA: Diagnosis not present

## 2021-02-07 DIAGNOSIS — G8929 Other chronic pain: Secondary | ICD-10-CM | POA: Diagnosis not present

## 2021-02-25 DIAGNOSIS — U071 COVID-19: Secondary | ICD-10-CM | POA: Diagnosis not present

## 2021-03-28 DIAGNOSIS — G8929 Other chronic pain: Secondary | ICD-10-CM | POA: Diagnosis not present

## 2021-03-28 DIAGNOSIS — Z6841 Body Mass Index (BMI) 40.0 and over, adult: Secondary | ICD-10-CM | POA: Diagnosis not present

## 2021-03-28 DIAGNOSIS — E7800 Pure hypercholesterolemia, unspecified: Secondary | ICD-10-CM | POA: Diagnosis not present

## 2021-03-28 DIAGNOSIS — F419 Anxiety disorder, unspecified: Secondary | ICD-10-CM | POA: Diagnosis not present

## 2021-03-28 DIAGNOSIS — M25562 Pain in left knee: Secondary | ICD-10-CM | POA: Diagnosis not present

## 2021-03-28 DIAGNOSIS — M545 Low back pain, unspecified: Secondary | ICD-10-CM | POA: Diagnosis not present

## 2021-03-28 DIAGNOSIS — K219 Gastro-esophageal reflux disease without esophagitis: Secondary | ICD-10-CM | POA: Diagnosis not present

## 2021-03-28 DIAGNOSIS — F17200 Nicotine dependence, unspecified, uncomplicated: Secondary | ICD-10-CM | POA: Diagnosis not present

## 2021-03-28 DIAGNOSIS — E1165 Type 2 diabetes mellitus with hyperglycemia: Secondary | ICD-10-CM | POA: Diagnosis not present

## 2021-03-28 DIAGNOSIS — I10 Essential (primary) hypertension: Secondary | ICD-10-CM | POA: Diagnosis not present

## 2021-03-28 DIAGNOSIS — G4733 Obstructive sleep apnea (adult) (pediatric): Secondary | ICD-10-CM | POA: Diagnosis not present

## 2021-03-28 DIAGNOSIS — N400 Enlarged prostate without lower urinary tract symptoms: Secondary | ICD-10-CM | POA: Diagnosis not present

## 2021-03-30 ENCOUNTER — Other Ambulatory Visit: Payer: Self-pay

## 2021-03-30 ENCOUNTER — Ambulatory Visit (INDEPENDENT_AMBULATORY_CARE_PROVIDER_SITE_OTHER): Payer: Medicare Other | Admitting: Interventional Cardiology

## 2021-03-30 ENCOUNTER — Encounter: Payer: Self-pay | Admitting: Interventional Cardiology

## 2021-03-30 VITALS — BP 136/84 | HR 82 | Ht 72.0 in | Wt 279.0 lb

## 2021-03-30 DIAGNOSIS — E782 Mixed hyperlipidemia: Secondary | ICD-10-CM

## 2021-03-30 DIAGNOSIS — I471 Supraventricular tachycardia: Secondary | ICD-10-CM | POA: Diagnosis not present

## 2021-03-30 DIAGNOSIS — R072 Precordial pain: Secondary | ICD-10-CM

## 2021-03-30 DIAGNOSIS — E119 Type 2 diabetes mellitus without complications: Secondary | ICD-10-CM

## 2021-03-30 DIAGNOSIS — I10 Essential (primary) hypertension: Secondary | ICD-10-CM | POA: Diagnosis not present

## 2021-03-30 MED ORDER — METOPROLOL TARTRATE 100 MG PO TABS: ORAL_TABLET | ORAL | 0 refills | Status: DC

## 2021-03-30 NOTE — Progress Notes (Addendum)
Cardiology Office Note   Date:  03/30/2021   ID:  Tanner Garza, DOB 16-Feb-1946, MRN [id number]  PCP:  Shirline Frees, MD    No chief complaint on file.  Chest pressure  Wt Readings from Last 3 Encounters:  03/30/21 279 lb (126.6 kg)  07/23/15 272 lb (123.4 kg)  06/28/15 271 lb (122.9 kg)       History of Present Illness: Tanner Garza is a 75 y.o. male who is being seen today for the evaluation of palpitations at the request of Shirline Frees, MD.   He felt a rapid HR, has three episodes over several years.   A few days ago, he had palpitations, chest heavines and arm weakness.  The chest symptoms were new for him compared to his prior episodes.  He went to [facility] ER and was sent home.  HR was high, up to 190-200.  He spontaneously converted.  Troponin was reportedly negative.  A few years ago, he had a similar episode 5-6 years ago and received adenosine.   Had a stress test with Dr. Wynonia Lawman 5-6 years ago.   He has not been walking much lately.    Denies : Dizziness. Leg edema. Nitroglycerin use. Orthopnea. Palpitations. Paroxysmal nocturnal dyspnea. Shortness of breath. Syncope.     Past Medical History:  Diagnosis Date   Anxiety    Bell's palsy 09/2001   BPH (benign prostatic hyperplasia)    Closed fracture of left humerus 12/09/2014   Colon polyp    Complication of anesthesia    Slow to awaken   Dysrhythmia    2 "bouts of tachycardia   Eczema    Esophageal reflux    Fracture of left distal radius [id number]   Hardware complicating wound infection ([place])    Humerus fracture 12/09/2014   Hyperlipidemia    Hypertension    Laceration of middle finger of left hand without complication [id number]   OSA (obstructive sleep apnea)    uses cpap   Staphylococcal infection    SVT (supraventricular tachycardia) ([place])     Past Surgical History:  Procedure Laterality Date   APPENDECTOMY     CHOLECYSTECTOMY     COLONOSCOPY W/ POLYPECTOMY     EXTERNAL  FIXATION REMOVAL Left 07/23/2015   Procedure: REMOVAL EXTERNAL FIXATION LEFT ARM;  Surgeon: Altamese Phillips, MD;  Location: [place];  Service: Orthopedics;  Laterality: Left;   I & D EXTREMITY Left 12/09/2014   Procedure: IRRIGATION AND DEBRIDEMENT LEFT LONG FINGER;  Surgeon: Leandrew Koyanagi, MD;  Location: [place];  Service: Orthopedics;  Laterality: Left;   OPEN REDUCTION INTERNAL FIXATION (ORIF) DISTAL RADIAL FRACTURE Left 12/09/2014   Procedure: OPEN REDUCTION INTERNAL FIXATION (ORIF) DISTAL RADIAL FRACTURE;  Surgeon: Leandrew Koyanagi, MD;  Location: [place];  Service: Orthopedics;  Laterality: Left;   ORIF HUMERUS FRACTURE Left 12/09/2014   Procedure: OPEN REDUCTION INTERNAL FIXATION (ORIF) HUMERAL SHAFT FRACTURE;  Surgeon: Leandrew Koyanagi, MD;  Location: [place];  Service: Orthopedics;  Laterality: Left;   ORIF HUMERUS FRACTURE Left 04/20/2015   Procedure: OPEN REDUCTION INTERNAL FIXATION (ORIF) LEFT HUMERAL SHAFT NONUNION ATTEMPTED, REMOVAL OF PLATE and SCREWS APPLICATION OF EXTERNAL FIXATOR LEFT HUMERUS PLACEMENT OF ANTIBIOTIC BEADS;  Surgeon: Leandrew Koyanagi, MD;  Location: [place];  Service: Orthopedics;  Laterality: Left;   REPLANTATION THUMB Right      Current Outpatient Medications  Medication Sig Dispense Refill   ALPRAZolam (XANAX) 0.5 MG tablet Take 0.5 mg by mouth every morning.  amLODipine (NORVASC) 5 MG tablet Take 5 mg by mouth every morning.      Ascorbic Acid (VITAMIN C) 500 MG CHEW Chew 1 tablet (500 mg total) by mouth 2 (two) times daily. 84 tablet 0   aspirin 81 MG tablet Take 81 mg by mouth every evening.     diltiazem (DILACOR XR) 120 MG 24 hr capsule Take 120 mg by mouth daily.     dutasteride (AVODART) 0.5 MG capsule Take 0.5 mg by mouth daily with supper.      gabapentin (NEURONTIN) 300 MG capsule Take 300 mg by mouth daily with supper.      losartan (COZAAR) 100 MG tablet Take 100 mg by mouth daily with supper.     Lutein 6 MG TABS Take 6 mg by mouth daily with supper.      metFORMIN  (GLUCOPHAGE-XR) 500 MG 24 hr tablet Take 500 mg by mouth 3 (three) times daily.     metoprolol tartrate (LOPRESSOR) 100 MG tablet Take one tablet by mouth two hours prior to CT Scan 1 tablet 0   Multiple Vitamin (MULTIVITAMIN) tablet Take 1 tablet by mouth every morning.      OXYGEN-HELIUM IN Inhale into the lungs. CPAP     pantoprazole (PROTONIX) 40 MG tablet Take 40 mg by mouth daily with supper.      spironolactone (ALDACTONE) 25 MG tablet Take 25 mg by mouth every morning.      zinc sulfate 220 MG capsule Take 1 capsule (220 mg total) by mouth daily. 42 capsule 0   ondansetron (ZOFRAN ODT) 4 MG disintegrating tablet Take 1 tablet (4 mg total) by mouth every 8 (eight) hours as needed for nausea or vomiting. (Patient not taking: Reported on 03/30/2021) 30 tablet 2   rifampin (RIFADIN) 300 MG capsule Take 1 capsule (300 mg total) by mouth 2 (two) times daily. (Patient not taking: Reported on 03/30/2021) 60 capsule 0   No current facility-administered medications for this visit.    Allergies:   Atenolol, Chantix [varenicline], Hctz [hydrochlorothiazide], Other, and Iodine-131    Social History:  The patient  reports that he has been smoking cigarettes. He has a 22.50 pack-year smoking history. He has never used smokeless tobacco. He reports that he does not drink alcohol and does not use drugs.   Family History:  The patient's family history includes Hypertension in his father.    ROS:  Please see the history of present illness.   Otherwise, review of systems are positive for unable to stop smoking.   All other systems are reviewed and negative.    PHYSICAL EXAM: VS:  BP 136/84   Pulse 82   Ht 6' (1.829 m)   Wt 279 lb (126.6 kg)   SpO2 95%   BMI 37.84 kg/m  , BMI Body mass index is 37.84 kg/m. GEN: Well nourished, well developed, in no acute distress HEENT: normal Neck: no JVD, carotid bruits, or masses Cardiac: RRR; no murmurs, rubs, or gallops,no edema  Respiratory:  clear to  auscultation bilaterally, normal work of breathing GI: soft, nontender, nondistended, + BS MS: no deformity or atrophy Skin: warm and dry, no rash Neuro:  Strength and sensation are intact Psych: euthymic mood, full affect   EKG:   The ekg ordered today demonstrates NSR, no ST changes   Recent Labs: No results found for requested labs within last 8760 hours.   Lipid Panel No results found for: CHOL, TRIG, HDL, CHOLHDL, VLDL, LDLCALC, LDLDIRECT   Other  studies Reviewed: Additional studies/ records that were reviewed today with results demonstrating: records from [facility] 1.02    ASSESSMENT AND PLAN:  SVT: Per patient, not AFib.  Has had episodes over the past few years.  With sx of precordial chest pain and arm weakness.  Plan for CTA coronaries.  Need to obtain tracings from [facility] as well.  Continue diltiazem.  If symptoms resolve, no further testing needed for his arrhythmia.  If he has further symptoms, may need to consider repeat monitor and referral to EP for possible ablation. Hyperlipidemia:  Whole food, plant-based diet.  I talked about the importance of preventive therapy including regular exercise. Obesity: He needs to try to lose weight as well. DM: A1C 7.6 IN 09/2020.  Diet and exercise target as noted below.   Tobacco abuse: < 1 ppd.  He needs to avoid all tobacco products.   Current medicines are reviewed at length with the patient today.  The patient concerns regarding his medicines were addressed.  The following changes have been made:  No change  Labs/ tests ordered today include:   Orders Placed This Encounter  Procedures   CT CORONARY MORPH W/CTA COR W/SCORE W/CA W/CM &/OR WO/CM   Basic Metabolic Panel (BMET)   EKG 12-Lead    Recommend 150 minutes/week of aerobic exercise Low fat, low carb, high fiber diet recommended  Disposition:   FU bsed on CT results   Signed, Larae Grooms, MD  03/30/2021 12:49 PM    [facility]  [facility] [address], [place], [place]  [address] Phone: [phone]; Fax: [phone]

## 2021-03-30 NOTE — H&P (View-Only) (Signed)
Cardiology Office Note   Date:  03/30/2021   ID:  Tanner Garza, DOB 1945/08/09, MRN [id number]  PCP:  Tanner Frees, MD    No chief complaint on file.  Chest pressure  Wt Readings from Last 3 Encounters:  03/30/21 279 lb (126.6 kg)  07/23/15 272 lb (123.4 kg)  06/28/15 271 lb (122.9 kg)       History of Present Illness: Tanner Garza is a 75 y.o. male who is being seen today for the evaluation of palpitations at the request of Tanner Frees, MD.   He felt a rapid HR, has three episodes over several years.   A few days ago, he had palpitations, chest heavines and arm weakness.  The chest symptoms were new for him compared to his prior episodes.  He went to [facility] ER and was sent home.  HR was high, up to 190-200.  He spontaneously converted.  Troponin was reportedly negative.  A few years ago, he had a similar episode 5-6 years ago and received adenosine.   Had a stress test with Dr. Wynonia Garza 5-6 years ago.   He has not been walking much lately.    Denies : Dizziness. Leg edema. Nitroglycerin use. Orthopnea. Palpitations. Paroxysmal nocturnal dyspnea. Shortness of breath. Syncope.     Past Medical History:  Diagnosis Date   Anxiety    Bell's palsy 09/2001   BPH (benign prostatic hyperplasia)    Closed fracture of left humerus 12/09/2014   Colon polyp    Complication of anesthesia    Slow to awaken   Dysrhythmia    2 "bouts of tachycardia   Eczema    Esophageal reflux    Fracture of left distal radius [id number]   Hardware complicating wound infection ([place])    Humerus fracture 12/09/2014   Hyperlipidemia    Hypertension    Laceration of middle finger of left hand without complication [id number]   OSA (obstructive sleep apnea)    uses cpap   Staphylococcal infection    SVT (supraventricular tachycardia) ([place])     Past Surgical History:  Procedure Laterality Date   APPENDECTOMY     CHOLECYSTECTOMY     COLONOSCOPY W/ POLYPECTOMY     EXTERNAL  FIXATION REMOVAL Left 07/23/2015   Procedure: REMOVAL EXTERNAL FIXATION LEFT ARM;  Surgeon: Altamese Eastlake, MD;  Location: [place];  Service: Orthopedics;  Laterality: Left;   I & D EXTREMITY Left 12/09/2014   Procedure: IRRIGATION AND DEBRIDEMENT LEFT LONG FINGER;  Surgeon: Leandrew Koyanagi, MD;  Location: [facility];  Service: Orthopedics;  Laterality: Left;   OPEN REDUCTION INTERNAL FIXATION (ORIF) DISTAL RADIAL FRACTURE Left 12/09/2014   Procedure: OPEN REDUCTION INTERNAL FIXATION (ORIF) DISTAL RADIAL FRACTURE;  Surgeon: Leandrew Koyanagi, MD;  Location: [facility];  Service: Orthopedics;  Laterality: Left;   ORIF HUMERUS FRACTURE Left 12/09/2014   Procedure: OPEN REDUCTION INTERNAL FIXATION (ORIF) HUMERAL SHAFT FRACTURE;  Surgeon: Leandrew Koyanagi, MD;  Location: [facility];  Service: Orthopedics;  Laterality: Left;   ORIF HUMERUS FRACTURE Left 04/20/2015   Procedure: OPEN REDUCTION INTERNAL FIXATION (ORIF) LEFT HUMERAL SHAFT NONUNION ATTEMPTED, REMOVAL OF PLATE and SCREWS APPLICATION OF EXTERNAL FIXATOR LEFT HUMERUS PLACEMENT OF ANTIBIOTIC BEADS;  Surgeon: Leandrew Koyanagi, MD;  Location: [facility];  Service: Orthopedics;  Laterality: Left;   REPLANTATION THUMB Right      Current Outpatient Medications  Medication Sig Dispense Refill   ALPRAZolam (XANAX) 0.5 MG tablet Take 0.5 mg by mouth every morning.  amLODipine (NORVASC) 5 MG tablet Take 5 mg by mouth every morning.      Ascorbic Acid (VITAMIN C) 500 MG CHEW Chew 1 tablet (500 mg total) by mouth 2 (two) times daily. 84 tablet 0   aspirin 81 MG tablet Take 81 mg by mouth every evening.     diltiazem (DILACOR XR) 120 MG 24 hr capsule Take 120 mg by mouth daily.     dutasteride (AVODART) 0.5 MG capsule Take 0.5 mg by mouth daily with supper.      gabapentin (NEURONTIN) 300 MG capsule Take 300 mg by mouth daily with supper.      losartan (COZAAR) 100 MG tablet Take 100 mg by mouth daily with supper.     Lutein 6 MG TABS Take 6 mg by mouth daily with supper.      metFORMIN  (GLUCOPHAGE-XR) 500 MG 24 hr tablet Take 500 mg by mouth 3 (three) times daily.     metoprolol tartrate (LOPRESSOR) 100 MG tablet Take one tablet by mouth two hours prior to CT Scan 1 tablet 0   Multiple Vitamin (MULTIVITAMIN) tablet Take 1 tablet by mouth every morning.      OXYGEN-HELIUM IN Inhale into the lungs. CPAP     pantoprazole (PROTONIX) 40 MG tablet Take 40 mg by mouth daily with supper.      spironolactone (ALDACTONE) 25 MG tablet Take 25 mg by mouth every morning.      zinc sulfate 220 MG capsule Take 1 capsule (220 mg total) by mouth daily. 42 capsule 0   ondansetron (ZOFRAN ODT) 4 MG disintegrating tablet Take 1 tablet (4 mg total) by mouth every 8 (eight) hours as needed for nausea or vomiting. (Patient not taking: Reported on 03/30/2021) 30 tablet 2   rifampin (RIFADIN) 300 MG capsule Take 1 capsule (300 mg total) by mouth 2 (two) times daily. (Patient not taking: Reported on 03/30/2021) 60 capsule 0   No current facility-administered medications for this visit.    Allergies:   Atenolol, Chantix [varenicline], Hctz [hydrochlorothiazide], Other, and Iodine-131    Social History:  The patient  reports that he has been smoking cigarettes. He has a 22.50 pack-year smoking history. He has never used smokeless tobacco. He reports that he does not drink alcohol and does not use drugs.   Family History:  The patient's family history includes Hypertension in his father.    ROS:  Please see the history of present illness.   Otherwise, review of systems are positive for unable to stop smoking.   All other systems are reviewed and negative.    PHYSICAL EXAM: VS:  BP 136/84   Pulse 82   Ht 6' (1.829 m)   Wt 279 lb (126.6 kg)   SpO2 95%   BMI 37.84 kg/m  , BMI Body mass index is 37.84 kg/m. GEN: Well nourished, well developed, in no acute distress HEENT: normal Neck: no JVD, carotid bruits, or masses Cardiac: RRR; no murmurs, rubs, or gallops,no edema  Respiratory:  clear to  auscultation bilaterally, normal work of breathing GI: soft, nontender, nondistended, + BS MS: no deformity or atrophy Skin: warm and dry, no rash Neuro:  Strength and sensation are intact Psych: euthymic mood, full affect   EKG:   The ekg ordered today demonstrates NSR, no ST changes   Recent Labs: No results found for requested labs within last 8760 hours.   Lipid Panel No results found for: CHOL, TRIG, HDL, CHOLHDL, VLDL, LDLCALC, LDLDIRECT   Other  studies Reviewed: Additional studies/ records that were reviewed today with results demonstrating: records from [facility] 1.02    ASSESSMENT AND PLAN:  SVT: Per patient, not AFib.  Has had episodes over the past few years.  With sx of precordial chest pain and arm weakness.  Plan for CTA coronaries.  Need to obtain tracings from [facility] as well.  Continue diltiazem.  If symptoms resolve, no further testing needed for his arrhythmia.  If he has further symptoms, may need to consider repeat monitor and referral to EP for possible ablation. Hyperlipidemia:  Whole food, plant-based diet.  I talked about the importance of preventive therapy including regular exercise. Obesity: He needs to try to lose weight as well. DM: A1C 7.6 IN 09/2020.  Diet and exercise target as noted below.   Tobacco abuse: < 1 ppd.  He needs to avoid all tobacco products.   Current medicines are reviewed at length with the patient today.  The patient concerns regarding his medicines were addressed.  The following changes have been made:  No change  Labs/ tests ordered today include:   Orders Placed This Encounter  Procedures   CT CORONARY MORPH W/CTA COR W/SCORE W/CA W/CM &/OR WO/CM   Basic Metabolic Panel (BMET)   EKG 12-Lead    Recommend 150 minutes/week of aerobic exercise Low fat, low carb, high fiber diet recommended  Disposition:   FU bsed on CT results   Signed, Larae Grooms, MD  03/30/2021 12:49 PM    [facility], [place], [place]  [address] Phone: [phone]; Fax: [phone]

## 2021-03-30 NOTE — Patient Instructions (Addendum)
Medication Instructions:  Your physician recommends that you continue on your current medications as directed. Please refer to the Current Medication list given to you today.  *If you need a refill on your cardiac medications before your next appointment, please call your pharmacy*   Lab Work: Your physician recommends that you return for lab work --BMP.  Please call to schedule appointment once Cardiac CT has been scheduled.   If you have labs (blood work) drawn today and your tests are completely normal, you will receive your results only by: [place] (if you have MyChart) OR A paper copy in the mail If you have any lab test that is abnormal or we need to change your treatment, we will call you to review the results.   Testing/Procedures: Your physician has requested that you have cardiac CT. Cardiac computed tomography (CT) is a painless test that uses an x-ray machine to take clear, detailed pictures of your heart. For further information please visit [email or web address]. Please follow instruction sheet as given.     Follow-Up: At [facility], you and your health needs are our priority.  As part of our continuing mission to provide you with exceptional heart care, we have created designated Provider Care Teams.  These Care Teams include your primary Cardiologist (physician) and Advanced Practice Providers (APPs -  Physician Assistants and Nurse Practitioners) who all work together to provide you with the care you need, when you need it.  We recommend signing up for the patient portal called "MyChart".  Sign up information is provided on this After Visit Summary.  MyChart is used to connect with patients for Virtual Visits (Telemedicine).  Patients are able to view lab/test results, encounter notes, upcoming appointments, etc.  Non-urgent messages can be sent to your provider as well.   To learn more about what you can do with MyChart, go to [email or web address].    Your  next appointment:   March 15,2023 at 9:20  The format for your next appointment:   In Person  Provider:   Casandra Doffing, MD   Other Instructions     Your cardiac CT will be scheduled at one of the below locations:   [facility] [address] [place], [place] [address] [phone]  [place] [address] [place], [place] [address] [phone]  If scheduled at [facility], please arrive at the [facility] main entrance (entrance A) of [facility] 30 minutes prior to test start time. Proceed to the [facility] Radiology Department (first floor) to check-in and test prep.  If scheduled at [facility], please arrive 15 mins early for check-in and test prep.  Please follow these instructions carefully (unless otherwise directed):  Hold all erectile dysfunction medications at least 3 days (72 hrs) prior to test.  On the Night Before the Test: Be sure to Drink plenty of water. Do not consume any caffeinated/decaffeinated beverages or chocolate 12 hours prior to your test. Do not take any antihistamines 12 hours prior to your test.   On the Day of the Test: Drink plenty of water until 1 hour prior to the test. Do not eat any food 4 hours prior to the test. You may take your regular medications prior to the test.  Take metoprolol (Lopressor) two hours prior to test. HOLD Furosemide/Hydrochlorothiazide morning of the test.           After the Test: Drink plenty of water.  After receiving IV contrast, you may experience a mild flushed feeling. This is normal. On occasion, you may experience a mild rash up to 24 hours after the test. This is not dangerous. If this occurs, you can take Benadryl 25 mg and increase your fluid intake. If you experience trouble breathing, this can be serious. If it is severe call 911 IMMEDIATELY. If it is mild, please call our  office. If you take any of these medications: Glipizide/Metformin, Avandament, Glucavance, please do not take 48 hours after completing test unless otherwise instructed.  Please allow 2-4 weeks for scheduling of routine cardiac CTs. Some insurance companies require a pre-authorization which may delay scheduling of this test.   For non-scheduling related questions, please contact the cardiac imaging nurse navigator should you have any questions/concerns: Marchia Bond, Cardiac Imaging Nurse Navigator Gordy Clement, Cardiac Imaging Nurse Navigator [facility] Direct Office Dial: [phone]   For scheduling needs, including cancellations and rescheduling, please call [place], [phone].

## 2021-04-18 ENCOUNTER — Telehealth (HOSPITAL_COMMUNITY): Payer: Self-pay | Admitting: *Deleted

## 2021-04-18 ENCOUNTER — Other Ambulatory Visit (HOSPITAL_COMMUNITY): Payer: Self-pay | Admitting: *Deleted

## 2021-04-18 DIAGNOSIS — I471 Supraventricular tachycardia: Secondary | ICD-10-CM

## 2021-04-18 DIAGNOSIS — R072 Precordial pain: Secondary | ICD-10-CM

## 2021-04-18 NOTE — Telephone Encounter (Signed)
Patient's wife returning call regarding upcoming cardiac imaging study; pt's wife verbalizes understanding of appt date/time, parking situation and where to check in, pre-test NPO status and medications ordered, and verified current allergies; name and call back number provided for further questions should they arise  [facility] [phone] office [phone] cell  Patient to take daily cardizem along with 100mg  metoprolol tartrate two hours prior to test.  Pt's wife is aware that he will need blood work prior to test. Patient is claustrophobic and will plan on taking his Xanax with wife driving patient to appointment.

## 2021-04-18 NOTE — Telephone Encounter (Signed)
Attempted to call patient regarding upcoming cardiac CT appointment and to get blood work prior to appointment. Left message on voicemail with name and callback number  Gordy Clement RN Navigator Cardiac [facility] [phone] Office [phone] Cell

## 2021-04-19 LAB — BASIC METABOLIC PANEL
BUN/Creatinine Ratio: 20 (ref 10–24)
BUN: 18 mg/dL (ref 8–27)
CO2: 25 mmol/L (ref 20–29)
Calcium: 9.9 mg/dL (ref 8.6–10.2)
Chloride: 99 mmol/L (ref 96–106)
Creatinine, Ser: 0.92 mg/dL (ref 0.76–1.27)
Glucose: 176 mg/dL — ABNORMAL HIGH (ref 70–99)
Potassium: 5.1 mmol/L (ref 3.5–5.2)
Sodium: 138 mmol/L (ref 134–144)
eGFR: 87 mL/min/{1.73_m2} (ref >59)

## 2021-04-20 ENCOUNTER — Ambulatory Visit (HOSPITAL_COMMUNITY)
Admission: RE | Admit: 2021-04-20 | Discharge: 2021-04-20 | Disposition: A | Discharge status: Home / Self Care | Payer: Medicare Other | Source: Ambulatory Visit | Attending: Interventional Cardiology | Admitting: Interventional Cardiology

## 2021-04-20 ENCOUNTER — Encounter (HOSPITAL_COMMUNITY): Payer: Self-pay

## 2021-04-20 ENCOUNTER — Other Ambulatory Visit: Payer: Self-pay

## 2021-04-20 ENCOUNTER — Other Ambulatory Visit: Payer: Self-pay | Admitting: Cardiology

## 2021-04-20 ENCOUNTER — Ambulatory Visit (HOSPITAL_COMMUNITY)
Admission: RE | Admit: 2021-04-20 | Discharge: 2021-04-20 | Disposition: A | Discharge status: Home / Self Care | Payer: Medicare Other | Source: Ambulatory Visit | Attending: Cardiology | Admitting: Cardiology

## 2021-04-20 DIAGNOSIS — R931 Abnormal findings on diagnostic imaging of heart and coronary circulation: Secondary | ICD-10-CM | POA: Insufficient documentation

## 2021-04-20 DIAGNOSIS — R072 Precordial pain: Secondary | ICD-10-CM | POA: Diagnosis not present

## 2021-04-20 DIAGNOSIS — I2510 Atherosclerotic heart disease of native coronary artery without angina pectoris: Secondary | ICD-10-CM | POA: Diagnosis not present

## 2021-04-20 MED ORDER — NITROGLYCERIN 0.4 MG SL SUBL
0.8000 mg | SUBLINGUAL_TABLET | Freq: Once | SUBLINGUAL | Status: AC
  Administered 2021-04-20: 0.8 mg via SUBLINGUAL

## 2021-04-20 MED ORDER — NITROGLYCERIN 0.4 MG SL SUBL
SUBLINGUAL_TABLET | SUBLINGUAL | Status: AC
  Filled 2021-04-20: qty 2

## 2021-04-20 MED ORDER — IOHEXOL 350 MG/ML SOLN
95.0000 mL | Freq: Once | INTRAVENOUS | Status: AC | PRN
  Administered 2021-04-20: 95 mL via INTRAVENOUS

## 2021-04-20 MED ORDER — METOPROLOL TARTRATE 5 MG/5ML IV SOLN: 5.0000 mg | INTRAVENOUS | Status: DC | PRN

## 2021-04-20 MED ORDER — METOPROLOL TARTRATE 5 MG/5ML IV SOLN
INTRAVENOUS | Status: AC
  Administered 2021-04-20: 2.5 mg via INTRAVENOUS
  Filled 2021-04-20: qty 5

## 2021-04-21 ENCOUNTER — Ambulatory Visit: Payer: Medicare Other

## 2021-04-21 ENCOUNTER — Telehealth: Payer: Self-pay | Admitting: Interventional Cardiology

## 2021-04-21 DIAGNOSIS — R931 Abnormal findings on diagnostic imaging of heart and coronary circulation: Secondary | ICD-10-CM

## 2021-04-21 DIAGNOSIS — I471 Supraventricular tachycardia: Secondary | ICD-10-CM

## 2021-04-21 DIAGNOSIS — Z01812 Encounter for preprocedural laboratory examination: Secondary | ICD-10-CM

## 2021-04-21 DIAGNOSIS — I2510 Atherosclerotic heart disease of native coronary artery without angina pectoris: Secondary | ICD-10-CM | POA: Diagnosis not present

## 2021-04-21 MED ORDER — DILTIAZEM HCL ER COATED BEADS 240 MG PO CP24: 240.0000 mg | ORAL_CAPSULE | Freq: Every day | ORAL | 3 refills | Status: DC

## 2021-04-21 MED ORDER — LOSARTAN POTASSIUM 50 MG PO TABS: 50.0000 mg | ORAL_TABLET | Freq: Every day | ORAL | 3 refills | Status: DC

## 2021-04-21 NOTE — Addendum Note (Signed)
Addended by: Aris Georgia, Bradley Bostelman L on: 04/21/2021 04:50 PM   Modules accepted: Orders

## 2021-04-21 NOTE — Telephone Encounter (Signed)
Pt had episode of svt and very clamy 200 pulse during episode . Wife states that episodes are becoming more frequent and would like to know next steps.. please advise

## 2021-04-21 NOTE — Telephone Encounter (Signed)
Patient's wife complaining of patient having SVT this morning that lasted an hour. Patient was very clammy and pulse was up to 200 during episode per wife. Patient's wife also saw CT results and was asking about Cardiac Cath. Informed her that results have been sent off to get FFR done. Patient's wife stated they have a big trip scheduled at the end of October and beginning of November, and if these plans need to be changed, she would like to know now, so she will have time to cancel trip. Will send message to Dr. Irish Lack to see if he wants to order  repeat monitor and referral to EP. Also, does he want to have patient see him to see about setting up for heart cath, or wait for FFR. Encouraged patient's wife if patient has anymore episodes or chest pain to go to the ED.

## 2021-04-21 NOTE — Progress Notes (Unsigned)
Enrolled patient for a 14 day Zio XT  monitor to be mailed to patients home  °

## 2021-04-21 NOTE — Telephone Encounter (Signed)
Per Dr. Irish Lack, Severe CAD noted, worst in circumflex distribution. Plan for cardiac cath.  Patient has trip planned at end of month so can schedule cath for next week. OK to do 2 week Zio patch as well after cath. Can increase to diltiazem 240 mg daily until the cath. Decrease losartan to 50 mg daily to avoid low BP.   Called patient and his wife about Dr. Hassell Done recommendations. Patient will have heart cath on 04/26/21 with Dr. Irish Lack. Informed patient of instructions for cath and sent instructions through [place]. Informed patient that a zio patch monitor will be mailed to him and to wear monitor after heart cath. Will send new prescriptions in for diltiazem 240 mg and losartan 50 mg daily. Updated medication list. Patient and his wife verbalized understanding.

## 2021-04-22 DIAGNOSIS — Z01812 Encounter for preprocedural laboratory examination: Secondary | ICD-10-CM | POA: Diagnosis not present

## 2021-04-22 DIAGNOSIS — I471 Supraventricular tachycardia: Secondary | ICD-10-CM | POA: Diagnosis not present

## 2021-04-22 DIAGNOSIS — R931 Abnormal findings on diagnostic imaging of heart and coronary circulation: Secondary | ICD-10-CM | POA: Diagnosis not present

## 2021-04-22 LAB — CBC WITH DIFFERENTIAL/PLATELET
Basophils Absolute: 0.1 10*3/uL (ref 0.0–0.2)
Basos: 1 %
EOS (ABSOLUTE): 0.1 10*3/uL (ref 0.0–0.4)
Eos: 2 %
Hematocrit: 44.2 % (ref 37.5–51.0)
Hemoglobin: 15.2 g/dL (ref 13.0–17.7)
Immature Grans (Abs): 0 10*3/uL (ref 0.0–0.1)
Immature Granulocytes: 0 %
Lymphocytes Absolute: 0.8 10*3/uL (ref 0.7–3.1)
Lymphs: 14 %
MCH: 33 pg (ref 26.6–33.0)
MCHC: 34.4 g/dL (ref 31.5–35.7)
MCV: 96 fL (ref 79–97)
Monocytes Absolute: 0.4 10*3/uL (ref 0.1–0.9)
Monocytes: 7 %
Neutrophils Absolute: 4.3 10*3/uL (ref 1.4–7.0)
Neutrophils: 76 %
Platelets: 181 10*3/uL (ref 150–450)
RBC: 4.6 x10E6/uL (ref 4.14–5.80)
RDW: 13.1 % (ref 11.6–15.4)
WBC: 5.8 10*3/uL (ref 3.4–10.8)

## 2021-04-22 LAB — BASIC METABOLIC PANEL
BUN/Creatinine Ratio: 17 (ref 10–24)
BUN: 18 mg/dL (ref 8–27)
CO2: 22 mmol/L (ref 20–29)
Calcium: 9.5 mg/dL (ref 8.6–10.2)
Chloride: 98 mmol/L (ref 96–106)
Creatinine, Ser: 1.07 mg/dL (ref 0.76–1.27)
Glucose: 228 mg/dL — ABNORMAL HIGH (ref 70–99)
Potassium: 4.2 mmol/L (ref 3.5–5.2)
Sodium: 138 mmol/L (ref 134–144)
eGFR: 73 mL/min/{1.73_m2} (ref >59)

## 2021-04-25 ENCOUNTER — Telehealth: Payer: Self-pay | Admitting: *Deleted

## 2021-04-25 DIAGNOSIS — I471 Supraventricular tachycardia: Secondary | ICD-10-CM

## 2021-04-25 NOTE — Telephone Encounter (Signed)
I checked with monitor department and patient should mail monitor to monitor company in the blue box with prepaid postage on it. Patient notified.

## 2021-04-25 NOTE — Telephone Encounter (Signed)
Cardiac catheterization scheduled at [facility] for: Tuesday April 26, 2021 7:30 [facility] Main Entrance A [facility]) at: 5:30 AM   No solid food after midnight prior to cath, clear liquids until 5 AM day of procedure.  CONTRAST ALLERGY: no-pt reports no reaction to contrast used for coronary CT 04/20/21  Medication instructions: Hold: Metformin-day of procedure and 48 hours post procedure Spironolactone-AM of procedure  Except hold medications usual morning medications can be taken pre-cath with sips of water including aspirin 81 mg.    Confirmed patient has responsible adult to drive home post procedure and be with patient first 24 hours after arriving home.  [facility] does allow one visitor to accompany you and wait in the hospital waiting room while you are there for your procedure. You and your visitor will be asked to wear a mask once you enter the hospital.   Patient reports does not currently have any symptoms concerning for COVID-19 and no household members with COVID-19 like illness.      Reviewed procedure/mask/visitor instructions with patient.

## 2021-04-25 NOTE — Telephone Encounter (Signed)
EKG received from [facility].  Dr Irish Lack has reviewed and patient will not need to wear monitor.  Needs referral to EP for SVT. Patient notified.  He has received monitor in the mail but has not opened.

## 2021-04-26 ENCOUNTER — Other Ambulatory Visit: Payer: Self-pay

## 2021-04-26 ENCOUNTER — Ambulatory Visit (HOSPITAL_COMMUNITY)
Admission: RE | Admit: 2021-04-26 | Discharge: 2021-04-26 | Disposition: A | Discharge status: Home / Self Care | Payer: Medicare Other | Attending: Interventional Cardiology | Admitting: Interventional Cardiology

## 2021-04-26 ENCOUNTER — Ambulatory Visit (HOSPITAL_COMMUNITY)
Admission: RE | Disposition: A | Discharge status: Home / Self Care | Payer: Self-pay | Source: Home / Self Care | Attending: Interventional Cardiology

## 2021-04-26 ENCOUNTER — Encounter (HOSPITAL_COMMUNITY): Payer: Self-pay | Admitting: Interventional Cardiology

## 2021-04-26 DIAGNOSIS — Z79899 Other long term (current) drug therapy: Secondary | ICD-10-CM | POA: Diagnosis not present

## 2021-04-26 DIAGNOSIS — E669 Obesity, unspecified: Secondary | ICD-10-CM | POA: Insufficient documentation

## 2021-04-26 DIAGNOSIS — I25118 Atherosclerotic heart disease of native coronary artery with other forms of angina pectoris: Secondary | ICD-10-CM | POA: Diagnosis not present

## 2021-04-26 DIAGNOSIS — F17210 Nicotine dependence, cigarettes, uncomplicated: Secondary | ICD-10-CM | POA: Insufficient documentation

## 2021-04-26 DIAGNOSIS — E785 Hyperlipidemia, unspecified: Secondary | ICD-10-CM | POA: Diagnosis not present

## 2021-04-26 DIAGNOSIS — Z8249 Family history of ischemic heart disease and other diseases of the circulatory system: Secondary | ICD-10-CM | POA: Insufficient documentation

## 2021-04-26 DIAGNOSIS — Z7982 Long term (current) use of aspirin: Secondary | ICD-10-CM | POA: Insufficient documentation

## 2021-04-26 DIAGNOSIS — E119 Type 2 diabetes mellitus without complications: Secondary | ICD-10-CM | POA: Insufficient documentation

## 2021-04-26 DIAGNOSIS — Z7984 Long term (current) use of oral hypoglycemic drugs: Secondary | ICD-10-CM | POA: Insufficient documentation

## 2021-04-26 DIAGNOSIS — Z6837 Body mass index (BMI) 37.0-37.9, adult: Secondary | ICD-10-CM | POA: Insufficient documentation

## 2021-04-26 DIAGNOSIS — Z888 Allergy status to other drugs, medicaments and biological substances status: Secondary | ICD-10-CM | POA: Insufficient documentation

## 2021-04-26 DIAGNOSIS — I471 Supraventricular tachycardia: Secondary | ICD-10-CM | POA: Insufficient documentation

## 2021-04-26 DIAGNOSIS — I2510 Atherosclerotic heart disease of native coronary artery without angina pectoris: Secondary | ICD-10-CM

## 2021-04-26 HISTORY — PX: LEFT HEART CATH AND CORONARY ANGIOGRAPHY: CATH118249

## 2021-04-26 LAB — GLUCOSE, CAPILLARY: Glucose-Capillary: 152 mg/dL — ABNORMAL HIGH (ref 70–99)

## 2021-04-26 SURGERY — LEFT HEART CATH AND CORONARY ANGIOGRAPHY
Anesthesia: LOCAL

## 2021-04-26 MED ORDER — SODIUM CHLORIDE 0.9% FLUSH: 3.0000 mL | INTRAVENOUS | Status: DC | PRN

## 2021-04-26 MED ORDER — ONDANSETRON HCL 4 MG/2ML IJ SOLN: 4.0000 mg | Freq: Four times a day (QID) | INTRAMUSCULAR | Status: DC | PRN

## 2021-04-26 MED ORDER — VERAPAMIL HCL 2.5 MG/ML IV SOLN
INTRAVENOUS | Status: AC
  Filled 2021-04-26: qty 2

## 2021-04-26 MED ORDER — SODIUM CHLORIDE 0.9 % WEIGHT BASED INFUSION
3.0000 mL/kg/h | INTRAVENOUS | Status: AC
  Administered 2021-04-26: 3 mL/kg/h via INTRAVENOUS

## 2021-04-26 MED ORDER — LABETALOL HCL 5 MG/ML IV SOLN: 10.0000 mg | INTRAVENOUS | Status: DC | PRN

## 2021-04-26 MED ORDER — HEPARIN SODIUM (PORCINE) 1000 UNIT/ML IJ SOLN
INTRAMUSCULAR | Status: DC | PRN
  Administered 2021-04-26: 6000 [IU] via INTRAVENOUS

## 2021-04-26 MED ORDER — SODIUM CHLORIDE 0.9 % IV SOLN: 250.0000 mL | INTRAVENOUS | Status: DC | PRN

## 2021-04-26 MED ORDER — MIDAZOLAM HCL 2 MG/2ML IJ SOLN
INTRAMUSCULAR | Status: AC
  Filled 2021-04-26: qty 2

## 2021-04-26 MED ORDER — MIDAZOLAM HCL 2 MG/2ML IJ SOLN
INTRAMUSCULAR | Status: DC | PRN
  Administered 2021-04-26: 2 mg via INTRAVENOUS

## 2021-04-26 MED ORDER — FENTANYL CITRATE (PF) 100 MCG/2ML IJ SOLN
INTRAMUSCULAR | Status: AC
  Filled 2021-04-26: qty 2

## 2021-04-26 MED ORDER — DIPHENHYDRAMINE HCL 50 MG/ML IJ SOLN
INTRAMUSCULAR | Status: DC | PRN
  Administered 2021-04-26: 25 mg via INTRAVENOUS

## 2021-04-26 MED ORDER — HEPARIN SODIUM (PORCINE) 1000 UNIT/ML IJ SOLN
INTRAMUSCULAR | Status: AC
  Filled 2021-04-26: qty 1

## 2021-04-26 MED ORDER — HYDRALAZINE HCL 20 MG/ML IJ SOLN: 10.0000 mg | INTRAMUSCULAR | Status: DC | PRN

## 2021-04-26 MED ORDER — SODIUM CHLORIDE 0.9% FLUSH: 3.0000 mL | Freq: Two times a day (BID) | INTRAVENOUS | Status: DC

## 2021-04-26 MED ORDER — SODIUM CHLORIDE 0.9 % IV SOLN: INTRAVENOUS | Status: AC

## 2021-04-26 MED ORDER — SODIUM CHLORIDE 0.9 % WEIGHT BASED INFUSION: 1.0000 mL/kg/h | INTRAVENOUS | Status: DC

## 2021-04-26 MED ORDER — DIPHENHYDRAMINE HCL 50 MG/ML IJ SOLN
INTRAMUSCULAR | Status: AC
  Filled 2021-04-26: qty 1

## 2021-04-26 MED ORDER — IOHEXOL 350 MG/ML SOLN
INTRAVENOUS | Status: DC | PRN
  Administered 2021-04-26: 85 mL

## 2021-04-26 MED ORDER — FENTANYL CITRATE (PF) 100 MCG/2ML IJ SOLN
INTRAMUSCULAR | Status: DC | PRN
  Administered 2021-04-26: 25 ug via INTRAVENOUS

## 2021-04-26 MED ORDER — LIDOCAINE HCL (PF) 1 % IJ SOLN
INTRAMUSCULAR | Status: DC | PRN
  Administered 2021-04-26: 2 mL

## 2021-04-26 MED ORDER — HEPARIN (PORCINE) IN NACL 1000-0.9 UT/500ML-% IV SOLN
INTRAVENOUS | Status: AC
  Filled 2021-04-26: qty 500

## 2021-04-26 MED ORDER — ASPIRIN 81 MG PO CHEW: 81.0000 mg | CHEWABLE_TABLET | ORAL | Status: DC

## 2021-04-26 MED ORDER — METFORMIN HCL ER 500 MG PO TB24: 500.0000 mg | ORAL_TABLET | ORAL | Status: AC

## 2021-04-26 MED ORDER — VERAPAMIL HCL 2.5 MG/ML IV SOLN
INTRAVENOUS | Status: DC | PRN
  Administered 2021-04-26: 10 mL via INTRA_ARTERIAL

## 2021-04-26 MED ORDER — ACETAMINOPHEN 325 MG PO TABS: 650.0000 mg | ORAL_TABLET | ORAL | Status: DC | PRN

## 2021-04-26 MED ORDER — LIDOCAINE HCL (PF) 1 % IJ SOLN
INTRAMUSCULAR | Status: AC
  Filled 2021-04-26: qty 30

## 2021-04-26 SURGICAL SUPPLY — 9 items

## 2021-04-26 NOTE — Discharge Instructions (Signed)

## 2021-04-26 NOTE — Progress Notes (Signed)
Patient and wife was given discharge instructions. Both verbalized understanding. 

## 2021-04-26 NOTE — Interval H&P Note (Signed)
Cath Lab Visit (complete for each Cath Lab visit)  Clinical Evaluation Leading to the Procedure:   ACS: No.  Non-ACS:    Anginal Classification: CCS III  Anti-ischemic medical therapy: Minimal Therapy (1 class of medications)  Non-Invasive Test Results: High-risk stress test findings: cardiac mortality >3%/year  Prior CABG: No previous CABG    Occluded circumflex by CTA  History and Physical Interval Note:  04/26/2021 7:34 AM  Tanner Garza  has presented today for surgery, with the diagnosis of CAD.  The various methods of treatment have been discussed with the patient and family. After consideration of risks, benefits and other options for treatment, the patient has consented to  Procedure(s): LEFT HEART CATH AND CORONARY ANGIOGRAPHY (N/A) as a surgical intervention.  The patient's history has been reviewed, patient examined, no change in status, stable for surgery.  I have reviewed the patient's chart and labs.  Questions were answered to the patient's satisfaction.     Larae Grooms

## 2021-04-27 DIAGNOSIS — G4733 Obstructive sleep apnea (adult) (pediatric): Secondary | ICD-10-CM | POA: Diagnosis not present

## 2021-04-27 MED FILL — Heparin Sod (Porcine)-NaCl IV Soln 1000 Unit/500ML-0.9%: INTRAVENOUS | Qty: 1000 | Status: AC

## 2021-04-28 ENCOUNTER — Telehealth: Payer: Self-pay | Admitting: Interventional Cardiology

## 2021-04-28 DIAGNOSIS — E119 Type 2 diabetes mellitus without complications: Secondary | ICD-10-CM

## 2021-04-28 DIAGNOSIS — E782 Mixed hyperlipidemia: Secondary | ICD-10-CM

## 2021-04-28 MED ORDER — ROSUVASTATIN CALCIUM 20 MG PO TABS: 20.0000 mg | ORAL_TABLET | Freq: Every day | ORAL | 3 refills | Status: DC

## 2021-04-28 NOTE — Telephone Encounter (Signed)
Increase rosuvastatin to 20 mg daily.  Repeat liver and lipid tests in 3 months

## 2021-04-28 NOTE — Telephone Encounter (Signed)
Gave patient recommendations. Ordered increased medication. Placed lab orders. The patient will get them 07/27/21 by the [facility] office.  Verbalized agreement and understanding.

## 2021-05-02 ENCOUNTER — Telehealth: Payer: Self-pay | Admitting: Interventional Cardiology

## 2021-05-02 NOTE — Telephone Encounter (Signed)
Patient called stating he had a stent done last week on 04/26/2021.  He wants to know when he can be active with his left hand.

## 2021-05-02 NOTE — Telephone Encounter (Signed)
I spoke with patient. He reports right radial cath site is fine. Slightly raised at cath site but no bruising or pain. Procedure was 10/11.  He is asking if OK to resume golfing.  I told him this should be OK.  He will let us know if he develops any problems at cath site.  Patient aware of upcoming appointment with Dr Curt Bears.  He has follow up with Dr Irish Lack in March

## 2021-05-26 ENCOUNTER — Ambulatory Visit (INDEPENDENT_AMBULATORY_CARE_PROVIDER_SITE_OTHER): Payer: Medicare Other | Admitting: Cardiology

## 2021-05-26 ENCOUNTER — Other Ambulatory Visit: Payer: Self-pay

## 2021-05-26 ENCOUNTER — Encounter: Payer: Self-pay | Admitting: Cardiology

## 2021-05-26 VITALS — BP 136/78 | HR 88 | Ht 72.0 in | Wt 280.0 lb

## 2021-05-26 DIAGNOSIS — I471 Supraventricular tachycardia: Secondary | ICD-10-CM | POA: Diagnosis not present

## 2021-05-26 NOTE — Patient Instructions (Signed)
Medication Instructions:  Your physician recommends that you continue on your current medications as directed. Please refer to the Current Medication list given to you today.  *If you need a refill on your cardiac medications before your next appointment, please call your pharmacy*   Lab Work: None ordered    Testing/Procedures: None ordered   Follow-Up: At [facility], you and your health needs are our priority.  As part of our continuing mission to provide you with exceptional heart care, we have created designated Provider Care Teams.  These Care Teams include your primary Cardiologist (physician) and Advanced Practice Providers (APPs -  Physician Assistants and Nurse Practitioners) who all work together to provide you with the care you need, when you need it.  Your next appointment:   6 month(s) in [place]  The format for your next appointment:   In Person  Provider:   Allegra Lai, MD    Thank you for choosing [facility]!!   Trinidad Curet, RN [phone]   Other Instructions   Cardiac Ablation Cardiac ablation is a procedure to destroy (ablate) some heart tissue that is sending bad signals. These bad signals cause problems in heart rhythm. The heart has many areas that make these signals. If there are problems in these areas, they can make the heart beat in a way that is not normal. Destroying some tissues can help make the heart rhythm normal. Tell your doctor about: Any allergies you have. All medicines you are taking. These include vitamins, herbs, eye drops, creams, and over-the-counter medicines. Any problems you or family members have had with medicines that make you fall asleep (anesthetics). Any blood disorders you have. Any surgeries you have had. Any medical conditions you have, such as kidney failure. Whether you are pregnant or may be pregnant. What are the risks? This is a safe procedure. But problems may occur,  including: Infection. Bruising and bleeding. Bleeding into the chest. Stroke or blood clots. Damage to nearby areas of your body. Allergies to medicines or dyes. The need for a pacemaker if the normal system is damaged. Failure of the procedure to treat the problem. What happens before the procedure? Medicines Ask your doctor about: Changing or stopping your normal medicines. This is important. Taking aspirin and ibuprofen. Do not take these medicines unless your doctor tells you to take them. Taking other medicines, vitamins, herbs, and supplements. General instructions Follow instructions from your doctor about what you cannot eat or drink. Plan to have someone take you home from the hospital or clinic. If you will be going home right after the procedure, plan to have someone with you for 24 hours. Ask your doctor what steps will be taken to prevent infection. What happens during the procedure?  An IV tube will be put into one of your veins. You will be given a medicine to help you relax. The skin on your neck or groin will be numbed. A cut (incision) will be made in your neck or groin. A needle will be put through your cut and into a large vein. A tube (catheter) will be put into the needle. The tube will be moved to your heart. Dye may be put through the tube. This helps your doctor see your heart. Small devices (electrodes) on the tube will send out signals. A type of energy will be used to destroy some heart tissue. The tube will be taken out. Pressure will be held on your cut. This helps stop bleeding. A bandage will  be put over your cut. The exact procedure may vary among doctors and hospitals. What happens after the procedure? You will be watched until you leave the hospital or clinic. This includes checking your heart rate, breathing rate, oxygen, and blood pressure. Your cut will be watched for bleeding. You will need to lie still for a few hours. Do not drive for 24  hours or as long as your doctor tells you. Summary Cardiac ablation is a procedure to destroy some heart tissue. This is done to treat heart rhythm problems. Tell your doctor about any medical conditions you may have. Tell him or her about all medicines you are taking to treat them. This is a safe procedure. But problems may occur. These include infection, bruising, bleeding, and damage to nearby areas of your body. Follow what your doctor tells you about food and drink. You may also be told to change or stop some of your medicines. After the procedure, do not drive for 24 hours or as long as your doctor tells you. This information is not intended to replace advice given to you by your health care provider. Make sure you discuss any questions you have with your health care provider. Document Revised: 06/05/2019 Document Reviewed: 06/05/2019 Elsevier Patient Education  2022 [employer].

## 2021-05-26 NOTE — Progress Notes (Signed)
Electrophysiology Office Note   Date:  05/26/2021   ID:  Tanner Garza, DOB 02/26/1946, MRN [id number]  PCP:  Shirline Frees, MD  Cardiologist:  Irish Lack Primary Electrophysiologist:  Xitlaly Ault Meredith Leeds, MD    Chief Complaint: SVT   History of Present Illness: Tanner Garza is a 75 y.o. male who is being seen today for the evaluation of SVT at the request of Jettie Booze, MD. Presenting today for electrophysiology evaluation.  He has a history significant for hypertension, hyperlipidemia, SVT, obstructive sleep apnea on CPAP, and coronary artery disease.  He has been having episodes of palpitations.  He went to [facility] emergency room and was found to have heart rate in the 190s to 200s.  He spontaneously converted.  He has also received adenosine in the past which converted him to sinus rhythm.  Today, he denies symptoms of palpitations, chest pain, shortness of breath, orthopnea, PND, lower extremity edema, claudication, dizziness, presyncope, syncope, bleeding, or neurologic sequela. The patient is tolerating medications without difficulties.  He was at [facility] in May 2022.  He has had 1 further episode.  After his left heart catheterization, his diltiazem was increased to 240 mg daily and he has had no further episodes.   Past Medical History:  Diagnosis Date   Anxiety    Bell's palsy 09/2001   BPH (benign prostatic hyperplasia)    Closed fracture of left humerus 12/09/2014   Colon polyp    Complication of anesthesia    Slow to awaken   Dysrhythmia    2 "bouts of tachycardia   Eczema    Esophageal reflux    Fracture of left distal radius 0/38/8828   Hardware complicating wound infection ([place])    Humerus fracture 12/09/2014   Hyperlipidemia    Hypertension    Laceration of middle finger of left hand without complication 0/09/4915   OSA (obstructive sleep apnea)    uses cpap   Staphylococcal infection    SVT (supraventricular tachycardia) ([place])     Past Surgical History:  Procedure Laterality Date   APPENDECTOMY     CHOLECYSTECTOMY     COLONOSCOPY W/ POLYPECTOMY     EXTERNAL FIXATION REMOVAL Left 07/23/2015   Procedure: REMOVAL EXTERNAL FIXATION LEFT ARM;  Surgeon: Altamese Sidell, MD;  Location: [place];  Service: Orthopedics;  Laterality: Left;   I & D EXTREMITY Left 12/09/2014   Procedure: IRRIGATION AND DEBRIDEMENT LEFT LONG FINGER;  Surgeon: Leandrew Koyanagi, MD;  Location: [place];  Service: Orthopedics;  Laterality: Left;   LEFT HEART CATH AND CORONARY ANGIOGRAPHY N/A 04/26/2021   Procedure: LEFT HEART CATH AND CORONARY ANGIOGRAPHY;  Surgeon: Jettie Booze, MD;  Location: [place] [facility];  Service: Cardiovascular;  Laterality: N/A;   OPEN REDUCTION INTERNAL FIXATION (ORIF) DISTAL RADIAL FRACTURE Left 12/09/2014   Procedure: OPEN REDUCTION INTERNAL FIXATION (ORIF) DISTAL RADIAL FRACTURE;  Surgeon: Leandrew Koyanagi, MD;  Location: [place];  Service: Orthopedics;  Laterality: Left;   ORIF HUMERUS FRACTURE Left 12/09/2014   Procedure: OPEN REDUCTION INTERNAL FIXATION (ORIF) HUMERAL SHAFT FRACTURE;  Surgeon: Leandrew Koyanagi, MD;  Location: [place];  Service: Orthopedics;  Laterality: Left;   ORIF HUMERUS FRACTURE Left 04/20/2015   Procedure: OPEN REDUCTION INTERNAL FIXATION (ORIF) LEFT HUMERAL SHAFT NONUNION ATTEMPTED, REMOVAL OF PLATE and SCREWS APPLICATION OF EXTERNAL FIXATOR LEFT HUMERUS PLACEMENT OF ANTIBIOTIC BEADS;  Surgeon: Leandrew Koyanagi, MD;  Location: [place];  Service: Orthopedics;  Laterality: Left;   REPLANTATION THUMB  Right      Current Outpatient Medications  Medication Sig Dispense Refill   ALPRAZolam (XANAX) 0.5 MG tablet Take 0.5 mg by mouth every morning.     aspirin 81 MG tablet Take 81 mg by mouth every morning.     diclofenac Sodium (VOLTAREN) 1 % GEL Apply 2 g topically daily as needed (pain).     diltiazem (CARDIZEM CD) 240 MG 24 hr capsule Take 1 capsule (240 mg total) by mouth daily. 90 capsule 3   dutasteride (AVODART)  0.5 MG capsule Take 0.5 mg by mouth daily with supper.      gabapentin (NEURONTIN) 300 MG capsule Take 300 mg by mouth daily with supper.      losartan (COZAAR) 50 MG tablet Take 1 tablet (50 mg total) by mouth daily. 90 tablet 3   metFORMIN (GLUCOPHAGE-XR) 500 MG 24 hr tablet Take 1-2 tablets (500-1,000 mg total) by mouth See admin instructions. Take 1000 mg in the morning and 500 mg at supper     Multiple Vitamin (MULTIVITAMIN) tablet Take 1 tablet by mouth every morning.      Multiple Vitamins-Minerals (PRESERVISION AREDS) CAPS Take 1 capsule by mouth every evening.     pantoprazole (PROTONIX) 40 MG tablet Take 40 mg by mouth daily with supper.      rosuvastatin (CRESTOR) 20 MG tablet Take 1 tablet (20 mg total) by mouth daily. 90 tablet 3   spironolactone (ALDACTONE) 25 MG tablet Take 25 mg by mouth every morning.      No current facility-administered medications for this visit.    Allergies:   Atenolol, Chantix [varenicline], Hctz [hydrochlorothiazide], Other, and Telepaque [iopanoic acid]   Social History:  The patient  reports that he has been smoking cigarettes. He has a 22.50 pack-year smoking history. He has never used smokeless tobacco. He reports that he does not drink alcohol and does not use drugs.   Family History:  The patient's family history includes Hypertension in his father.    ROS:  Please see the history of present illness.   Otherwise, review of systems is positive for none.   All other systems are reviewed and negative.    PHYSICAL EXAM: VS:  BP 136/78   Pulse 88   Ht 6' (1.829 m)   Wt 280 lb (127 kg)   SpO2 95%   BMI 37.97 kg/m  , BMI Body mass index is 37.97 kg/m. GEN: Well nourished, well developed, in no acute distress  HEENT: normal  Neck: no JVD, carotid bruits, or masses Cardiac: RRR; no murmurs, rubs, or gallops,no edema  Respiratory:  clear to auscultation bilaterally, normal work of breathing GI: soft, nontender, nondistended, + BS MS: no  deformity or atrophy  Skin: warm and dry Neuro:  Strength and sensation are intact Psych: euthymic mood, full affect  EKG:  EKG is not ordered today. Personal review of the ekg ordered 12/02/20 shows SVT rate 199  Recent Labs: 04/22/2021: BUN 18; Creatinine, Ser 1.07; Hemoglobin 15.2; Platelets 181; Potassium 4.2; Sodium 138    Lipid Panel  No results found for: CHOL, TRIG, HDL, CHOLHDL, VLDL, LDLCALC, LDLDIRECT   Wt Readings from Last 3 Encounters:  05/26/21 280 lb (127 kg)  04/26/21 280 lb (127 kg)  03/30/21 279 lb (126.6 kg)      Other studies Reviewed: Additional studies/ records that were reviewed today include: LHC 04/26/21  Review of the above records today demonstrates:    Mid LAD lesion is 50% stenosed.   Prox  LAD lesion is 50% stenosed.   LPAV lesion is 100% stenosed.  Left to left collaterals feed the distal circumflex.   Prox RCA lesion is 75% stenosed.   Dist LAD lesion is 70% stenosed.   The left ventricular systolic function is normal.   LV end diastolic pressure is normal.   The left ventricular ejection fraction is 55-65% by visual estimate.   There is no aortic valve stenosis.   Tortuous right subclavian artery.  Left dominant system so it is manageable to engage the left main.   ASSESSMENT AND PLAN:  1.  SVT: Has converted with adenosine I discussed with him further management strategies including medication options versus ablation.  At this point, he would like to hold off on ablation.  He has had no further episodes on his current dose of diltiazem.  He is overall happy with his control.  I Laksh Hinners see him back in 6 months for further discussions.  2.  Hyperlipidemia: Statin per primary cardiology  3.  Obstructive sleep apnea: CPAP compliance.  Case discussed with primary cardiology  Current medicines are reviewed at length with the patient today.   The patient does not have concerns regarding his medicines.  The following changes were made today:   none  Labs/ tests ordered today include:  No orders of the defined types were placed in this encounter.    Disposition:   FU with Treylen Gibbs 6 months  Signed, Jamilette Suchocki Meredith Leeds, MD  05/26/2021 9:45 AM     [facility] [address] [place] [address] [phone] (office) [phone] (fax)

## 2021-05-31 ENCOUNTER — Telehealth: Payer: Self-pay | Admitting: Cardiology

## 2021-05-31 DIAGNOSIS — Z01812 Encounter for preprocedural laboratory examination: Secondary | ICD-10-CM

## 2021-05-31 DIAGNOSIS — I471 Supraventricular tachycardia: Secondary | ICD-10-CM

## 2021-05-31 NOTE — Telephone Encounter (Signed)
Patient has decided to go ahead with the ablation. He would like to schedule now.

## 2021-06-02 NOTE — Telephone Encounter (Signed)
Pt is returning a callback to Sherri RN to get his ablation scheduled.  Pt recently saw Dr. Curt Bears in clinic on 11/10, where this was discussed. Informed the pt that Venida Jarvis is out of the office today but I will route this message to her to follow-up with him tomorrow, to assist in arranging his ablation. Pt verbalized understanding and agrees with this plan.  Pt will await Sherri's call tomorrow.

## 2021-06-02 NOTE — Telephone Encounter (Signed)
   Pt's wife calling back to follow up

## 2021-06-03 NOTE — Telephone Encounter (Signed)
Returned call. Will hold date for SVT ablation 12/14 and be in touch. Pt is agreeable

## 2021-06-16 NOTE — Telephone Encounter (Signed)
   Pt's wife calling back to follow up to schedule pt's procedure

## 2021-06-16 NOTE — Telephone Encounter (Signed)
Called pt informed that MD and RN are not in the office today.  I will route message to them to address.  Pt expressed that he is scheduled for 06/29/21 and wants to know what all he needs to do before procedure date.   I again advised pt that RN will contact him with more information.

## 2021-06-20 ENCOUNTER — Encounter: Payer: Self-pay | Admitting: *Deleted

## 2021-06-20 NOTE — Telephone Encounter (Signed)
Patient said he was "penciled in" for an ablation 06/29/21 but has not had any instructions as to what to do before the procedure

## 2021-06-20 NOTE — Telephone Encounter (Signed)
Returned call to patient.  Apologized for the delay. Aware procedure is still scheduled for 12/14. Pt will stop by the [place] office this week (most likely tomorrow) for pre procedure blood work. He will pick up procedure letter of instructions when there.

## 2021-06-20 NOTE — Telephone Encounter (Signed)
Letter put at the front desk.

## 2021-06-21 DIAGNOSIS — I471 Supraventricular tachycardia: Secondary | ICD-10-CM | POA: Diagnosis not present

## 2021-06-21 DIAGNOSIS — Z01812 Encounter for preprocedural laboratory examination: Secondary | ICD-10-CM | POA: Diagnosis not present

## 2021-06-22 LAB — CBC
Hematocrit: 43.5 % (ref 37.5–51.0)
Hemoglobin: 15.1 g/dL (ref 13.0–17.7)
MCH: 32.7 pg (ref 26.6–33.0)
MCHC: 34.7 g/dL (ref 31.5–35.7)
MCV: 94 fL (ref 79–97)
Platelets: 190 10*3/uL (ref 150–450)
RBC: 4.62 x10E6/uL (ref 4.14–5.80)
RDW: 12.8 % (ref 11.6–15.4)
WBC: 7 10*3/uL (ref 3.4–10.8)

## 2021-06-22 LAB — BASIC METABOLIC PANEL
BUN/Creatinine Ratio: 18 (ref 10–24)
BUN: 17 mg/dL (ref 8–27)
CO2: 25 mmol/L (ref 20–29)
Calcium: 9.7 mg/dL (ref 8.6–10.2)
Chloride: 95 mmol/L — ABNORMAL LOW (ref 96–106)
Creatinine, Ser: 0.97 mg/dL (ref 0.76–1.27)
Glucose: 155 mg/dL — ABNORMAL HIGH (ref 70–99)
Potassium: 4.4 mmol/L (ref 3.5–5.2)
Sodium: 137 mmol/L (ref 134–144)
eGFR: 82 mL/min/{1.73_m2} (ref >59)

## 2021-06-28 NOTE — Pre-Procedure Instructions (Signed)
Attempted to call patient regarding procedure instructions.  Left voicemail on the following items: Arrival time 1230 Nothing to eat or drink after midnight No meds AM of procedure Responsible person to drive you home and stay with you for 24 hrs

## 2021-06-29 ENCOUNTER — Ambulatory Visit (HOSPITAL_COMMUNITY)
Admission: RE | Admit: 2021-06-29 | Discharge: 2021-06-29 | Disposition: A | Discharge status: Home / Self Care | Payer: Medicare Other | Attending: Cardiology | Admitting: Cardiology

## 2021-06-29 ENCOUNTER — Ambulatory Visit (HOSPITAL_COMMUNITY): Payer: Medicare Other | Admitting: Anesthesiology

## 2021-06-29 ENCOUNTER — Other Ambulatory Visit: Payer: Self-pay

## 2021-06-29 ENCOUNTER — Encounter (HOSPITAL_COMMUNITY)
Admission: RE | Disposition: A | Discharge status: Home / Self Care | Payer: Self-pay | Source: Home / Self Care | Attending: Cardiology

## 2021-06-29 DIAGNOSIS — I471 Supraventricular tachycardia: Secondary | ICD-10-CM | POA: Diagnosis not present

## 2021-06-29 DIAGNOSIS — E119 Type 2 diabetes mellitus without complications: Secondary | ICD-10-CM | POA: Diagnosis not present

## 2021-06-29 DIAGNOSIS — E785 Hyperlipidemia, unspecified: Secondary | ICD-10-CM | POA: Diagnosis not present

## 2021-06-29 DIAGNOSIS — G4733 Obstructive sleep apnea (adult) (pediatric): Secondary | ICD-10-CM | POA: Diagnosis not present

## 2021-06-29 DIAGNOSIS — I2510 Atherosclerotic heart disease of native coronary artery without angina pectoris: Secondary | ICD-10-CM | POA: Diagnosis not present

## 2021-06-29 DIAGNOSIS — Z7984 Long term (current) use of oral hypoglycemic drugs: Secondary | ICD-10-CM | POA: Diagnosis not present

## 2021-06-29 DIAGNOSIS — I10 Essential (primary) hypertension: Secondary | ICD-10-CM | POA: Diagnosis not present

## 2021-06-29 HISTORY — PX: SVT ABLATION: EP1225

## 2021-06-29 LAB — GLUCOSE, CAPILLARY: Glucose-Capillary: 156 mg/dL — ABNORMAL HIGH (ref 70–99)

## 2021-06-29 SURGERY — SVT ABLATION
Anesthesia: Monitor Anesthesia Care

## 2021-06-29 MED ORDER — PHENYLEPHRINE 40 MCG/ML (10ML) SYRINGE FOR IV PUSH (FOR BLOOD PRESSURE SUPPORT)
PREFILLED_SYRINGE | INTRAVENOUS | Status: DC | PRN
  Administered 2021-06-29: 120 ug via INTRAVENOUS
  Administered 2021-06-29 (×2): 80 ug via INTRAVENOUS

## 2021-06-29 MED ORDER — ACETAMINOPHEN 500 MG PO TABS
1000.0000 mg | ORAL_TABLET | Freq: Once | ORAL | Status: AC
  Administered 2021-06-29: 13:00:00 1000 mg via ORAL
  Filled 2021-06-29 (×2): qty 2

## 2021-06-29 MED ORDER — PROPOFOL 10 MG/ML IV BOLUS
INTRAVENOUS | Status: DC | PRN
  Administered 2021-06-29 (×2): 20 mg via INTRAVENOUS
  Administered 2021-06-29: 50 mg via INTRAVENOUS

## 2021-06-29 MED ORDER — BUPIVACAINE HCL (PF) 0.25 % IJ SOLN
INTRAMUSCULAR | Status: DC | PRN
  Administered 2021-06-29: 60 mL

## 2021-06-29 MED ORDER — PROPOFOL 500 MG/50ML IV EMUL
INTRAVENOUS | Status: DC | PRN
Start: 2021-06-29 — End: 2021-06-29
  Administered 2021-06-29: 50 ug/kg/min via INTRAVENOUS

## 2021-06-29 MED ORDER — SODIUM CHLORIDE 0.9 % IV SOLN: INTRAVENOUS | Status: DC

## 2021-06-29 MED ORDER — BUPIVACAINE HCL (PF) 0.25 % IJ SOLN
INTRAMUSCULAR | Status: AC
  Filled 2021-06-29: qty 30

## 2021-06-29 MED ORDER — ACETAMINOPHEN 325 MG PO TABS
650.0000 mg | ORAL_TABLET | ORAL | Status: DC | PRN
  Filled 2021-06-29: qty 2

## 2021-06-29 MED ORDER — ONDANSETRON HCL 4 MG/2ML IJ SOLN: 4.0000 mg | Freq: Four times a day (QID) | INTRAMUSCULAR | Status: DC | PRN

## 2021-06-29 MED ORDER — ISOPROTERENOL HCL 0.2 MG/ML IJ SOLN
INTRAVENOUS | Status: DC | PRN
  Administered 2021-06-29: 16:00:00 2 ug/min via INTRAVENOUS

## 2021-06-29 MED ORDER — PHENYLEPHRINE HCL-NACL 20-0.9 MG/250ML-% IV SOLN
INTRAVENOUS | Status: DC | PRN
  Administered 2021-06-29: 25 ug/min via INTRAVENOUS

## 2021-06-29 MED ORDER — ISOPROTERENOL HCL 0.2 MG/ML IJ SOLN
INTRAMUSCULAR | Status: AC
  Filled 2021-06-29: qty 5

## 2021-06-29 MED ORDER — ONDANSETRON HCL 4 MG/2ML IJ SOLN
INTRAMUSCULAR | Status: DC | PRN
  Administered 2021-06-29: 4 mg via INTRAVENOUS

## 2021-06-29 MED ORDER — FENTANYL CITRATE (PF) 100 MCG/2ML IJ SOLN
INTRAMUSCULAR | Status: DC | PRN
  Administered 2021-06-29 (×2): 25 ug via INTRAVENOUS

## 2021-06-29 SURGICAL SUPPLY — 14 items
BAG SNAP BAND KOVER 36X36 (MISCELLANEOUS) ×2 IMPLANT
CATH EZ STEER NAV 4MM D-F CUR (ABLATOR) ×2 IMPLANT
CATH JOSEPHSON QUAD-ALLRED 6FR (CATHETERS) ×4 IMPLANT
CATH WEBSTER BI DIR CS D-F CRV (CATHETERS) ×2 IMPLANT
CLOSURE PERCLOSE PROSTYLE (VASCULAR PRODUCTS) ×8 IMPLANT
MAT PREVALON FULL STRYKER (MISCELLANEOUS) ×2 IMPLANT
PACK EP LATEX FREE (CUSTOM PROCEDURE TRAY) ×3
PACK EP LF (CUSTOM PROCEDURE TRAY) ×1 IMPLANT
PAD DEFIB RADIO PHYSIO CONN (PAD) ×3 IMPLANT
PATCH CARTO3 (PAD) ×2 IMPLANT
SHEATH PINNACLE 6F 10CM (SHEATH) ×4 IMPLANT
SHEATH PINNACLE 7F 10CM (SHEATH) ×2 IMPLANT
SHEATH PINNACLE 8F 10CM (SHEATH) ×2 IMPLANT
SHEATH PROBE COVER 6X72 (BAG) ×2 IMPLANT

## 2021-06-29 NOTE — Transfer of Care (Signed)
Immediate Anesthesia Transfer of Care Note  Patient: Tanner Garza  Procedure(s) Performed: SVT ABLATION  Patient Location: Cath Lab  Anesthesia Type:MAC  Level of Consciousness: awake, alert  and oriented  Airway & Oxygen Therapy: Patient Spontanous Breathing and Patient connected to nasal cannula oxygen  Post-op Assessment: Report given to RN and Post -op Vital signs reviewed and stable  Post vital signs: Reviewed and stable  Last Vitals:  Vitals Value Taken Time  BP 152/90 06/29/21 1719  Temp    Pulse 75 06/29/21 1720  Resp 13 06/29/21 1720  SpO2 96 % 06/29/21 1720  Vitals shown include unvalidated device data.  Last Pain:  Vitals:   06/29/21 1219  TempSrc: Oral         Complications: There were no known notable events for this encounter.

## 2021-06-29 NOTE — Anesthesia Procedure Notes (Signed)
Procedure Name: MAC Date/Time: 06/29/2021 3:25 PM Performed by: Dorthea Cove, CRNA Pre-anesthesia Checklist: Emergency Drugs available, Patient identified, Suction available, Patient being monitored and Timeout performed Patient Re-evaluated:Patient Re-evaluated prior to induction Oxygen Delivery Method: Simple face mask Preoxygenation: Pre-oxygenation with 100% oxygen Induction Type: IV induction Placement Confirmation: positive ETCO2 and CO2 detector Dental Injury: Teeth and Oropharynx as per pre-operative assessment

## 2021-06-29 NOTE — Anesthesia Preprocedure Evaluation (Addendum)
Anesthesia Evaluation  Patient identified by MRN, date of birth, ID band Patient awake    Reviewed: Allergy & Precautions, NPO status , Patient's Chart, lab work & pertinent test results  History of Anesthesia Complications (+) PROLONGED EMERGENCE and history of anesthetic complications  Airway Mallampati: III  TM Distance: >3 FB Neck ROM: Full    Dental  (+) Dental Advisory Given, Poor Dentition, Missing   Pulmonary sleep apnea and Continuous Positive Airway Pressure Ventilation , Current SmokerPatient did not abstain from smoking.,    Pulmonary exam normal breath sounds clear to auscultation       Cardiovascular hypertension, Pt. on medications (-) angina+ CAD  (-) Past MI Normal cardiovascular exam+ dysrhythmias Supra Ventricular Tachycardia  Rhythm:Regular Rate:Normal     Neuro/Psych PSYCHIATRIC DISORDERS Anxiety  Neuromuscular disease    GI/Hepatic Neg liver ROS, GERD  Medicated and Controlled,  Endo/Other  diabetes, Type 2, Oral Hypoglycemic AgentsObesity   Renal/GU negative Renal ROS     Musculoskeletal negative musculoskeletal ROS (+)   Abdominal   Peds  Hematology negative hematology ROS (+)   Anesthesia Other Findings Day of surgery medications reviewed with the patient.  Reproductive/Obstetrics                            Anesthesia Physical Anesthesia Plan  ASA: 3  Anesthesia Plan: MAC   Post-op Pain Management: Tylenol PO (pre-op)   Induction: Intravenous  PONV Risk Score and Plan: 0 and Dexamethasone, Ondansetron and TIVA  Airway Management Planned: Natural Airway and Nasal Cannula  Additional Equipment:   Intra-op Plan:   Post-operative Plan:   Informed Consent: I have reviewed the patients History and Physical, chart, labs and discussed the procedure including the risks, benefits and alternatives for the proposed anesthesia with the patient or authorized  representative who has indicated his/her understanding and acceptance.     Dental advisory given  Plan Discussed with: CRNA  Anesthesia Plan Comments:        Anesthesia Quick Evaluation

## 2021-06-29 NOTE — Anesthesia Postprocedure Evaluation (Signed)
Anesthesia Post Note  Patient: Tanner Garza  Procedure(s) Performed: SVT ABLATION     Patient location during evaluation: Cath Lab Anesthesia Type: MAC Level of consciousness: awake and alert, patient cooperative and oriented Pain management: pain level controlled Vital Signs Assessment: post-procedure vital signs reviewed and stable Respiratory status: spontaneous breathing, nonlabored ventilation, respiratory function stable and patient connected to nasal cannula oxygen Cardiovascular status: blood pressure returned to baseline and stable Postop Assessment: no apparent nausea or vomiting Anesthetic complications: no   There were no known notable events for this encounter.  Last Vitals:  Vitals:   06/29/21 1720 06/29/21 1725  BP: (!) 152/90 (!) 142/107  Pulse: 75 74  Resp: 13 14  Temp: (!) 36.2 C   SpO2: 96% (!) 83%    Last Pain:  Vitals:   06/29/21 1720  TempSrc: Temporal  PainSc: 0-No pain                 Naiya Corral,E. Alajia Schmelzer

## 2021-06-29 NOTE — H&P (Signed)
Electrophysiology Office Note   Date:  06/29/2021   ID:  Tanner Garza, DOB 20-Dec-1945, MRN [id number]  PCP:  Shirline Frees, MD  Cardiologist:  Irish Lack Primary Electrophysiologist:  Newell Wafer Meredith Leeds, MD    Chief Complaint: SVT   History of Present Illness: Tanner Garza is a 75 y.o. male who is being seen today for the evaluation of SVT at the request of No ref. provider found. Presenting today for electrophysiology evaluation.  He has a history significant for hypertension, hyperlipidemia, SVT, obstructive sleep apnea on CPAP, and coronary artery disease.  He has been having episodes of palpitations.  He went to [facility] emergency room and was found to have heart rate in the 190s to 200s.  He spontaneously converted.  He has also received adenosine in the past which converted him to sinus rhythm.  Today, denies symptoms of palpitations, chest pain, shortness of breath, orthopnea, PND, lower extremity edema, claudication, dizziness, presyncope, syncope, bleeding, or neurologic sequela. The patient is tolerating medications without difficulties. Plan ablation today.    Past Medical History:  Diagnosis Date   Anxiety    Bell's palsy 09/2001   BPH (benign prostatic hyperplasia)    Closed fracture of left humerus 12/09/2014   Colon polyp    Complication of anesthesia    Slow to awaken   Dysrhythmia    2 "bouts of tachycardia   Eczema    Esophageal reflux    Fracture of left distal radius 0/94/7096   Hardware complicating wound infection ([place])    Humerus fracture 12/09/2014   Hyperlipidemia    Hypertension    Laceration of middle finger of left hand without complication 2/83/6629   OSA (obstructive sleep apnea)    uses cpap   Staphylococcal infection    SVT (supraventricular tachycardia) ([place])    Past Surgical History:  Procedure Laterality Date   APPENDECTOMY     CHOLECYSTECTOMY     COLONOSCOPY W/ POLYPECTOMY     EXTERNAL FIXATION REMOVAL Left 07/23/2015    Procedure: REMOVAL EXTERNAL FIXATION LEFT ARM;  Surgeon: Altamese Leland, MD;  Location: [place];  Service: Orthopedics;  Laterality: Left;   I & D EXTREMITY Left 12/09/2014   Procedure: IRRIGATION AND DEBRIDEMENT LEFT LONG FINGER;  Surgeon: Leandrew Koyanagi, MD;  Location: [place];  Service: Orthopedics;  Laterality: Left;   LEFT HEART CATH AND CORONARY ANGIOGRAPHY N/A 04/26/2021   Procedure: LEFT HEART CATH AND CORONARY ANGIOGRAPHY;  Surgeon: Jettie Booze, MD;  Location: [place] [facility];  Service: Cardiovascular;  Laterality: N/A;   OPEN REDUCTION INTERNAL FIXATION (ORIF) DISTAL RADIAL FRACTURE Left 12/09/2014   Procedure: OPEN REDUCTION INTERNAL FIXATION (ORIF) DISTAL RADIAL FRACTURE;  Surgeon: Leandrew Koyanagi, MD;  Location: [place];  Service: Orthopedics;  Laterality: Left;   ORIF HUMERUS FRACTURE Left 12/09/2014   Procedure: OPEN REDUCTION INTERNAL FIXATION (ORIF) HUMERAL SHAFT FRACTURE;  Surgeon: Leandrew Koyanagi, MD;  Location: [place];  Service: Orthopedics;  Laterality: Left;   ORIF HUMERUS FRACTURE Left 04/20/2015   Procedure: OPEN REDUCTION INTERNAL FIXATION (ORIF) LEFT HUMERAL SHAFT NONUNION ATTEMPTED, REMOVAL OF PLATE and SCREWS APPLICATION OF EXTERNAL FIXATOR LEFT HUMERUS PLACEMENT OF ANTIBIOTIC BEADS;  Surgeon: Leandrew Koyanagi, MD;  Location: [place];  Service: Orthopedics;  Laterality: Left;   REPLANTATION THUMB Right      Current Facility-Administered Medications  Medication Dose Route Frequency Provider Last Rate Last Admin   0.9 %  sodium chloride infusion   Intravenous Continuous Ivey Cina Hassell Done,  MD 50 mL/hr at 06/29/21 1248 New Bag at 06/29/21 1248    Allergies:   Atenolol, Chantix [varenicline], Hctz [hydrochlorothiazide], Other, and Telepaque [iopanoic acid]   Social History:  The patient  reports that he has been smoking cigarettes. He has a 22.50 pack-year smoking history. He has never used smokeless tobacco. He reports that he does not drink alcohol and does not use drugs.    Family History:  The patient's family history includes Hypertension in his father.   ROS:  Please see the history of present illness.   Otherwise, review of systems is positive for none.   All other systems are reviewed and negative.   PHYSICAL EXAM: VS:  BP (!) 148/98    Pulse 89    Temp 98.5 F (36.9 C) (Oral)    Ht 5' 11.5" (1.816 m)    Wt 124.7 kg    SpO2 97%    BMI 37.82 kg/m  , BMI Body mass index is 37.82 kg/m. GEN: Well nourished, well developed, in no acute distress  HEENT: normal  Neck: no JVD, carotid bruits, or masses Cardiac: RRR; no murmurs, rubs, or gallops,no edema  Respiratory:  clear to auscultation bilaterally, normal work of breathing GI: soft, nontender, nondistended, + BS MS: no deformity or atrophy  Skin: warm and dry Neuro:  Strength and sensation are intact Psych: euthymic mood, full affect  Recent Labs: 06/21/2021: BUN 17; Creatinine, Ser 0.97; Hemoglobin 15.1; Platelets 190; Potassium 4.4; Sodium 137    Lipid Panel  No results found for: CHOL, TRIG, HDL, CHOLHDL, VLDL, LDLCALC, LDLDIRECT   Wt Readings from Last 3 Encounters:  06/29/21 124.7 kg  05/26/21 127 kg  04/26/21 127 kg      Other studies Reviewed: Additional studies/ records that were reviewed today include: LHC 04/26/21  Review of the above records today demonstrates:    Mid LAD lesion is 50% stenosed.   Prox LAD lesion is 50% stenosed.   LPAV lesion is 100% stenosed.  Left to left collaterals feed the distal circumflex.   Prox RCA lesion is 75% stenosed.   Dist LAD lesion is 70% stenosed.   The left ventricular systolic function is normal.   LV end diastolic pressure is normal.   The left ventricular ejection fraction is 55-65% by visual estimate.   There is no aortic valve stenosis.   Tortuous right subclavian artery.  Left dominant system so it is manageable to engage the left main.   ASSESSMENT AND PLAN:  1.  SVT: Tanner Garza has presented today for surgery, with the  diagnosis of SVT.  The various methods of treatment have been discussed with the patient and family. After consideration of risks, benefits and other options for treatment, the patient has consented to  Procedure(s): Catheter ablation as a surgical intervention .  Risks include but not limited to complete heart block, stroke, esophageal damage, nerve damage, bleeding, vascular damage, tamponade, perforation, MI, and death. The patient's history has been reviewed, patient examined, no change in status, stable for surgery.  I have reviewed the patient's chart and labs.  Questions were answered to the patient's satisfaction.    Tasman Zapata Curt Bears, MD 06/29/2021 2:38 PM

## 2021-06-29 NOTE — Discharge Instructions (Signed)

## 2021-06-30 ENCOUNTER — Encounter (HOSPITAL_COMMUNITY): Payer: Self-pay | Admitting: Cardiology

## 2021-07-19 DIAGNOSIS — Z23 Encounter for immunization: Secondary | ICD-10-CM | POA: Diagnosis not present

## 2021-08-05 DIAGNOSIS — Z7984 Long term (current) use of oral hypoglycemic drugs: Secondary | ICD-10-CM | POA: Diagnosis not present

## 2021-08-05 DIAGNOSIS — H40053 Ocular hypertension, bilateral: Secondary | ICD-10-CM | POA: Diagnosis not present

## 2021-08-05 DIAGNOSIS — H2513 Age-related nuclear cataract, bilateral: Secondary | ICD-10-CM | POA: Diagnosis not present

## 2021-08-05 DIAGNOSIS — H40033 Anatomical narrow angle, bilateral: Secondary | ICD-10-CM | POA: Diagnosis not present

## 2021-08-05 DIAGNOSIS — E119 Type 2 diabetes mellitus without complications: Secondary | ICD-10-CM | POA: Diagnosis not present

## 2021-08-08 ENCOUNTER — Encounter: Payer: Self-pay | Admitting: Cardiology

## 2021-08-08 ENCOUNTER — Ambulatory Visit (INDEPENDENT_AMBULATORY_CARE_PROVIDER_SITE_OTHER): Payer: Medicare Other | Admitting: Cardiology

## 2021-08-08 ENCOUNTER — Other Ambulatory Visit: Payer: Self-pay

## 2021-08-08 VITALS — BP 114/80 | HR 86 | Ht 71.5 in | Wt 270.0 lb

## 2021-08-08 DIAGNOSIS — I25118 Atherosclerotic heart disease of native coronary artery with other forms of angina pectoris: Secondary | ICD-10-CM

## 2021-08-08 DIAGNOSIS — I471 Supraventricular tachycardia: Secondary | ICD-10-CM

## 2021-08-08 NOTE — Progress Notes (Signed)
Electrophysiology Office Note   Date:  08/08/2021   ID:  Tanner Garza, DOB 1946/01/10, MRN [id number]  PCP:  Shirline Frees, MD  Cardiologist:  Irish Lack Primary Electrophysiologist:  Fontaine Hehl Meredith Leeds, MD    Chief Complaint: SVT   History of Present Illness: Tanner Garza is a 76 y.o. male who is being seen today for the evaluation of SVT at the request of Shirline Frees, MD. Presenting today for electrophysiology evaluation.  He has a history significant for hypertension, hyperlipidemia, SVT, obstructive sleep apnea on CPAP, coronary artery disease.  He was having episodes of palpitation.  He went to [facility] emergency room was found to have heart rates of 1 90-200.  He spontaneously converted.  He has also had adenosine in the past which converted him to sinus rhythm.  He is status post ablation for AVNRT on 06/29/2021.  Today, denies symptoms of palpitations, chest pain, shortness of breath, orthopnea, PND, lower extremity edema, claudication, dizziness, presyncope, syncope, bleeding, or neurologic sequela. The patient is tolerating medications without difficulties.  Since his ablation he has done well.  He has had no chest pain or shortness of breath.  He is able do all of his daily activities.  He is unaware of further episodes of SVT.  He is overall happy with his control.   Past Medical History:  Diagnosis Date   Anxiety    Bell's palsy 09/2001   BPH (benign prostatic hyperplasia)    Closed fracture of left humerus 12/09/2014   Colon polyp    Complication of anesthesia    Slow to awaken   Dysrhythmia    2 "bouts of tachycardia   Eczema    Esophageal reflux    Fracture of left distal radius 7/78/2423   Hardware complicating wound infection ([place])    Humerus fracture 12/09/2014   Hyperlipidemia    Hypertension    Laceration of middle finger of left hand without complication 5/36/1443   OSA (obstructive sleep apnea)    uses cpap   Staphylococcal infection     SVT (supraventricular tachycardia) ([place])    Past Surgical History:  Procedure Laterality Date   APPENDECTOMY     CHOLECYSTECTOMY     COLONOSCOPY W/ POLYPECTOMY     EXTERNAL FIXATION REMOVAL Left 07/23/2015   Procedure: REMOVAL EXTERNAL FIXATION LEFT ARM;  Surgeon: Altamese [place], MD;  Location: [place];  Service: Orthopedics;  Laterality: Left;   I & D EXTREMITY Left 12/09/2014   Procedure: IRRIGATION AND DEBRIDEMENT LEFT LONG FINGER;  Surgeon: Leandrew Koyanagi, MD;  Location: [place];  Service: Orthopedics;  Laterality: Left;   LEFT HEART CATH AND CORONARY ANGIOGRAPHY N/A 04/26/2021   Procedure: LEFT HEART CATH AND CORONARY ANGIOGRAPHY;  Surgeon: Jettie Booze, MD;  Location: [place] [facility];  Service: Cardiovascular;  Laterality: N/A;   OPEN REDUCTION INTERNAL FIXATION (ORIF) DISTAL RADIAL FRACTURE Left 12/09/2014   Procedure: OPEN REDUCTION INTERNAL FIXATION (ORIF) DISTAL RADIAL FRACTURE;  Surgeon: Leandrew Koyanagi, MD;  Location: [place];  Service: Orthopedics;  Laterality: Left;   ORIF HUMERUS FRACTURE Left 12/09/2014   Procedure: OPEN REDUCTION INTERNAL FIXATION (ORIF) HUMERAL SHAFT FRACTURE;  Surgeon: Leandrew Koyanagi, MD;  Location: [place];  Service: Orthopedics;  Laterality: Left;   ORIF HUMERUS FRACTURE Left 04/20/2015   Procedure: OPEN REDUCTION INTERNAL FIXATION (ORIF) LEFT HUMERAL SHAFT NONUNION ATTEMPTED, REMOVAL OF PLATE and SCREWS APPLICATION OF EXTERNAL FIXATOR LEFT HUMERUS PLACEMENT OF ANTIBIOTIC BEADS;  Surgeon: Leandrew Koyanagi, MD;  Location:  [place] [facility];  Service: Orthopedics;  Laterality: Left;   REPLANTATION THUMB Right    SVT ABLATION N/A 06/29/2021   Procedure: SVT ABLATION;  Surgeon: Constance Haw, MD;  Location: [place] [facility];  Service: Cardiovascular;  Laterality: N/A;     Current Outpatient Medications  Medication Sig Dispense Refill   ALPRAZolam (XANAX) 0.5 MG tablet Take 0.5 mg by mouth every morning.     aspirin 81 MG tablet Take 81 mg by mouth every morning.      diclofenac Sodium (VOLTAREN) 1 % GEL Apply 2 g topically daily as needed (pain).     diltiazem (CARDIZEM CD) 240 MG 24 hr capsule Take 1 capsule (240 mg total) by mouth daily. 90 capsule 3   dutasteride (AVODART) 0.5 MG capsule Take 0.5 mg by mouth daily with supper.      gabapentin (NEURONTIN) 300 MG capsule Take 300 mg by mouth daily with supper.      losartan (COZAAR) 50 MG tablet Take 1 tablet (50 mg total) by mouth daily. 90 tablet 3   metFORMIN (GLUCOPHAGE-XR) 500 MG 24 hr tablet Take 1-2 tablets (500-1,000 mg total) by mouth See admin instructions. Take 1000 mg in the morning and 500 mg at supper     Multiple Vitamin (MULTIVITAMIN) tablet Take 1 tablet by mouth every morning.      Multiple Vitamins-Minerals (PRESERVISION AREDS) CAPS Take 1 capsule by mouth every evening.     pantoprazole (PROTONIX) 40 MG tablet Take 40 mg by mouth daily with supper.      rosuvastatin (CRESTOR) 20 MG tablet Take 1 tablet (20 mg total) by mouth daily. 90 tablet 3   spironolactone (ALDACTONE) 25 MG tablet Take 25 mg by mouth every morning.      No current facility-administered medications for this visit.    Allergies:   Atenolol, Chantix [varenicline], Hctz [hydrochlorothiazide], Other, and Telepaque [iopanoic acid]   Social History:  The patient  reports that he has been smoking cigarettes. He has a 22.50 pack-year smoking history. He has never used smokeless tobacco. He reports that he does not drink alcohol and does not use drugs.   Family History:  The patient's family history includes Hypertension in his father.    ROS:  Please see the history of present illness.   Otherwise, review of systems is positive for none.   All other systems are reviewed and negative.   PHYSICAL EXAM: VS:  BP 114/80    Pulse 86    Ht 5' 11.5" (1.816 m)    Wt 270 lb (122.5 kg)    SpO2 93%    BMI 37.13 kg/m  , BMI Body mass index is 37.13 kg/m. GEN: Well nourished, well developed, in no acute distress  HEENT: normal   Neck: no JVD, carotid bruits, or masses Cardiac: RRR; no murmurs, rubs, or gallops,no edema  Respiratory:  clear to auscultation bilaterally, normal work of breathing GI: soft, nontender, nondistended, + BS MS: no deformity or atrophy  Skin: warm and dry Neuro:  Strength and sensation are intact Psych: euthymic mood, full affect  EKG:  EKG is ordered today. Personal review of the ekg ordered shows sinus rhythm, rate 86  Recent Labs: 06/21/2021: BUN 17; Creatinine, Ser 0.97; Hemoglobin 15.1; Platelets 190; Potassium 4.4; Sodium 137    Lipid Panel  No results found for: CHOL, TRIG, HDL, CHOLHDL, VLDL, LDLCALC, LDLDIRECT   Wt Readings from Last 3 Encounters:  08/08/21 270 lb (122.5 kg)  06/29/21 275 lb (124.7  kg)  05/26/21 280 lb (127 kg)      Other studies Reviewed: Additional studies/ records that were reviewed today include: LHC 04/26/21  Review of the above records today demonstrates:    Mid LAD lesion is 50% stenosed.   Prox LAD lesion is 50% stenosed.   LPAV lesion is 100% stenosed.  Left to left collaterals feed the distal circumflex.   Prox RCA lesion is 75% stenosed.   Dist LAD lesion is 70% stenosed.   The left ventricular systolic function is normal.   LV end diastolic pressure is normal.   The left ventricular ejection fraction is 55-65% by visual estimate.   There is no aortic valve stenosis.   Tortuous right subclavian artery.  Left dominant system so it is manageable to engage the left main.   ASSESSMENT AND PLAN:  1.  SVT: Status post ablation 06/29/2021 for AVNRT.  He is fortunately remained in sinus rhythm.  He is currently feeling well without complaint.  He is overall happy with his control.  We Sony Schlarb continue with current medications which can be further adjusted by his primary cardiologist.  We Jonita Hirota see him back on an as-needed basis.  2.  Hyperlipidemia: Continue Crestor 20 mg daily per primary cardiology  3.  Obstructive sleep apnea: CPAP  compliance encouraged  4.  Coronary artery disease with chronic stable angina: Currently on diltiazem 240 mg, Crestor 20 mg,  Aspirin 81 mg.  No current chest pain.  Continue with current management.  Current medicines are reviewed at length with the patient today.   The patient does not have concerns regarding his medicines.  The following changes were made today: None  Labs/ tests ordered today include:  Orders Placed This Encounter  Procedures   EKG 12-Lead      Disposition:   FU with Kebra Lowrimore as needed months  Signed, Grover Woodfield Meredith Leeds, MD  08/08/2021 10:52 AM     [place] [address] [phone] (office) [phone] (fax)

## 2021-09-20 DIAGNOSIS — E782 Mixed hyperlipidemia: Secondary | ICD-10-CM | POA: Diagnosis not present

## 2021-09-20 LAB — LIPID PANEL
Chol/HDL Ratio: 3 ratio (ref 0.0–5.0)
Cholesterol, Total: 128 mg/dL (ref 100–199)
HDL: 42 mg/dL (ref >39)
LDL Chol Calc (NIH): 59 mg/dL (ref 0–99)
Triglycerides: 157 mg/dL — ABNORMAL HIGH (ref 0–149)
VLDL Cholesterol Cal: 27 mg/dL (ref 5–40)

## 2021-09-20 LAB — HEPATIC FUNCTION PANEL
ALT: 38 IU/L (ref 0–44)
AST: 31 IU/L (ref 0–40)
Albumin: 4.4 g/dL (ref 3.7–4.7)
Alkaline Phosphatase: 58 IU/L (ref 44–121)
Bilirubin Total: 0.4 mg/dL (ref 0.0–1.2)
Bilirubin, Direct: 0.16 mg/dL (ref 0.00–0.40)
Total Protein: 7.2 g/dL (ref 6.0–8.5)

## 2021-09-25 NOTE — Progress Notes (Unsigned)
Cardiology Office Note   Date:  09/25/2021   ID:  Tanner Garza, DOB 10-Jun-1946, MRN [id number]  PCP:  Shirline Frees, MD    No chief complaint on file.  CAD/SVT  Wt Readings from Last 3 Encounters:  08/08/21 270 lb (122.5 kg)  06/29/21 275 lb (124.7 kg)  05/26/21 280 lb (127 kg)       History of Present Illness: Tanner Garza is a 76 y.o. male  with SVT.  He saw Dr. Wynonia Lawman many years ago.  In 03/2021: "  He went to [facility] ER and was sent home.  HR was high, up to 190-200.  He spontaneously converted.  Troponin was reportedly negative."  Cath in 10/ 2022 showed: "Mid LAD lesion is 50% stenosed.   Prox LAD lesion is 50% stenosed.   LPAV lesion is 100% stenosed.  Left to left collaterals feed the distal circumflex.   Prox RCA lesion is 75% stenosed.   Dist LAD lesion is 70% stenosed.   The left ventricular systolic function is normal.   LV end diastolic pressure is normal.   The left ventricular ejection fraction is 55-65% by visual estimate.   There is no aortic valve stenosis.   Tortuous right subclavian artery.  Left dominant system so it is manageable to engage the left main.   Diffuse LAD and circumflex disease.  Needs more intensive medical therapy for lipid lowering.  Will refer to lipid clinic.  Sx were related to SVT, and were more palpitations so I think his CAD can be managed medically for now.     EP eval planned.  No palpitations since diltiazem was increased. "    Past Medical History:  Diagnosis Date   Anxiety    Bell's palsy 09/2001   BPH (benign prostatic hyperplasia)    Closed fracture of left humerus 12/09/2014   Colon polyp    Complication of anesthesia    Slow to awaken   Dysrhythmia    2 "bouts of tachycardia   Eczema    Esophageal reflux    Fracture of left distal radius 8/33/8250   Hardware complicating wound infection ([place])    Humerus fracture 12/09/2014   Hyperlipidemia    Hypertension    Laceration of middle finger of  left hand without complication 5/39/7673   OSA (obstructive sleep apnea)    uses cpap   Staphylococcal infection    SVT (supraventricular tachycardia) ([place])     Past Surgical History:  Procedure Laterality Date   APPENDECTOMY     CHOLECYSTECTOMY     COLONOSCOPY W/ POLYPECTOMY     EXTERNAL FIXATION REMOVAL Left 07/23/2015   Procedure: REMOVAL EXTERNAL FIXATION LEFT ARM;  Surgeon: Altamese Satanta, MD;  Location: [place];  Service: Orthopedics;  Laterality: Left;   I & D EXTREMITY Left 12/09/2014   Procedure: IRRIGATION AND DEBRIDEMENT LEFT LONG FINGER;  Surgeon: Leandrew Koyanagi, MD;  Location: [place];  Service: Orthopedics;  Laterality: Left;   LEFT HEART CATH AND CORONARY ANGIOGRAPHY N/A 04/26/2021   Procedure: LEFT HEART CATH AND CORONARY ANGIOGRAPHY;  Surgeon: Jettie Booze, MD;  Location: [place] [facility];  Service: Cardiovascular;  Laterality: N/A;   OPEN REDUCTION INTERNAL FIXATION (ORIF) DISTAL RADIAL FRACTURE Left 12/09/2014   Procedure: OPEN REDUCTION INTERNAL FIXATION (ORIF) DISTAL RADIAL FRACTURE;  Surgeon: Leandrew Koyanagi, MD;  Location: [place];  Service: Orthopedics;  Laterality: Left;   ORIF HUMERUS FRACTURE Left 12/09/2014   Procedure: OPEN REDUCTION INTERNAL FIXATION (  ORIF) HUMERAL SHAFT FRACTURE;  Surgeon: Leandrew Koyanagi, MD;  Location: [place];  Service: Orthopedics;  Laterality: Left;   ORIF HUMERUS FRACTURE Left 04/20/2015   Procedure: OPEN REDUCTION INTERNAL FIXATION (ORIF) LEFT HUMERAL SHAFT NONUNION ATTEMPTED, REMOVAL OF PLATE and SCREWS APPLICATION OF EXTERNAL FIXATOR LEFT HUMERUS PLACEMENT OF ANTIBIOTIC BEADS;  Surgeon: Leandrew Koyanagi, MD;  Location: [place];  Service: Orthopedics;  Laterality: Left;   REPLANTATION THUMB Right    SVT ABLATION N/A 06/29/2021   Procedure: SVT ABLATION;  Surgeon: Constance Haw, MD;  Location: [place] [facility];  Service: Cardiovascular;  Laterality: N/A;     Current Outpatient Medications  Medication Sig Dispense Refill   ALPRAZolam (XANAX)  0.5 MG tablet Take 0.5 mg by mouth every morning.     aspirin 81 MG tablet Take 81 mg by mouth every morning.     diclofenac Sodium (VOLTAREN) 1 % GEL Apply 2 g topically daily as needed (pain).     diltiazem (CARDIZEM CD) 240 MG 24 hr capsule Take 1 capsule (240 mg total) by mouth daily. 90 capsule 3   dutasteride (AVODART) 0.5 MG capsule Take 0.5 mg by mouth daily with supper.      gabapentin (NEURONTIN) 300 MG capsule Take 300 mg by mouth daily with supper.      losartan (COZAAR) 50 MG tablet Take 1 tablet (50 mg total) by mouth daily. 90 tablet 3   metFORMIN (GLUCOPHAGE-XR) 500 MG 24 hr tablet Take 1-2 tablets (500-1,000 mg total) by mouth See admin instructions. Take 1000 mg in the morning and 500 mg at supper     Multiple Vitamin (MULTIVITAMIN) tablet Take 1 tablet by mouth every morning.      Multiple Vitamins-Minerals (PRESERVISION AREDS) CAPS Take 1 capsule by mouth every evening.     pantoprazole (PROTONIX) 40 MG tablet Take 40 mg by mouth daily with supper.      rosuvastatin (CRESTOR) 20 MG tablet Take 1 tablet (20 mg total) by mouth daily. 90 tablet 3   spironolactone (ALDACTONE) 25 MG tablet Take 25 mg by mouth every morning.      No current facility-administered medications for this visit.    Allergies:   Atenolol, Chantix [varenicline], Hctz [hydrochlorothiazide], Other, and Telepaque [iopanoic acid]    Social History:  The patient  reports that he has been smoking cigarettes. He has a 22.50 pack-year smoking history. He has never used smokeless tobacco. He reports that he does not drink alcohol and does not use drugs.   Family History:  The patient's ***family history includes Hypertension in his father.    ROS:  Please see the history of present illness.   Otherwise, review of systems are positive for ***.   All other systems are reviewed and negative.    PHYSICAL EXAM: VS:  There were no vitals taken for this visit. , BMI There is no height or weight on file to  calculate BMI. GEN: Well nourished, well developed, in no acute distress HEENT: normal Neck: no JVD, carotid bruits, or masses Cardiac: ***RRR; no murmurs, rubs, or gallops,no edema  Respiratory:  clear to auscultation bilaterally, normal work of breathing GI: soft, nontender, nondistended, + BS MS: no deformity or atrophy Skin: warm and dry, no rash Neuro:  Strength and sensation are intact Psych: euthymic mood, full affect   EKG:   The ekg ordered today demonstrates ***   Recent Labs: 06/21/2021: BUN 17; Creatinine, Ser 0.97; Hemoglobin 15.1; Platelets 190; Potassium 4.4; Sodium 137  09/20/2021: ALT 38   Lipid Panel    Component Value Date/Time   CHOL 128 09/20/2021 0850   TRIG 157 (H) 09/20/2021 0850   HDL 42 09/20/2021 0850   CHOLHDL 3.0 09/20/2021 0850   LDLCALC 59 09/20/2021 0850     Other studies Reviewed: Additional studies/ records that were reviewed today with results demonstrating: ***.   ASSESSMENT AND PLAN:  SVT:  CAD: medically managed.  Hyperlipidemia: Obesity: DM: Tobacco abuse:   Current medicines are reviewed at length with the patient today.  The patient concerns regarding his medicines were addressed.  The following changes have been made:  No change***  Labs/ tests ordered today include: *** No orders of the defined types were placed in this encounter.   Recommend 150 minutes/week of aerobic exercise Low fat, low carb, high fiber diet recommended  Disposition:   FU in ***   Signed, Larae Grooms, MD  09/25/2021 7:12 PM    [facility] [address], [place]  [address] Phone: [phone]; Fax: [phone]

## 2021-09-28 ENCOUNTER — Other Ambulatory Visit: Payer: Self-pay

## 2021-09-28 ENCOUNTER — Encounter: Payer: Self-pay | Admitting: Interventional Cardiology

## 2021-09-28 ENCOUNTER — Ambulatory Visit (INDEPENDENT_AMBULATORY_CARE_PROVIDER_SITE_OTHER): Payer: Medicare Other | Admitting: Interventional Cardiology

## 2021-09-28 VITALS — BP 114/70 | HR 83 | Ht 72.0 in | Wt 270.0 lb

## 2021-09-28 DIAGNOSIS — E782 Mixed hyperlipidemia: Secondary | ICD-10-CM

## 2021-09-28 DIAGNOSIS — I471 Supraventricular tachycardia: Secondary | ICD-10-CM | POA: Diagnosis not present

## 2021-09-28 DIAGNOSIS — E119 Type 2 diabetes mellitus without complications: Secondary | ICD-10-CM

## 2021-09-28 DIAGNOSIS — I25118 Atherosclerotic heart disease of native coronary artery with other forms of angina pectoris: Secondary | ICD-10-CM

## 2021-09-28 DIAGNOSIS — I10 Essential (primary) hypertension: Secondary | ICD-10-CM | POA: Diagnosis not present

## 2021-09-28 MED ORDER — NITROGLYCERIN 0.4 MG SL SUBL: 0.4000 mg | SUBLINGUAL_TABLET | SUBLINGUAL | 6 refills | Status: AC | PRN

## 2021-09-28 NOTE — Patient Instructions (Addendum)
Medication Instructions:  ?Your physician recommends that you continue on your current medications as directed. Please refer to the Current Medication list given to you today. ?A prescription for nitroglycerin has been sent to your pharmacy to use as needed ?*If you need a refill on your cardiac medications before your next appointment, please call your pharmacy* ? ? ?Lab Work: ?none ?If you have labs (blood work) drawn today and your tests are completely normal, you will receive your results only by: ?MyChart Message (if you have MyChart) OR ?A paper copy in the mail ?If you have any lab test that is abnormal or we need to change your treatment, we will call you to review the results. ? ? ?Testing/Procedures: ?none ? ? ?Follow-Up: ?At [facility] you and your health needs are our priority.  As part of our continuing mission to provide you with exceptional heart care, we have created designated Provider Care Teams.  These Care Teams include your primary Cardiologist (physician) and Advanced Practice Providers (APPs -  Physician Assistants and Nurse Practitioners) who all work together to provide you with the care you need, when you need it. ? ?We recommend signing up for the patient portal called "MyChart".  Sign up information is provided on this After Visit Summary.  MyChart is used to connect with patients for Virtual Visits (Telemedicine).  Patients are able to view lab/test results, encounter notes, upcoming appointments, etc.  Non-urgent messages can be sent to your provider as well.   ?To learn more about what you can do with MyChart, go to [email or web address].   ? ?Your next appointment:   ?12 month(s) ? ?The format for your next appointment:   ?In Person ? ?Provider:   ?Larae Grooms, MD   ? ? ?Other Instructions ? ?High-Fiber Eating Plan ?Fiber, also called dietary fiber, is a type of carbohydrate. It is found foods such as fruits, vegetables, whole grains, and beans. A high-fiber diet can have  many health benefits. Your health care provider may recommend a high-fiber diet to help: ?Prevent constipation. Fiber can make your bowel movements more regular. ?Lower your cholesterol. ?Relieve the following conditions: ?Inflammation of veins in the anus (hemorrhoids). ?Inflammation of specific areas of the digestive tract (uncomplicated diverticulosis). ?A problem of the large intestine, also called the colon, that sometimes causes pain and diarrhea (irritable bowel syndrome, or IBS). ?Prevent overeating as part of a weight-loss plan. ?Prevent heart disease, type 2 diabetes, and certain cancers. ?What are tips for following this plan? ?Reading food labels ? ?Check the nutrition facts label on food products for the amount of dietary fiber. Choose foods that have 5 grams of fiber or more per serving. ?The goals for recommended daily fiber intake include: ?Men (age 29 or younger): 34-38 g. ?Men (over age 76): 28-34 g. ?Women (age 35 or younger): 25-28 g. ?Women (over age 59): 22-25 g. ?Your daily fiber goal is _____________ g. ?Shopping ?Choose whole fruits and vegetables instead of processed forms, such as apple juice or applesauce. ?Choose a wide variety of high-fiber foods such as avocados, lentils, oats, and kidney beans. ?Read the nutrition facts label of the foods you choose. Be aware of foods with added fiber. These foods often have high sugar and sodium amounts per serving. ?Cooking ?Use whole-grain flour for baking and cooking. ?Cook with brown rice instead of white rice. ?Meal planning ?Start the day with a breakfast that is high in fiber, such as a cereal that contains 5 g of fiber or more per  serving. ?Eat breads and cereals that are made with whole-grain flour instead of refined flour or white flour. ?Eat brown rice, bulgur wheat, or millet instead of white rice. ?Use beans in place of meat in soups, salads, and pasta dishes. ?Be sure that half of the grains you eat each day are whole grains. ?General  information ?You can get the recommended daily intake of dietary fiber by: ?Eating a variety of fruits, vegetables, grains, nuts, and beans. ?Taking a fiber supplement if you are not able to take in enough fiber in your diet. It is better to get fiber through food than from a supplement. ?Gradually increase how much fiber you consume. If you increase your intake of dietary fiber too quickly, you may have bloating, cramping, or gas. ?Drink plenty of water to help you digest fiber. ?Choose high-fiber snacks, such as berries, raw vegetables, nuts, and popcorn. ?What foods should I eat? ?Fruits ?Berries. Pears. Apples. Oranges. Avocado. Prunes and raisins. Dried figs. ?Vegetables ?Sweet potatoes. Spinach. Kale. Artichokes. Cabbage. Broccoli. Cauliflower. Green peas. Carrots. Squash. ?Grains ?Whole-grain breads. Multigrain cereal. Oats and oatmeal. Brown rice. Barley. Bulgur wheat. [place]. Quinoa. Bran muffins. Popcorn. Rye wafer crackers. ?Meats and other proteins ?Navy beans, kidney beans, and pinto beans. Soybeans. Split peas. Lentils. Nuts and seeds. ?Dairy ?Fiber-fortified yogurt. ?Beverages ?Fiber-fortified soy milk. Fiber-fortified orange juice. ?Other foods ?Fiber bars. ?The items listed above may not be a complete list of recommended foods and beverages. Contact a dietitian for more information. ?What foods should I avoid? ?Fruits ?Fruit juice. Cooked, strained fruit. ?Vegetables ?Fried potatoes. Canned vegetables. Well-cooked vegetables. ?Grains ?White bread. Pasta made with refined flour. White rice. ?Meats and other proteins ?Fatty cuts of meat. Fried chicken or fried fish. ?Dairy ?Milk. Yogurt. Cream cheese. Sour cream. ?Fats and oils ?Butters. ?Beverages ?Soft drinks. ?Other foods ?Cakes and pastries. ?The items listed above may not be a complete list of foods and beverages to avoid. Talk with your dietitian about what choices are best for you. ?Summary ?Fiber is a type of carbohydrate. It is found in foods  such as fruits, vegetables, whole grains, and beans. ?A high-fiber diet has many benefits. It can help to prevent constipation, lower blood cholesterol, aid weight loss, and reduce your risk of heart disease, diabetes, and certain cancers. ?Increase your intake of fiber gradually. Increasing fiber too quickly may cause cramping, bloating, and gas. Drink plenty of water while you increase the amount of fiber you consume. ?The best sources of fiber include whole fruits and vegetables, whole grains, nuts, seeds, and beans. ?This information is not intended to replace advice given to you by your health care provider. Make sure you discuss any questions you have with your health care provider. ?Document Revised: 11/06/2019 Document Reviewed: 11/06/2019 ?Elsevier Patient Education ? [place]. ? ?Nitroglycerin Sublingual Tablets ?What is this medication? ?NITROGLYCERIN (nye troe GLI ser in) prevents and treats chest pain (angina). It works by relaxing blood vessels, which decreases the amount of work the heart has to do. It belongs to a group of medications called nitrates. ?This medicine may be used for other purposes; ask your health care provider or pharmacist if you have questions. ?COMMON BRAND NAME(S): Nitroquick, Nitrostat, Nitrotab ?What should I tell my care team before I take this medication? ?They need to know if you have any of these conditions: ?Anemia ?Head injury, recent stroke, or bleeding in the brain ?Liver disease ?Previous heart attack ?An unusual or allergic reaction to nitroglycerin, other medications, foods, dyes, or  preservatives ?Pregnant or trying to get pregnant ?Breast-feeding ?How should I use this medication? ?Take this medication by mouth as needed. Use at the first sign of an angina attack (chest pain or tightness). You can also take this medication 5 to 10 minutes before an event likely to produce chest pain. Follow the directions exactly as written on the prescription label. Place  one tablet under your tongue and let it dissolve. Do not swallow whole. Replace the dose if you accidentally swallow it. It will help if your mouth is not dry. Saliva around the tablet will help it

## 2021-12-05 DIAGNOSIS — G8929 Other chronic pain: Secondary | ICD-10-CM | POA: Diagnosis not present

## 2021-12-05 DIAGNOSIS — G4733 Obstructive sleep apnea (adult) (pediatric): Secondary | ICD-10-CM | POA: Diagnosis not present

## 2021-12-05 DIAGNOSIS — K219 Gastro-esophageal reflux disease without esophagitis: Secondary | ICD-10-CM | POA: Diagnosis not present

## 2021-12-05 DIAGNOSIS — Z125 Encounter for screening for malignant neoplasm of prostate: Secondary | ICD-10-CM | POA: Diagnosis not present

## 2021-12-05 DIAGNOSIS — I10 Essential (primary) hypertension: Secondary | ICD-10-CM | POA: Diagnosis not present

## 2021-12-05 DIAGNOSIS — N400 Enlarged prostate without lower urinary tract symptoms: Secondary | ICD-10-CM | POA: Diagnosis not present

## 2021-12-05 DIAGNOSIS — E1142 Type 2 diabetes mellitus with diabetic polyneuropathy: Secondary | ICD-10-CM | POA: Diagnosis not present

## 2021-12-05 DIAGNOSIS — Z0000 Encounter for general adult medical examination without abnormal findings: Secondary | ICD-10-CM | POA: Diagnosis not present

## 2021-12-05 DIAGNOSIS — F419 Anxiety disorder, unspecified: Secondary | ICD-10-CM | POA: Diagnosis not present

## 2021-12-05 DIAGNOSIS — E7800 Pure hypercholesterolemia, unspecified: Secondary | ICD-10-CM | POA: Diagnosis not present

## 2021-12-05 DIAGNOSIS — M159 Polyosteoarthritis, unspecified: Secondary | ICD-10-CM | POA: Diagnosis not present

## 2022-01-16 ENCOUNTER — Encounter: Payer: Self-pay | Admitting: Cardiology

## 2022-01-16 ENCOUNTER — Ambulatory Visit (INDEPENDENT_AMBULATORY_CARE_PROVIDER_SITE_OTHER): Payer: Medicare Other | Admitting: Cardiology

## 2022-01-16 VITALS — BP 100/70 | HR 84 | Ht 72.0 in | Wt 264.4 lb

## 2022-01-16 DIAGNOSIS — I471 Supraventricular tachycardia: Secondary | ICD-10-CM | POA: Diagnosis not present

## 2022-01-16 DIAGNOSIS — I25118 Atherosclerotic heart disease of native coronary artery with other forms of angina pectoris: Secondary | ICD-10-CM | POA: Diagnosis not present

## 2022-01-16 NOTE — Progress Notes (Signed)
Electrophysiology Office Note   Date:  01/16/2022   ID:  Tanner Garza, DOB Jul 02, 1946, MRN [id number]  PCP:  Shirline Frees, MD  Cardiologist:  Irish Lack Primary Electrophysiologist:  Sanda Dejoy Meredith Leeds, MD    Chief Complaint: SVT   History of Present Illness: Tanner Garza is a 76 y.o. male who is being seen today for the evaluation of SVT at the request of Shirline Frees, MD. Presenting today for electrophysiology evaluation.  He has a history significant for hypertension, hyperlipidemia, SVT, obstructive sleep apnea on CPAP, coronary artery disease.  He was having episodes of palpitations and went to [facility] and found to have heart rates in the 190s to 200s.  He is status post ablation for AVNRT on 06/29/2021.  Today, denies symptoms of palpitations, chest pain, shortness of breath, orthopnea, PND, lower extremity edema, claudication, dizziness, presyncope, syncope, bleeding, or neurologic sequela. The patient is tolerating medications without difficulties.  Since being seen he has done well.  He is noted no further episodes of SVT.  He is overall quite happy with his control.  Past Medical History:  Diagnosis Date   Anxiety    Bell's palsy 09/2001   BPH (benign prostatic hyperplasia)    Closed fracture of left humerus 12/09/2014   Colon polyp    Complication of anesthesia    Slow to awaken   Dysrhythmia    2 "bouts of tachycardia   Eczema    Esophageal reflux    Fracture of left distal radius 5/95/6387   Hardware complicating wound infection ([place])    Humerus fracture 12/09/2014   Hyperlipidemia    Hypertension    Laceration of middle finger of left hand without complication 5/64/3329   OSA (obstructive sleep apnea)    uses cpap   Staphylococcal infection    SVT (supraventricular tachycardia) ([place])    Past Surgical History:  Procedure Laterality Date   APPENDECTOMY     CHOLECYSTECTOMY     COLONOSCOPY W/ POLYPECTOMY     EXTERNAL FIXATION REMOVAL  Left 07/23/2015   Procedure: REMOVAL EXTERNAL FIXATION LEFT ARM;  Surgeon: Altamese [place], MD;  Location: [place];  Service: Orthopedics;  Laterality: Left;   I & D EXTREMITY Left 12/09/2014   Procedure: IRRIGATION AND DEBRIDEMENT LEFT LONG FINGER;  Surgeon: Leandrew Koyanagi, MD;  Location: [place];  Service: Orthopedics;  Laterality: Left;   LEFT HEART CATH AND CORONARY ANGIOGRAPHY N/A 04/26/2021   Procedure: LEFT HEART CATH AND CORONARY ANGIOGRAPHY;  Surgeon: Jettie Booze, MD;  Location: [place] [facility];  Service: Cardiovascular;  Laterality: N/A;   OPEN REDUCTION INTERNAL FIXATION (ORIF) DISTAL RADIAL FRACTURE Left 12/09/2014   Procedure: OPEN REDUCTION INTERNAL FIXATION (ORIF) DISTAL RADIAL FRACTURE;  Surgeon: Leandrew Koyanagi, MD;  Location: [place];  Service: Orthopedics;  Laterality: Left;   ORIF HUMERUS FRACTURE Left 12/09/2014   Procedure: OPEN REDUCTION INTERNAL FIXATION (ORIF) HUMERAL SHAFT FRACTURE;  Surgeon: Leandrew Koyanagi, MD;  Location: [place];  Service: Orthopedics;  Laterality: Left;   ORIF HUMERUS FRACTURE Left 04/20/2015   Procedure: OPEN REDUCTION INTERNAL FIXATION (ORIF) LEFT HUMERAL SHAFT NONUNION ATTEMPTED, REMOVAL OF PLATE and SCREWS APPLICATION OF EXTERNAL FIXATOR LEFT HUMERUS PLACEMENT OF ANTIBIOTIC BEADS;  Surgeon: Leandrew Koyanagi, MD;  Location: [place];  Service: Orthopedics;  Laterality: Left;   REPLANTATION THUMB Right    SVT ABLATION N/A 06/29/2021   Procedure: SVT ABLATION;  Surgeon: Constance Haw, MD;  Location: [place] [facility];  Service: Cardiovascular;  Laterality: N/A;     Current Outpatient Medications  Medication Sig Dispense Refill   ALPRAZolam (XANAX) 0.5 MG tablet Take 0.5 mg by mouth every morning.     aspirin 81 MG tablet Take 81 mg by mouth every morning.     diclofenac Sodium (VOLTAREN) 1 % GEL Apply 2 g topically daily as needed (pain).     diltiazem (CARDIZEM CD) 240 MG 24 hr capsule Take 1 capsule (240 mg total) by mouth daily. 90 capsule 3    dutasteride (AVODART) 0.5 MG capsule Take 0.5 mg by mouth daily with supper.      empagliflozin (JARDIANCE) 25 MG TABS tablet Take 1 tablet by mouth daily.     gabapentin (NEURONTIN) 300 MG capsule Take 300 mg by mouth daily with supper.      losartan (COZAAR) 50 MG tablet Take 1 tablet (50 mg total) by mouth daily. 90 tablet 3   metFORMIN (GLUCOPHAGE-XR) 500 MG 24 hr tablet Take 1-2 tablets (500-1,000 mg total) by mouth See admin instructions. Take 1000 mg in the morning and 500 mg at supper     Multiple Vitamin (MULTIVITAMIN) tablet Take 1 tablet by mouth every morning.      Multiple Vitamins-Minerals (PRESERVISION AREDS) CAPS Take 1 capsule by mouth every evening.     nitroGLYCERIN (NITROSTAT) 0.4 MG SL tablet Place 1 tablet (0.4 mg total) under the tongue every 5 (five) minutes as needed for chest pain. 25 tablet 6   pantoprazole (PROTONIX) 40 MG tablet Take 40 mg by mouth every other day.     rosuvastatin (CRESTOR) 20 MG tablet Take 1 tablet (20 mg total) by mouth daily. 90 tablet 3   spironolactone (ALDACTONE) 25 MG tablet Take 25 mg by mouth every morning.      No current facility-administered medications for this visit.    Allergies:   Atenolol, Chantix [varenicline], Hctz [hydrochlorothiazide], Other, and Telepaque [iopanoic acid]   Social History:  The patient  reports that he has been smoking cigarettes. He has a 22.50 pack-year smoking history. He has never used smokeless tobacco. He reports that he does not drink alcohol and does not use drugs.   Family History:  The patient's family history includes Hypertension in his father.   ROS:  Please see the history of present illness.   Otherwise, review of systems is positive for none.   All other systems are reviewed and negative.   PHYSICAL EXAM: VS:  BP 100/70   Pulse 84   Ht 6' (1.829 m)   Wt 264 lb 6.4 oz (119.9 kg)   SpO2 95%   BMI 35.86 kg/m  , BMI Body mass index is 35.86 kg/m. GEN: Well nourished, well developed, in no  acute distress  HEENT: normal  Neck: no JVD, carotid bruits, or masses Cardiac: RRR; no murmurs, rubs, or gallops,no edema  Respiratory:  clear to auscultation bilaterally, normal work of breathing GI: soft, nontender, nondistended, + BS MS: no deformity or atrophy  Skin: warm and dry Neuro:  Strength and sensation are intact Psych: euthymic mood, full affect  EKG:  EKG is ordered today. Personal review of the ekg ordered shows sinus rhythm, rate 84   Recent Labs: 06/21/2021: BUN 17; Creatinine, Ser 0.97; Hemoglobin 15.1; Platelets 190; Potassium 4.4; Sodium 137 09/20/2021: ALT 38    Lipid Panel     Component Value Date/Time   CHOL 128 09/20/2021 0850   TRIG 157 (H) 09/20/2021 0850   HDL 42 09/20/2021 0850   CHOLHDL  3.0 09/20/2021 0850   LDLCALC 59 09/20/2021 0850     Wt Readings from Last 3 Encounters:  01/16/22 264 lb 6.4 oz (119.9 kg)  09/28/21 270 lb (122.5 kg)  08/08/21 270 lb (122.5 kg)      Other studies Reviewed: Additional studies/ records that were reviewed today include: LHC 04/26/21  Review of the above records today demonstrates:    Mid LAD lesion is 50% stenosed.   Prox LAD lesion is 50% stenosed.   LPAV lesion is 100% stenosed.  Left to left collaterals feed the distal circumflex.   Prox RCA lesion is 75% stenosed.   Dist LAD lesion is 70% stenosed.   The left ventricular systolic function is normal.   LV end diastolic pressure is normal.   The left ventricular ejection fraction is 55-65% by visual estimate.   There is no aortic valve stenosis.   Tortuous right subclavian artery.  Left dominant system so it is manageable to engage the left main.   ASSESSMENT AND PLAN:  1.  SVT: Status post ablation on 06/29/2021 for AVNRT.  Has had no further arrhythmias.  We Adelei Scobey see him back as needed.  2.  Hyperlipidemia: Continue Crestor 20 mg per primary cardiology  3.  Obstructive sleep apnea: CPAP compliance encouraged  4.  Coronary artery disease with  chronic stable angina: Currently on diltiazem to 40 mg daily, Crestor 20 mg daily, aspirin 81 mg daily.  No current chest pain.  Current medicines are reviewed at length with the patient today.   The patient does not have concerns regarding his medicines.  The following changes were made today: None  Labs/ tests ordered today include:  Orders Placed This Encounter  Procedures   EKG 12-Lead      Disposition:   FU with Justun Anaya as needed months  Signed, Elizebath Wever Meredith Leeds, MD  01/16/2022 11:40 AM     [address] [place] [address] [phone] (office) [phone] (fax)

## 2022-03-07 DIAGNOSIS — E1142 Type 2 diabetes mellitus with diabetic polyneuropathy: Secondary | ICD-10-CM | POA: Diagnosis not present

## 2022-03-07 DIAGNOSIS — F419 Anxiety disorder, unspecified: Secondary | ICD-10-CM | POA: Diagnosis not present

## 2022-03-07 DIAGNOSIS — I10 Essential (primary) hypertension: Secondary | ICD-10-CM | POA: Diagnosis not present

## 2022-03-07 DIAGNOSIS — E7800 Pure hypercholesterolemia, unspecified: Secondary | ICD-10-CM | POA: Diagnosis not present

## 2022-03-07 DIAGNOSIS — G8929 Other chronic pain: Secondary | ICD-10-CM | POA: Diagnosis not present

## 2022-03-07 DIAGNOSIS — R5383 Other fatigue: Secondary | ICD-10-CM | POA: Diagnosis not present

## 2022-04-03 ENCOUNTER — Other Ambulatory Visit: Payer: Self-pay | Admitting: Interventional Cardiology

## 2022-04-06 ENCOUNTER — Telehealth: Payer: Self-pay | Admitting: *Deleted

## 2022-04-06 NOTE — Chronic Care Management (AMB) (Signed)
  Care Coordination   Note   04/06/2022 Name: Tanner Garza MRN: [id number] DOB: 1946-03-18  Tanner Garza is a 76 y.o. year old male who sees Shirline Frees, MD for primary care. I reached out to Garnette Czech by phone today to offer care coordination services.  Tanner Garza was given information about Care Coordination services today including:   The Care Coordination services include support from the care team which includes your Nurse Coordinator, Clinical Social Worker, or Pharmacist.  The Care Coordination team is here to help remove barriers to the health concerns and goals most important to you. Care Coordination services are voluntary, and the patient may decline or stop services at any time by request to their care team member.   Care Coordination Consent Status: Patient did not agree to participate in care coordination services at this time.    Encounter Outcome:  Pt. Refused  [place]  Direct Dial: [phone]

## 2022-04-21 DIAGNOSIS — I10 Essential (primary) hypertension: Secondary | ICD-10-CM | POA: Diagnosis not present

## 2022-05-10 ENCOUNTER — Other Ambulatory Visit: Payer: Self-pay | Admitting: Interventional Cardiology

## 2022-06-22 DIAGNOSIS — Z23 Encounter for immunization: Secondary | ICD-10-CM | POA: Diagnosis not present

## 2022-06-22 DIAGNOSIS — G8929 Other chronic pain: Secondary | ICD-10-CM | POA: Diagnosis not present

## 2022-06-22 DIAGNOSIS — E1142 Type 2 diabetes mellitus with diabetic polyneuropathy: Secondary | ICD-10-CM | POA: Diagnosis not present

## 2022-06-22 DIAGNOSIS — I10 Essential (primary) hypertension: Secondary | ICD-10-CM | POA: Diagnosis not present

## 2022-06-22 DIAGNOSIS — E7800 Pure hypercholesterolemia, unspecified: Secondary | ICD-10-CM | POA: Diagnosis not present

## 2022-06-22 DIAGNOSIS — F419 Anxiety disorder, unspecified: Secondary | ICD-10-CM | POA: Diagnosis not present

## 2022-06-22 DIAGNOSIS — K219 Gastro-esophageal reflux disease without esophagitis: Secondary | ICD-10-CM | POA: Diagnosis not present

## 2022-07-24 DIAGNOSIS — K080 Exfoliation of teeth due to systemic causes: Secondary | ICD-10-CM | POA: Diagnosis not present

## 2022-08-11 DIAGNOSIS — H524 Presbyopia: Secondary | ICD-10-CM | POA: Diagnosis not present

## 2022-08-11 DIAGNOSIS — H40033 Anatomical narrow angle, bilateral: Secondary | ICD-10-CM | POA: Diagnosis not present

## 2022-08-11 DIAGNOSIS — H5203 Hypermetropia, bilateral: Secondary | ICD-10-CM | POA: Diagnosis not present

## 2022-08-11 DIAGNOSIS — E113291 Type 2 diabetes mellitus with mild nonproliferative diabetic retinopathy without macular edema, right eye: Secondary | ICD-10-CM | POA: Diagnosis not present

## 2022-08-11 DIAGNOSIS — Z7984 Long term (current) use of oral hypoglycemic drugs: Secondary | ICD-10-CM | POA: Diagnosis not present

## 2022-08-11 DIAGNOSIS — H40053 Ocular hypertension, bilateral: Secondary | ICD-10-CM | POA: Diagnosis not present

## 2022-08-11 DIAGNOSIS — H52203 Unspecified astigmatism, bilateral: Secondary | ICD-10-CM | POA: Diagnosis not present

## 2022-09-25 DIAGNOSIS — G4733 Obstructive sleep apnea (adult) (pediatric): Secondary | ICD-10-CM | POA: Diagnosis not present

## 2022-11-06 DIAGNOSIS — G4733 Obstructive sleep apnea (adult) (pediatric): Secondary | ICD-10-CM | POA: Diagnosis not present

## 2022-12-13 DIAGNOSIS — I10 Essential (primary) hypertension: Secondary | ICD-10-CM | POA: Diagnosis not present

## 2022-12-13 DIAGNOSIS — E7800 Pure hypercholesterolemia, unspecified: Secondary | ICD-10-CM | POA: Diagnosis not present

## 2022-12-13 DIAGNOSIS — E1142 Type 2 diabetes mellitus with diabetic polyneuropathy: Secondary | ICD-10-CM | POA: Diagnosis not present

## 2022-12-13 DIAGNOSIS — Z9989 Dependence on other enabling machines and devices: Secondary | ICD-10-CM | POA: Diagnosis not present

## 2022-12-13 DIAGNOSIS — Z125 Encounter for screening for malignant neoplasm of prostate: Secondary | ICD-10-CM | POA: Diagnosis not present

## 2022-12-13 DIAGNOSIS — Z0000 Encounter for general adult medical examination without abnormal findings: Secondary | ICD-10-CM | POA: Diagnosis not present

## 2023-06-22 DIAGNOSIS — E1142 Type 2 diabetes mellitus with diabetic polyneuropathy: Secondary | ICD-10-CM | POA: Diagnosis not present

## 2023-06-22 DIAGNOSIS — I10 Essential (primary) hypertension: Secondary | ICD-10-CM | POA: Diagnosis not present

## 2023-06-22 DIAGNOSIS — E1165 Type 2 diabetes mellitus with hyperglycemia: Secondary | ICD-10-CM | POA: Diagnosis not present

## 2023-06-22 DIAGNOSIS — Z23 Encounter for immunization: Secondary | ICD-10-CM | POA: Diagnosis not present

## 2023-06-22 DIAGNOSIS — E7800 Pure hypercholesterolemia, unspecified: Secondary | ICD-10-CM | POA: Diagnosis not present

## 2023-08-31 DIAGNOSIS — H40053 Ocular hypertension, bilateral: Secondary | ICD-10-CM | POA: Diagnosis not present

## 2023-08-31 DIAGNOSIS — H2513 Age-related nuclear cataract, bilateral: Secondary | ICD-10-CM | POA: Diagnosis not present

## 2023-08-31 DIAGNOSIS — E113291 Type 2 diabetes mellitus with mild nonproliferative diabetic retinopathy without macular edema, right eye: Secondary | ICD-10-CM | POA: Diagnosis not present

## 2023-08-31 DIAGNOSIS — H40033 Anatomical narrow angle, bilateral: Secondary | ICD-10-CM | POA: Diagnosis not present

## 2023-10-01 DIAGNOSIS — G4733 Obstructive sleep apnea (adult) (pediatric): Secondary | ICD-10-CM | POA: Diagnosis not present

## 2023-12-20 DIAGNOSIS — G8929 Other chronic pain: Secondary | ICD-10-CM | POA: Diagnosis not present

## 2023-12-20 DIAGNOSIS — R5383 Other fatigue: Secondary | ICD-10-CM | POA: Diagnosis not present

## 2023-12-20 DIAGNOSIS — Z0000 Encounter for general adult medical examination without abnormal findings: Secondary | ICD-10-CM | POA: Diagnosis not present

## 2023-12-20 DIAGNOSIS — E7800 Pure hypercholesterolemia, unspecified: Secondary | ICD-10-CM | POA: Diagnosis not present

## 2023-12-20 DIAGNOSIS — I10 Essential (primary) hypertension: Secondary | ICD-10-CM | POA: Diagnosis not present

## 2023-12-20 DIAGNOSIS — Z125 Encounter for screening for malignant neoplasm of prostate: Secondary | ICD-10-CM | POA: Diagnosis not present

## 2023-12-20 DIAGNOSIS — E1142 Type 2 diabetes mellitus with diabetic polyneuropathy: Secondary | ICD-10-CM | POA: Diagnosis not present

## 2024-02-28 DIAGNOSIS — M549 Dorsalgia, unspecified: Secondary | ICD-10-CM | POA: Diagnosis not present

## 2024-06-06 ENCOUNTER — Other Ambulatory Visit: Payer: Self-pay | Admitting: Family Medicine

## 2024-06-06 DIAGNOSIS — M898X1 Other specified disorders of bone, shoulder: Secondary | ICD-10-CM

## 2024-06-18 ENCOUNTER — Other Ambulatory Visit (HOSPITAL_BASED_OUTPATIENT_CLINIC_OR_DEPARTMENT_OTHER): Payer: Self-pay | Admitting: Family Medicine

## 2024-06-18 DIAGNOSIS — M898X1 Other specified disorders of bone, shoulder: Secondary | ICD-10-CM

## 2024-06-19 ENCOUNTER — Ambulatory Visit (HOSPITAL_BASED_OUTPATIENT_CLINIC_OR_DEPARTMENT_OTHER): Admission: RE | Admit: 2024-06-19

## 2024-06-19 ENCOUNTER — Ambulatory Visit (HOSPITAL_BASED_OUTPATIENT_CLINIC_OR_DEPARTMENT_OTHER)
Admission: RE | Admit: 2024-06-19 | Discharge: 2024-06-19 | Disposition: A | Source: Ambulatory Visit | Attending: Family Medicine | Admitting: Family Medicine

## 2024-06-19 DIAGNOSIS — M898X1 Other specified disorders of bone, shoulder: Secondary | ICD-10-CM | POA: Insufficient documentation

## 2024-06-19 DIAGNOSIS — I7123 Aneurysm of the descending thoracic aorta, without rupture: Secondary | ICD-10-CM | POA: Diagnosis not present

## 2024-06-19 DIAGNOSIS — I700 Atherosclerosis of aorta: Secondary | ICD-10-CM | POA: Insufficient documentation

## 2024-06-19 LAB — POCT I-STAT CREATININE: Creatinine, Ser: 1.2 mg/dL (ref 0.61–1.24)

## 2024-06-19 MED ORDER — IOHEXOL 300 MG/ML  SOLN
80.0000 mL | Freq: Once | INTRAMUSCULAR | Status: AC | PRN
  Administered 2024-06-19: 80 mL via INTRAVENOUS

## 2024-06-23 ENCOUNTER — Ambulatory Visit (HOSPITAL_BASED_OUTPATIENT_CLINIC_OR_DEPARTMENT_OTHER)

## 2024-07-30 ENCOUNTER — Ambulatory Visit

## 2024-07-30 VITALS — BP 135/76 | HR 83 | Resp 20 | Ht 72.0 in | Wt 255.0 lb

## 2024-07-30 DIAGNOSIS — I7123 Aneurysm of the descending thoracic aorta, without rupture: Secondary | ICD-10-CM

## 2024-07-30 NOTE — Progress Notes (Signed)
 "      [address] [place] [address]             [phone]            Tanner Garza [id number] 08-Mar-1946   History of Present Illness:  Tanner Garza is a 79 year old man with medical history of CAD, hypertension, hyperlipidemia, type 2 diabetes, current user of tobacco, mid back pain on the right side, OSA, and anxiety who presents for initial encounter for descending thoracic aortic aneurysm. This was found incidentally when he had a CT scan done for right sided mid upper back pain.  Descending thoracic aortic aneurysm measured 4.8 cm.   He presents to the clinic today and reports that he is doing well. His blood pressure is well controlled with current medication therapy. 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  He is a current everyday smoker and smokes around 1 pack every 3 days. He does have a lipoma on his right side which he thinks is causing his upper back pain. He denies chest pain, shortness of breath and lower leg edema.   Medications Ordered Prior to Encounter[1]   ROS: Review of Systems  Constitutional:  Negative for fever and malaise/fatigue.  Respiratory:  Negative for cough and shortness of breath.   Cardiovascular:  Negative for chest pain, palpitations and leg swelling.  Musculoskeletal:  Positive for back pain.  Skin:        lipoma     BP 135/76   Pulse 83   Resp 20   Ht 6' (1.829 m)   Wt 255 lb (115.7 kg)   SpO2 96% Comment: RA  BMI 34.58 kg/m   Physical Exam Constitutional:      Appearance: Normal appearance.  HENT:     Head: Normocephalic and atraumatic.  Cardiovascular:     Rate and Rhythm: Normal rate and regular rhythm.     Heart sounds: Normal heart sounds, S1 normal and S2 normal.  Pulmonary:     Effort: Pulmonary effort is normal.     Breath sounds: Normal breath sounds.  Skin:    General: Skin is warm and dry.      Neurological:     General: No focal deficit present.     Mental  Status: He is alert and oriented to person, place, and time.      Imaging:  CLINICAL DATA:  History provided by technologist Back pain around right scapular area.   EXAM: CT CHEST WITH CONTRAST   TECHNIQUE: Multidetector CT imaging of the chest was performed during intravenous contrast administration.   RADIATION DOSE REDUCTION: This exam was performed according to the departmental dose-optimization program which includes automated exposure control, adjustment of the mA and/or kV according to patient size and/or use of iterative reconstruction technique.   CONTRAST:  80mL OMNIPAQUE  IOHEXOL  300 MG/ML  SOLN   COMPARISON:  CT angiography chest from 11/08/2006.   FINDINGS: Cardiovascular: Normal cardiac size. No pericardial effusion. There is fusiform aneurysmal dilation of descending thoracic aorta measuring up to 4.8 cm in diameter. There are coronary artery calcifications, in keeping with coronary artery disease. There are also mild-to-moderate peripheral atherosclerotic vascular calcifications of thoracic aorta and its major branches.   Mediastinum/Nodes: Visualized thyroid  gland appears grossly unremarkable. No solid / cystic mediastinal masses. The esophagus is nondistended precluding optimal assessment. There are few mildly prominent mediastinal lymph nodes, which do not meet the size criteria  for lymphadenopathy and appear decreased in size since the prior study, favoring benign etiology. No axillary or hilar lymphadenopathy by size criteria.   Lungs/Pleura: The central tracheo-bronchial tree is patent. There are dependent changes in bilateral lungs. No mass or consolidation. No pleural effusion or pneumothorax. No suspicious lung nodules.   Upper Abdomen: Surgically absent gallbladder. Tiny sliding hiatal hernia. Visualized upper abdominal viscera within normal limits.   Musculoskeletal: The visualized soft tissues of the chest wall are grossly unremarkable. No  suspicious osseous lesions. There are mild multilevel degenerative changes in the visualized spine. No focal abnormality of the scapular bones.   IMPRESSION: 1. No focal abnormality of the scapular bones. 2. No lung mass, consolidation, pleural effusion or pneumothorax. No suspicious lung nodule. 3. Fusiform aneurysmal dilation of descending thoracic aorta measuring up to 4.8 cm. Cardiovascular consultation is recommended. 4. Multiple other nonacute observations, as described above.   Aortic aneurysm NOS (ICD10-I71.9).   Aortic Atherosclerosis (ICD10-I70.0).     Electronically Signed   By: Ree Molt M.D.   On: 06/20/2024 08:43    A/P: Aneurysm of descending thoracic aorta without rupture -4.8 cm descending thoracic aortic aneurysm on CT scan of chest -We discussed the natural history and and risk factors for growth of aortic aneurysms. Discussed recommendations to minimize the risk of further expansion or dissection including careful blood pressure control, avoidance of contact sports and heavy lifting, attention to lipid management.  We covered the importance of smoking cessation. The patient is aware of signs and symptoms of aortic dissection and when to present to the emergency department  -Referral placed to vascular surgery at this time     Risk Modification:  Statin:  rosuvastatin    Smoking cessation instruction/counseling given:  counseled patient on the dangers of tobacco use, advised patient to stop smoking, and reviewed strategies to maximize success.   Patient was counseled on importance of Blood Pressure Control  They are instructed to contact their Primary Care Physician if they start to have blood pressure readings over 130s/90s. Do not ever stop blood pressure medications on your own, unless instructed by healthcare professional.  Please avoid use of Fluoroquinolones as this can potentially increase your risk of Aortic Rupture and/or Dissection  Patient  educated on signs and symptoms of Aortic Dissection, handout also provided in AVS  Tanner CHRISTELLA Rough, PA-C 07/31/24     [1]  Current Outpatient Medications on File Prior to Visit  Medication Sig Dispense Refill   ALPRAZolam  (XANAX ) 0.5 MG tablet Take 0.5 mg by mouth every morning.     aspirin  81 MG tablet Take 81 mg by mouth every morning.     diclofenac Sodium (VOLTAREN) 1 % GEL Apply 2 g topically daily as needed (pain).     diltiazem  (CARDIZEM  CD) 240 MG 24 hr capsule TAKE 1 CAPSULE BY MOUTH ONCE DAILY. 90 capsule 3   diltiazem  (TIAZAC ) 240 MG 24 hr capsule Take 240 mg by mouth daily.     dutasteride  (AVODART ) 0.5 MG capsule Take 0.5 mg by mouth daily with supper.      gabapentin  (NEURONTIN ) 300 MG capsule Take 300 mg by mouth daily with supper.      losartan  (COZAAR ) 50 MG tablet TAKE 1 TABLET BY MOUTH ONCE DAILY. 90 tablet 3   metFORMIN  (GLUCOPHAGE -XR) 500 MG 24 hr tablet Take 1-2 tablets (500-1,000 mg total) by mouth See admin instructions. Take 1000 mg in the morning and 500 mg at supper     methocarbamol  (ROBAXIN ) 750  MG tablet 1 tablet Orally every 4 hrs; Duration: 30 day(s)     Multiple Vitamin (MULTIVITAMIN) tablet Take 1 tablet by mouth every morning.      Multiple Vitamins-Minerals (PRESERVISION AREDS) CAPS Take 1 capsule by mouth every evening.     nitroGLYCERIN  (NITROSTAT ) 0.4 MG SL tablet Place 1 tablet (0.4 mg total) under the tongue every 5 (five) minutes as needed for chest pain. 25 tablet 6   pantoprazole  (PROTONIX ) 40 MG tablet Take 40 mg by mouth every other day.     rosuvastatin  (CRESTOR ) 20 MG tablet TAKE 1 TABLET BY MOUTH ONCE DAILY 90 tablet 2   spironolactone  (ALDACTONE ) 25 MG tablet Take 25 mg by mouth every morning.      traMADol (ULTRAM) 50 MG tablet 1 tablet as needed Orally Once a day for pain; Duration: 30 days     FARXIGA 10 MG TABS tablet Take 10 mg by mouth daily.     No current facility-administered medications on file prior to visit.   "

## 2024-07-31 NOTE — Patient Instructions (Signed)
-  Referral placed to vascular surgery at this time  Risk Modification in those with thoracic aortic aneurysm:   Continue control of blood pressure (prefer BP 130/80 or less)   2. Avoid fluoroquinolone antibiotics (I.e Ciprofloxacin, Avelox, Levofloxacin, Ofloxacin)   3.  Use of statin (to decrease cardiovascular risk)   4.  Exercise and activity limitations is individualized, but in general, contact sports are to be avoided and one should avoid heavy lifting (defined as half of ideal body weight) and exercises involving sustained Valsalva maneuver.   5.  Follow-up with vascular surgery

## 2024-08-04 ENCOUNTER — Encounter: Payer: Self-pay | Admitting: Surgery

## 2024-08-04 ENCOUNTER — Ambulatory Visit: Admitting: Surgery

## 2024-08-04 VITALS — BP 133/84 | HR 76 | Temp 98.0°F | Ht 72.0 in | Wt 250.0 lb

## 2024-08-04 DIAGNOSIS — I7123 Aneurysm of the descending thoracic aorta, without rupture: Secondary | ICD-10-CM | POA: Diagnosis not present

## 2024-08-04 NOTE — Progress Notes (Signed)
 "                                   [facility]  Patient name: Tanner Garza MRN: [id number] DOB: Apr 23, 1946 Sex: male   REQUESTING PROVIDER:    Manuelita Rough   REASON FOR CONSULT:    TAAA  HISTORY OF PRESENT ILLNESS:   Tanner Garza is a 79 y.o. male, who is referred for evaluation of a descending thoracic aortic aneurysm.  This was an incidental finding on a scan done to evaluate a lipoma.  He is medically managed for hypertension.  He takes a statin for hypercholesterolemia.  He is a current smoker.  He has had cardiac ablation in the past  PAST MEDICAL HISTORY    Past Medical History:  Diagnosis Date   AAA (abdominal aortic aneurysm)    Anxiety    Bell's palsy 09/2001   BPH (benign prostatic hyperplasia)    Closed fracture of left humerus 12/09/2014   Colon polyp    Complication of anesthesia    Slow to awaken   Dysrhythmia    2 bouts of tachycardia   Eczema    Esophageal reflux    Fracture of left distal radius 12/09/2014   Hardware complicating wound infection    Humerus fracture 12/09/2014   Hyperlipidemia    Hypertension    Laceration of middle finger of left hand without complication 12/09/2014   OSA (obstructive sleep apnea)    uses cpap   Staphylococcal infection    SVT (supraventricular tachycardia)      FAMILY HISTORY   Family History  Problem Relation Age of Onset   Hypertension Father     SOCIAL HISTORY:   Social History   Socioeconomic History   Marital status: Married    Spouse name: Not on file   Number of children: Not on file   Years of education: Not on file   Highest education level: Not on file  Occupational History   Not on file  Tobacco Use   Smoking status: Every Day    Current packs/day: 0.50    Average packs/day: 0.5 packs/day for 45.0 years (22.5 ttl pk-yrs)    Types: Cigarettes   Smokeless tobacco: Never  Vaping Use   Vaping status: Never Used  Substance and Sexual  Activity   Alcohol use: No    Alcohol/week: 0.0 standard drinks of alcohol   Drug use: No   Sexual activity: Not on file  Other Topics Concern   Not on file  Social History Narrative   Not on file   Social Drivers of Health   Tobacco Use: High Risk (08/04/2024)   Patient History    Smoking Tobacco Use: Every Day    Smokeless Tobacco Use: Never    Passive Exposure: Not on file  Financial Resource Strain: Not on file  Food Insecurity: Not on file  Transportation Needs: Not on file  Physical Activity: Not on file  Stress: Not on file  Social Connections: Not on file  Intimate Partner Violence: Not on file  Depression (EYV7-0): Not on file  Alcohol Screen: Not on file  Housing: Not on file  Utilities: Not on file  Health Literacy: Not on file    ALLERGIES:    Allergies[1]  CURRENT MEDICATIONS:    Current Outpatient Medications  Medication Sig Dispense Refill   ALPRAZolam  (XANAX ) 0.5 MG tablet Take 0.5 mg  by mouth every morning.     aspirin  81 MG tablet Take 81 mg by mouth every morning.     diclofenac Sodium (VOLTAREN) 1 % GEL Apply 2 g topically daily as needed (pain).     diltiazem  (CARDIZEM  CD) 240 MG 24 hr capsule TAKE 1 CAPSULE BY MOUTH ONCE DAILY. 90 capsule 3   diltiazem  (TIAZAC ) 240 MG 24 hr capsule Take 240 mg by mouth daily.     dutasteride  (AVODART ) 0.5 MG capsule Take 0.5 mg by mouth daily with supper.      FARXIGA 10 MG TABS tablet Take 10 mg by mouth daily.     gabapentin  (NEURONTIN ) 300 MG capsule Take 300 mg by mouth daily with supper.      losartan  (COZAAR ) 50 MG tablet TAKE 1 TABLET BY MOUTH ONCE DAILY. 90 tablet 3   metFORMIN  (GLUCOPHAGE -XR) 500 MG 24 hr tablet Take 1-2 tablets (500-1,000 mg total) by mouth See admin instructions. Take 1000 mg in the morning and 500 mg at supper     methocarbamol  (ROBAXIN ) 750 MG tablet 1 tablet Orally every 4 hrs; Duration: 30 day(s)     Multiple Vitamin (MULTIVITAMIN) tablet Take 1 tablet by mouth every morning.       Multiple Vitamins-Minerals (PRESERVISION AREDS) CAPS Take 1 capsule by mouth every evening.     nitroGLYCERIN  (NITROSTAT ) 0.4 MG SL tablet Place 1 tablet (0.4 mg total) under the tongue every 5 (five) minutes as needed for chest pain. 25 tablet 6   pantoprazole  (PROTONIX ) 40 MG tablet Take 40 mg by mouth every other day.     rosuvastatin  (CRESTOR ) 20 MG tablet TAKE 1 TABLET BY MOUTH ONCE DAILY 90 tablet 2   spironolactone  (ALDACTONE ) 25 MG tablet Take 25 mg by mouth every morning.      traMADol (ULTRAM) 50 MG tablet 1 tablet as needed Orally Once a day for pain; Duration: 30 days     No current facility-administered medications for this visit.    REVIEW OF SYSTEMS:   [X]  denotes positive finding, [ ]  denotes negative finding Cardiac  Comments:  Chest pain or chest pressure:    Shortness of breath upon exertion:    Short of breath when lying flat:    Irregular heart rhythm:        Vascular    Pain in calf, thigh, or hip brought on by ambulation:    Pain in feet at night that wakes you up from your sleep:     Blood clot in your veins:    Leg swelling:         Pulmonary    Oxygen at home:    Productive cough:     Wheezing:         Neurologic    Sudden weakness in arms or legs:     Sudden numbness in arms or legs:     Sudden onset of difficulty speaking or slurred speech:    Temporary loss of vision in one eye:     Problems with dizziness:         Gastrointestinal    Blood in stool:      Vomited blood:         Genitourinary    Burning when urinating:     Blood in urine:        Psychiatric    Major depression:         Hematologic    Bleeding problems:    Problems with blood clotting too easily:  Skin    Rashes or ulcers:        Constitutional    Fever or chills:     PHYSICAL EXAM:   Vitals:   08/04/24 1139  BP: 133/84  Pulse: 76  Temp: 98 F (36.7 C)  SpO2: 96%  Weight: 250 lb (113.4 kg)  Height: 6' (1.829 m)    GENERAL: The patient is a  well-nourished male, in no acute distress. The vital signs are documented above. CARDIAC: There is a regular rate and rhythm.  VASCULAR: Palpable radial pulses PULMONARY: Nonlabored respirations ABDOMEN: Soft and non-tender, right flank lipoma MUSCULOSKELETAL: There are no major deformities or cyanosis. NEUROLOGIC: No focal weakness or paresthesias are detected. SKIN: There are no ulcers or rashes noted. PSYCHIATRIC: The patient has a normal affect.  STUDIES:   I have reviewed the following: 1. No focal abnormality of the scapular bones. 2. No lung mass, consolidation, pleural effusion or pneumothorax. No suspicious lung nodule. 3. Fusiform aneurysmal dilation of descending thoracic aorta measuring up to 4.8 cm. Cardiovascular consultation is recommended. 4. Multiple other nonacute observations, as described above.  ASSESSMENT and PLAN   TAA:   the patient has incidental finding of a 4.8 cm descending thoracic aortic aneurysm.AA: I discussed the pathophysiology and natural history of aneurysmal disease.  I would recommend repair once the aneurysm becomes greater than 5 cm.  I will have him follow-up in 9 months with a CT angiogram.  I will also get carotid Doppler studies at that time.   Malvina New, IV, MD, FACS [facility] [phone] Pager [phone]      [1]  Allergies Allergen Reactions   Quinolones Other (See Comments)    Patient with TAA ( thoracic aortic aneurysm) so should avoid this class of antibiotic to avoid increase risk of dissection    Atenolol     bradycardia   Chantix [Varenicline]     Sedation    Hctz [Hydrochlorothiazide]     Bradycardia    Other     bandaid -rash   Telepaque [Iopanoic Acid] Rash   "

## 2024-08-06 ENCOUNTER — Other Ambulatory Visit: Payer: Self-pay

## 2024-08-06 DIAGNOSIS — I6523 Occlusion and stenosis of bilateral carotid arteries: Secondary | ICD-10-CM

## 2025-05-11 ENCOUNTER — Ambulatory Visit (HOSPITAL_COMMUNITY)

## 2025-05-11 ENCOUNTER — Ambulatory Visit: Admitting: Surgery
